# Patient Record
Sex: Male | Born: 1955 | Race: Black or African American | Hispanic: No | Marital: Married | Smoking: Former smoker
Health system: Southern US, Community
[De-identification: ages and names within clinical notes are randomized; demographics above are authoritative.]

## PROBLEM LIST (undated history)

## (undated) DIAGNOSIS — I219 Acute myocardial infarction, unspecified: Secondary | ICD-10-CM

## (undated) DIAGNOSIS — G4730 Sleep apnea, unspecified: Secondary | ICD-10-CM

## (undated) DIAGNOSIS — I10 Essential (primary) hypertension: Secondary | ICD-10-CM

## (undated) DIAGNOSIS — I499 Cardiac arrhythmia, unspecified: Secondary | ICD-10-CM

## (undated) DIAGNOSIS — I2510 Atherosclerotic heart disease of native coronary artery without angina pectoris: Secondary | ICD-10-CM

## (undated) DIAGNOSIS — K922 Gastrointestinal hemorrhage, unspecified: Secondary | ICD-10-CM

## (undated) DIAGNOSIS — I4892 Unspecified atrial flutter: Secondary | ICD-10-CM

## (undated) DIAGNOSIS — Z7901 Long term (current) use of anticoagulants: Secondary | ICD-10-CM

## (undated) DIAGNOSIS — I4891 Unspecified atrial fibrillation: Secondary | ICD-10-CM

## (undated) HISTORY — PX: CERVICAL FUSION: SHX112

## (undated) HISTORY — PX: TONSILLECTOMY: SUR1361

## (undated) HISTORY — DX: Long term (current) use of anticoagulants: Z79.01

## (undated) HISTORY — PX: CORONARY ARTERY BYPASS GRAFT: SHX141

## (undated) HISTORY — PX: BACK SURGERY: SHX140

## (undated) HISTORY — PX: CARDIAC SURGERY: SHX584

## (undated) HISTORY — DX: Unspecified atrial flutter: I48.92

---
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
## 2011-06-01 HISTORY — PX: CORONARY ARTERY BYPASS GRAFT: SHX141

## 2016-07-12 ENCOUNTER — Emergency Department (HOSPITAL_COMMUNITY)
Admission: EM | Admit: 2016-07-12 | Discharge: 2016-07-12 | Disposition: A | Discharge status: Home / Self Care | Payer: Non-veteran care | Attending: Emergency Medicine | Admitting: Emergency Medicine

## 2016-07-12 ENCOUNTER — Emergency Department (HOSPITAL_COMMUNITY): Payer: Non-veteran care

## 2016-07-12 ENCOUNTER — Encounter (HOSPITAL_COMMUNITY): Payer: Self-pay | Admitting: Emergency Medicine

## 2016-07-12 DIAGNOSIS — I4891 Unspecified atrial fibrillation: Secondary | ICD-10-CM | POA: Diagnosis not present

## 2016-07-12 DIAGNOSIS — I2510 Atherosclerotic heart disease of native coronary artery without angina pectoris: Secondary | ICD-10-CM | POA: Insufficient documentation

## 2016-07-12 DIAGNOSIS — F17200 Nicotine dependence, unspecified, uncomplicated: Secondary | ICD-10-CM | POA: Insufficient documentation

## 2016-07-12 DIAGNOSIS — R002 Palpitations: Secondary | ICD-10-CM | POA: Diagnosis present

## 2016-07-12 DIAGNOSIS — I10 Essential (primary) hypertension: Secondary | ICD-10-CM | POA: Diagnosis not present

## 2016-07-12 HISTORY — DX: Atherosclerotic heart disease of native coronary artery without angina pectoris: I25.10

## 2016-07-12 HISTORY — DX: Essential (primary) hypertension: I10

## 2016-07-12 HISTORY — DX: Unspecified atrial fibrillation: I48.91

## 2016-07-12 LAB — BASIC METABOLIC PANEL
Anion gap: 9 (ref 5–15)
BUN: 16 mg/dL (ref 6–20)
CHLORIDE: 107 mmol/L (ref 101–111)
CO2: 23 mmol/L (ref 22–32)
CREATININE: 1.23 mg/dL (ref 0.61–1.24)
Calcium: 9.2 mg/dL (ref 8.9–10.3)
GFR calc Af Amer: >60 mL/min (ref >60)
GFR calc non Af Amer: >60 mL/min (ref >60)
GLUCOSE: 129 mg/dL — AB (ref 65–99)
POTASSIUM: 4.1 mmol/L (ref 3.5–5.1)
SODIUM: 139 mmol/L (ref 135–145)

## 2016-07-12 LAB — I-STAT TROPONIN, ED: Troponin i, poc: 0 ng/mL (ref 0.00–0.08)

## 2016-07-12 LAB — CBC
HEMATOCRIT: 42.8 % (ref 39.0–52.0)
Hemoglobin: 14.5 g/dL (ref 13.0–17.0)
MCH: 30.1 pg (ref 26.0–34.0)
MCHC: 33.9 g/dL (ref 30.0–36.0)
MCV: 89 fL (ref 78.0–100.0)
PLATELETS: 178 10*3/uL (ref 150–400)
RBC: 4.81 MIL/uL (ref 4.22–5.81)
RDW: 14.4 % (ref 11.5–15.5)
WBC: 6.5 10*3/uL (ref 4.0–10.5)

## 2016-07-12 MED ORDER — PROPOFOL 10 MG/ML IV BOLUS: 0.5000 mg/kg | Freq: Once | INTRAVENOUS | Status: DC

## 2016-07-12 MED ORDER — PROPOFOL 10 MG/ML IV BOLUS
INTRAVENOUS | Status: AC | PRN
  Administered 2016-07-12: 10 mg via INTRAVENOUS

## 2016-07-12 MED ORDER — PROPOFOL 10 MG/ML IV BOLUS
INTRAVENOUS | Status: AC | PRN
  Administered 2016-07-12: 60 mg via INTRAVENOUS

## 2016-07-12 MED ORDER — DEXTROSE 5 % IV SOLN
10.0000 mg/h | Freq: Once | INTRAVENOUS | Status: AC
  Administered 2016-07-12: 10 mg/h via INTRAVENOUS
  Filled 2016-07-12 (×2): qty 100

## 2016-07-12 MED ORDER — DILTIAZEM LOAD VIA INFUSION
10.0000 mg | Freq: Once | INTRAVENOUS | Status: AC
  Administered 2016-07-12: 10 mg via INTRAVENOUS

## 2016-07-12 MED ORDER — PROPOFOL 10 MG/ML IV BOLUS
INTRAVENOUS | Status: AC
  Filled 2016-07-12: qty 20

## 2016-07-12 NOTE — ED Triage Notes (Signed)
Pt here for palpitations with afib this am; pt sts SOB and hx of same

## 2016-07-12 NOTE — Discharge Instructions (Signed)
Today you were found to be in atrial fibrillation and you were shocked back to a regular rhythm. Continue taking all your medications including your blood thinner. Return if you develop any shortness of breath, chest pain, swelling or other concerns. Return if you go back out of rhythm.

## 2016-07-12 NOTE — ED Notes (Signed)
Patient is completely back to baseline.  Will continue to monitor the patient and cardiac rhythm.

## 2016-07-12 NOTE — ED Provider Notes (Signed)
[facility] DEPT Provider Note   CSN: [id number] Arrival date & time: 07/12/16  1428     History   Chief Complaint Chief Complaint  Patient presents with  . Palpitations    HPI Cameron Schultz is a 61 y.o. male.  Patient is a 61 year old male with a history of hypertension, coronary artery disease and paroxysmal atrial fibrillation who is normally in a sinus rhythm but does take Xarelto daily presenting today with palpitations. Patient states about 10:30 when he got out of the shower he started feeling bad. He was lightheaded mildly short of breath which persisted throughout the day. He checked his heart rate when he started having symptoms and it was going between 50 and 150 and was irregular. Patient recently moved here from [place] and has had to go to the hospital twice in the past for cardioversion. Based on patient's statement he is converted with Cardizem both times and never required a shock. He denies any chest pain today, recent illness or change in medications. He has taken all of his medications today including Cardizem. He denies any unilateral leg pain or swelling.  Patient denies any drug use but does use tobacco   The history is provided by the patient and the spouse.    Past Medical History:  Diagnosis Date  . Atrial fibrillation (HCC)   . Coronary artery disease   . Hypertension     There are no active problems to display for this patient.   History reviewed. No pertinent surgical history.     Home Medications    Prior to Admission medications   Not on File    Family History History reviewed. No pertinent family history.  Social History Social History  Substance Use Topics  . Smoking status: Current Every Day Smoker  . Smokeless tobacco: Never Used  . Alcohol use No     Allergies   Patient has no known allergies.   Review of Systems Review of Systems  All other systems reviewed and are negative.    Physical Exam Updated Vital  Signs BP 110/89   Pulse 85   Temp 97.8 F (36.6 C) (Oral)   Resp 25   SpO2 94%   Physical Exam  Constitutional: He is oriented to person, place, and time. He appears well-developed and well-nourished. No distress.  HENT:  Head: Normocephalic and atraumatic.  Mouth/Throat: Oropharynx is clear and moist.  Eyes: Conjunctivae and EOM are normal. Pupils are equal, round, and reactive to light.  Neck: Normal range of motion. Neck supple.  Cardiovascular: Intact distal pulses.  An irregularly irregular rhythm present. Tachycardia present.   No murmur heard. Pulmonary/Chest: Effort normal and breath sounds normal. No respiratory distress. He has no wheezes. He has no rales.  Abdominal: Soft. He exhibits no distension. There is no tenderness. There is no rebound and no guarding.  Musculoskeletal: Normal range of motion. He exhibits no edema or tenderness.  No calf pain or swelling  Neurological: He is alert and oriented to person, place, and time.  Skin: Skin is warm and dry. No rash noted. No erythema.  Psychiatric: He has a normal mood and affect. His behavior is normal.  Nursing note and vitals reviewed.    ED Treatments / Results  Labs (all labs ordered are listed, but only abnormal results are displayed) Labs Reviewed  BASIC METABOLIC PANEL - Abnormal; Notable for the following:       Result Value   Glucose, Bld 129 (*)    All other  components within normal limits  CBC  I-STAT TROPOININ, ED    EKG  EKG Interpretation  Date/Time:  Monday July 12 2016 14:40:12 EST Ventricular Rate:  145 PR Interval:    QRS Duration: 84 QT Interval:  304 QTC Calculation: 472 R Axis:     Text Interpretation:  Atrial fibrillation with rapid ventricular response Inferior infarct , age undetermined Abnormal ECG No previous tracing Confirmed by Anitra LauthPLUNKETT  MD, Kaylub Detienne ([id number]) on 07/12/2016 5:51:00 PM       Radiology Dg Chest 2 View  Result Date: 07/12/2016 CLINICAL DATA:  Chest  palpitations and shortness of breath this morning. EXAM: CHEST  2 VIEW COMPARISON:  None. FINDINGS: The patient is status post CABG. Heart size is upper normal. Lungs are clear. No pneumothorax or pleural fluid. No bony abnormality. IMPRESSION: No acute disease. Electronically Signed   By: Drusilla Kannerhomas  Dalessio M.D.   On: 07/12/2016 15:09    Procedures .Cardioversion Date/Time: 07/12/2016 11:35 PM Performed by: Gwyneth SproutPLUNKETT, Deontae Robson Authorized by: Gwyneth SproutPLUNKETT, Drevion Offord   Consent:    Consent obtained:  Verbal and written   Consent given by:  Patient   Risks discussed:  Induced arrhythmia and pain   Alternatives discussed:  Rate-control medication Pre-procedure details:    Cardioversion basis:  Elective   Rhythm:  Atrial fibrillation   Electrode placement:  Anterior-lateral Attempt one:    Cardioversion mode:  Synchronous   Waveform:  Biphasic   Shock (Joules):  120   Shock outcome:  Conversion to normal sinus rhythm Post-procedure details:    Patient status:  Awake   Patient tolerance of procedure:  Tolerated well, no immediate complications   (including critical care time)  Medications Ordered in ED Medications  propofol (DIPRIVAN) 10 mg/mL bolus/IV push 62.4 mg (62.4 mg Intravenous Not Given 07/12/16 2100)  diltiazem (CARDIZEM) 100 mg in dextrose 5 % 100 mL (1 mg/mL) infusion (0 mg/hr Intravenous Stopped 07/12/16 2110)  diltiazem (CARDIZEM) 1 mg/mL load via infusion 10 mg (10 mg Intravenous Bolus from Bag 07/12/16 1943)  propofol (DIPRIVAN) 10 mg/mL bolus/IV push (60 mg Intravenous Given 07/12/16 2118)  propofol (DIPRIVAN) 10 mg/mL bolus/IV push (10 mg Intravenous Given 07/12/16 2119)  propofol (DIPRIVAN) 10 mg/mL bolus/IV push (10 mg Intravenous Given 07/12/16 2120)     Initial Impression / Assessment and Plan / ED Course  I have reviewed the triage vital signs and the nursing notes.  Pertinent labs & imaging results that were available during my care of the patient were reviewed by me and  considered in my medical decision making (see chart for details).    Patient is a 61 year old presenting today with A. fib RVR. Patient has a history of paroxysmal A. fib and is already anticoagulated. He has no other symptoms and has not had any exacerbating features such as change in medications or recent illness.  Upon arrival here patient is in atrial fibrillation with RVR with normal labs. He was given IV diltiazem however he would be a candidate for cardioversion. Patient does not have a cardiologist here in town as he recently moved and has not established care.  11:34 PM Patient had not missed any anticoagulation for the last month and speaking with cardiology they agree that he will be a good candidate for cardioversion. Patient was sedated with propofol and after 1 shock he returned to normal sinus rhythm. He is currently asymptomatic and will discharge home. Speaking with the cardiologist he [facility] and they will call him  for follow-up. Patient will continue taking his medications which include xarelto and cardizem/carvedilol.  Patient and family are comfortable with this plan and he was discharged home in good condition.  Procedural sedation Performed by: Gwyneth Sprout Consent: Verbal consent obtained. Risks and benefits: risks, benefits and alternatives were discussed Required items: required blood products, implants, devices, and special equipment available Patient identity confirmed: arm band and provided demographic data Time out: Immediately prior to procedure a "time out" was called to verify the correct patient, procedure, equipment, support staff and site/side marked as required.  Sedation type: moderate (conscious) sedation NPO time confirmed and considedered  Sedatives: PROPOFOL  Physician Time at Bedside: 35  Vitals: Vital signs were monitored during sedation. Cardiac Monitor, pulse oximeter Patient tolerance: Patient tolerated the procedure  well with no immediate complications. Comments: Pt with uneventful recovered. Returned to pre-procedural sedation baseline     Final Clinical Impressions(s) / ED Diagnoses   Final diagnoses:  Atrial fibrillation with RVR [facility])    New Prescriptions Discharge Medication List as of 07/12/2016 10:39 PM       Gwyneth Sprout, MD 07/13/16 [id number]

## 2016-07-12 NOTE — Progress Notes (Signed)
RT at bedside to assist with cardioversion.

## 2016-07-12 NOTE — ED Notes (Signed)
Pt denies CP

## 2016-07-13 ENCOUNTER — Telehealth (HOSPITAL_COMMUNITY): Payer: Self-pay | Admitting: *Deleted

## 2016-07-13 NOTE — Telephone Encounter (Signed)
Pt contacted following recent ED visit for Afib.  Pt has established hx of afib and anticoag.  [facility]  He is having all records transferred from [place].  Pt declines f/u at clinic at this time [facility]

## 2016-12-20 ENCOUNTER — Encounter (HOSPITAL_COMMUNITY): Payer: Self-pay | Admitting: *Deleted

## 2016-12-20 ENCOUNTER — Inpatient Hospital Stay (HOSPITAL_COMMUNITY)
Admission: EM | Admit: 2016-12-20 | Discharge: 2016-12-22 | DRG: 379 | Disposition: A | Discharge status: Home / Self Care | Payer: Non-veteran care | Attending: Family Medicine | Admitting: Family Medicine

## 2016-12-20 DIAGNOSIS — D649 Anemia, unspecified: Secondary | ICD-10-CM | POA: Diagnosis present

## 2016-12-20 DIAGNOSIS — Z981 Arthrodesis status: Secondary | ICD-10-CM

## 2016-12-20 DIAGNOSIS — Z951 Presence of aortocoronary bypass graft: Secondary | ICD-10-CM | POA: Diagnosis not present

## 2016-12-20 DIAGNOSIS — I2510 Atherosclerotic heart disease of native coronary artery without angina pectoris: Secondary | ICD-10-CM | POA: Diagnosis present

## 2016-12-20 DIAGNOSIS — M1990 Unspecified osteoarthritis, unspecified site: Secondary | ICD-10-CM | POA: Diagnosis present

## 2016-12-20 DIAGNOSIS — I10 Essential (primary) hypertension: Secondary | ICD-10-CM | POA: Diagnosis present

## 2016-12-20 DIAGNOSIS — K254 Chronic or unspecified gastric ulcer with hemorrhage: Secondary | ICD-10-CM | POA: Diagnosis present

## 2016-12-20 DIAGNOSIS — Z7901 Long term (current) use of anticoagulants: Secondary | ICD-10-CM

## 2016-12-20 DIAGNOSIS — K922 Gastrointestinal hemorrhage, unspecified: Secondary | ICD-10-CM

## 2016-12-20 DIAGNOSIS — M549 Dorsalgia, unspecified: Secondary | ICD-10-CM | POA: Diagnosis present

## 2016-12-20 DIAGNOSIS — K921 Melena: Secondary | ICD-10-CM | POA: Diagnosis present

## 2016-12-20 DIAGNOSIS — Z7982 Long term (current) use of aspirin: Secondary | ICD-10-CM

## 2016-12-20 DIAGNOSIS — I4891 Unspecified atrial fibrillation: Secondary | ICD-10-CM | POA: Diagnosis present

## 2016-12-20 DIAGNOSIS — E785 Hyperlipidemia, unspecified: Secondary | ICD-10-CM | POA: Diagnosis present

## 2016-12-20 DIAGNOSIS — Z87891 Personal history of nicotine dependence: Secondary | ICD-10-CM | POA: Diagnosis not present

## 2016-12-20 DIAGNOSIS — K5900 Constipation, unspecified: Secondary | ICD-10-CM | POA: Diagnosis present

## 2016-12-20 HISTORY — DX: Gastrointestinal hemorrhage, unspecified: K92.2

## 2016-12-20 LAB — CBC
HCT: 36.3 % — ABNORMAL LOW (ref 39.0–52.0)
Hemoglobin: 12 g/dL — ABNORMAL LOW (ref 13.0–17.0)
MCH: 29.1 pg (ref 26.0–34.0)
MCHC: 33.1 g/dL (ref 30.0–36.0)
MCV: 87.9 fL (ref 78.0–100.0)
PLATELETS: 198 10*3/uL (ref 150–400)
RBC: 4.13 MIL/uL — ABNORMAL LOW (ref 4.22–5.81)
RDW: 14.5 % (ref 11.5–15.5)
WBC: 6.4 10*3/uL (ref 4.0–10.5)

## 2016-12-20 LAB — RAPID URINE DRUG SCREEN, HOSP PERFORMED
Amphetamines: NOT DETECTED
BARBITURATES: NOT DETECTED
Benzodiazepines: NOT DETECTED
Cocaine: NOT DETECTED
OPIATES: NOT DETECTED
TETRAHYDROCANNABINOL: NOT DETECTED

## 2016-12-20 LAB — COMPREHENSIVE METABOLIC PANEL
ALBUMIN: 3.7 g/dL (ref 3.5–5.0)
ALK PHOS: 74 U/L (ref 38–126)
ALT: 29 U/L (ref 17–63)
AST: 29 U/L (ref 15–41)
Anion gap: 8 (ref 5–15)
BILIRUBIN TOTAL: 0.9 mg/dL (ref 0.3–1.2)
BUN: 10 mg/dL (ref 6–20)
CALCIUM: 8.7 mg/dL — AB (ref 8.9–10.3)
CO2: 22 mmol/L (ref 22–32)
CREATININE: 0.9 mg/dL (ref 0.61–1.24)
Chloride: 108 mmol/L (ref 101–111)
GFR calc Af Amer: >60 mL/min (ref >60)
GFR calc non Af Amer: >60 mL/min (ref >60)
GLUCOSE: 92 mg/dL (ref 65–99)
Potassium: 3.9 mmol/L (ref 3.5–5.1)
Sodium: 138 mmol/L (ref 135–145)
Total Protein: 7 g/dL (ref 6.5–8.1)

## 2016-12-20 LAB — PROTIME-INR
INR: 1.16
PROTHROMBIN TIME: 14.9 s (ref 11.4–15.2)

## 2016-12-20 LAB — TYPE AND SCREEN
ABO/RH(D): O POS
ANTIBODY SCREEN: NEGATIVE

## 2016-12-20 LAB — ABO/RH: ABO/RH(D): O POS

## 2016-12-20 LAB — APTT: aPTT: 39 seconds — ABNORMAL HIGH (ref 24–36)

## 2016-12-20 LAB — POC OCCULT BLOOD, ED: Fecal Occult Bld: POSITIVE — AB

## 2016-12-20 MED ORDER — SODIUM CHLORIDE 0.9 % IV SOLN
INTRAVENOUS | Status: DC
  Administered 2016-12-20 – 2016-12-21 (×2): via INTRAVENOUS

## 2016-12-20 MED ORDER — SODIUM CHLORIDE 0.9 % IV SOLN
8.0000 mg/h | INTRAVENOUS | Status: DC
  Administered 2016-12-20 – 2016-12-21 (×2): 8 mg/h via INTRAVENOUS
  Filled 2016-12-20 (×3): qty 80

## 2016-12-20 MED ORDER — DILTIAZEM HCL ER COATED BEADS 120 MG PO CP24
120.0000 mg | ORAL_CAPSULE | Freq: Every day | ORAL | Status: DC
  Administered 2016-12-21 – 2016-12-22 (×2): 120 mg via ORAL
  Filled 2016-12-20 (×2): qty 1

## 2016-12-20 MED ORDER — POLYETHYLENE GLYCOL 3350 17 G PO PACK: 17.0000 g | PACK | Freq: Every day | ORAL | Status: DC | PRN

## 2016-12-20 MED ORDER — SODIUM CHLORIDE 0.9 % IV SOLN: INTRAVENOUS | Status: DC

## 2016-12-20 MED ORDER — SODIUM CHLORIDE 0.9 % IV SOLN
80.0000 mg | Freq: Once | INTRAVENOUS | Status: AC
  Administered 2016-12-20: 18:00:00 80 mg via INTRAVENOUS
  Filled 2016-12-20: qty 80

## 2016-12-20 MED ORDER — ONDANSETRON HCL 4 MG/2ML IJ SOLN: 4.0000 mg | Freq: Four times a day (QID) | INTRAMUSCULAR | Status: DC | PRN

## 2016-12-20 MED ORDER — CYCLOBENZAPRINE HCL 10 MG PO TABS
10.0000 mg | ORAL_TABLET | Freq: Three times a day (TID) | ORAL | Status: DC | PRN
  Administered 2016-12-21 (×2): 10 mg via ORAL
  Filled 2016-12-20 (×2): qty 1

## 2016-12-20 MED ORDER — ACETAMINOPHEN 650 MG RE SUPP: 650.0000 mg | Freq: Four times a day (QID) | RECTAL | Status: DC | PRN

## 2016-12-20 MED ORDER — LISINOPRIL 40 MG PO TABS
40.0000 mg | ORAL_TABLET | Freq: Every day | ORAL | Status: DC
  Administered 2016-12-21 – 2016-12-22 (×2): 40 mg via ORAL
  Filled 2016-12-20 (×2): qty 1

## 2016-12-20 MED ORDER — ONDANSETRON HCL 4 MG PO TABS: 4.0000 mg | ORAL_TABLET | Freq: Four times a day (QID) | ORAL | Status: DC | PRN

## 2016-12-20 MED ORDER — CARVEDILOL 25 MG PO TABS
25.0000 mg | ORAL_TABLET | Freq: Two times a day (BID) | ORAL | Status: DC
  Administered 2016-12-21 – 2016-12-22 (×3): 25 mg via ORAL
  Filled 2016-12-20 (×3): qty 1

## 2016-12-20 MED ORDER — ROSUVASTATIN CALCIUM 20 MG PO TABS
20.0000 mg | ORAL_TABLET | Freq: Every day | ORAL | Status: DC
  Administered 2016-12-21: 20 mg via ORAL
  Filled 2016-12-20: qty 1

## 2016-12-20 MED ORDER — ACETAMINOPHEN 325 MG PO TABS
650.0000 mg | ORAL_TABLET | Freq: Four times a day (QID) | ORAL | Status: DC | PRN
  Administered 2016-12-21 (×2): 650 mg via ORAL
  Filled 2016-12-20 (×2): qty 2

## 2016-12-20 NOTE — Progress Notes (Signed)
Received report from Casey,RN in the ED. 

## 2016-12-20 NOTE — Progress Notes (Signed)
--   No charge note/ patient has not been seen --  - Received page from ER regarding patient for melena of 3 days' duration.  hemoglobin is 12.Normal BUN. Normal blood pressure.   He is currently on anticoagulation. Last dose of Xarelto  was yesterday. - Recommend admission to the hospital. Recommend nothing by mouth past midnight. Continue Protonix drip. - GI will see patient tomorrow morning. Please call us back early if any significant clinical changes.   Kathi DerParag Madalyn Legner MD, FACP 12/20/2016, 4:36 PM  Pager [phone]  If no answer or after 5 PM call [phone]

## 2016-12-20 NOTE — ED Triage Notes (Signed)
Pt is here with black bowel movements and is on blood thinners, xarelto. Pt reports some upper abdominal discomfort. Started on saturday

## 2016-12-20 NOTE — ED Provider Notes (Signed)
[facility] Provider Note   CSN: [id number] Arrival date & time: 12/20/16  1212     History   Chief Complaint Chief Complaint  Patient presents with  . GI Bleeding     HPI   Blood pressure (!) 126/96, pulse (!) 51, temperature 97.7 F (36.5 C), temperature source Oral, resp. rate (!) 108, height 6\' 2"  (1.88 m), weight 128.4 kg (283 lb), SpO2 100 %.  Cameron Schultz is a 61 y.o. male with past medical history significant for CAD (takes daily 81 mg aspirin, last dose this a.m.), hypertension (does not take beta blockers), A. fib (takes her alto, last dose yesterday morning) complaining of irregular melanotic stool onset 3 days ago. States he normally is quite regular but he's been having small and irregular bowel movements described as black and tarry. He denies any abdominal pain, nausea, vomiting, chest pain, shortness of breath, palpitations or syncope. He does not follow with a GI doctor. His last colonoscopy was a proximally 5 years ago in [place]. He doesn't drink alcohol, he was told once that he had hepatitis C but she's been tested subsequently and has tested negative without treatment. He denies any abdominal pain.   [facility]  Past Medical History:  Diagnosis Date  . Atrial fibrillation (HCC)   . Coronary artery disease   . Hypertension     There are no active problems to display for this patient.   Past Surgical History:  Procedure Laterality Date  . CARDIAC SURGERY    . CERVICAL FUSION    . TONSILLECTOMY         Home Medications    Prior to Admission medications   Medication Sig Start Date End Date Taking? Authorizing Provider  acetaminophen (TYLENOL) 500 MG chewable tablet Chew 1,000 mg by mouth every 6 (six) hours as needed for pain.   Yes [provider]  aspirin EC 81 MG tablet Take 81 mg by mouth daily.   Yes [provider]  carvedilol (COREG) 25 MG tablet Take 25 mg by mouth 2 (two) times daily with a meal.    Yes [provider]  cyclobenzaprine (FLEXERIL) 10 MG tablet Take 10 mg by mouth 3 (three) times daily as needed for muscle spasms.   Yes [provider]  diltiazem (DILTIAZEM CD) 120 MG 24 hr capsule Take 120 mg by mouth daily.    Yes [provider]  lactulose, encephalopathy, (CHRONULAC) 10 GM/15ML SOLN Take 10 g by mouth daily as needed (Constipation).   Yes [provider]  lisinopril (PRINIVIL,ZESTRIL) 40 MG tablet Take 40 mg by mouth daily.   Yes [provider]  PRESCRIPTION MEDICATION Inhale into the lungs at bedtime. CPAP   Yes [provider]  rivaroxaban (XARELTO) 20 MG TABS tablet Take 20 mg by mouth daily with breakfast.    Yes [provider]  Rosuvastatin Calcium (CRESTOR PO) Take 20 mg by mouth daily.    Yes [provider]  sildenafil (VIAGRA) 100 MG tablet Take 100 mg by mouth daily as needed for erectile dysfunction.   Yes [provider]  triamcinolone cream (KENALOG) 0.1 % Apply 1 application topically 2 (two) times daily. rash   Yes [provider]    Family History No family history on file.  Social History Social History  Substance Use Topics  . Smoking status: Current Every Day Smoker  . Smokeless tobacco: Never Used  . Alcohol use No     Allergies   Patient  has no known allergies.   Review of Systems Review of Systems  A complete review of systems was obtained and all systems are negative except as noted in the HPI and PMH.   Physical Exam Updated Vital Signs BP 121/62   Pulse (!) 57   Temp 97.7 F (36.5 C) (Oral)   Resp 17   Ht 6\' 2"  (1.88 m)   Wt 128.4 kg (283 lb)   SpO2 99%   BMI 36.34 kg/m   Physical Exam  Constitutional: He is oriented to person, place, and time. He appears well-developed and well-nourished. No distress.  HENT:  Head: Normocephalic and atraumatic.  Mouth/Throat: Oropharynx is clear and moist.  No significant conjunctival pallor  Eyes: Pupils  are equal, round, and reactive to light. Conjunctivae and EOM are normal.  Neck: Normal range of motion.  Cardiovascular: Normal rate, regular rhythm and intact distal pulses.   Pulmonary/Chest: Effort normal and breath sounds normal.  Abdominal: Soft. There is no tenderness.  Musculoskeletal: Normal range of motion.  Neurological: He is alert and oriented to person, place, and time.  Skin: He is not diaphoretic.  Psychiatric: He has a normal mood and affect.  Nursing note and vitals reviewed.    ED Treatments / Results  Labs (all labs ordered are listed, but only abnormal results are displayed) Labs Reviewed  COMPREHENSIVE METABOLIC PANEL - Abnormal; Notable for the following:       Result Value   Calcium 8.7 (*)    All other components within normal limits  CBC - Abnormal; Notable for the following:    RBC 4.13 (*)    Hemoglobin 12.0 (*)    HCT 36.3 (*)    All other components within normal limits  APTT - Abnormal; Notable for the following:    aPTT 39 (*)    All other components within normal limits  POC OCCULT BLOOD, ED - Abnormal; Notable for the following:    Fecal Occult Bld POSITIVE (*)    All other components within normal limits  PROTIME-INR  TYPE AND SCREEN  ABO/RH    EKG  EKG Interpretation None       Radiology No results found.  Procedures Procedures (including critical care time)  Medications Ordered in ED Medications  pantoprazole (PROTONIX) 80 mg in sodium chloride 0.9 % 100 mL IVPB (not administered)  pantoprazole (PROTONIX) 80 mg in sodium chloride 0.9 % 250 mL (0.32 mg/mL) infusion (not administered)  0.9 %  sodium chloride infusion (not administered)     Initial Impression / Assessment and Plan / ED Course  I have reviewed the triage vital signs and the nursing notes.  Pertinent labs & imaging results that were available during my care of the patient were reviewed by me and considered in my medical decision making (see chart for  details).     Vitals:   12/20/16 1655 12/20/16 1700 12/20/16 1730 12/20/16 1800  BP: 135/76 134/75 122/82 121/62  Pulse: (!) 57 (!) 56 (!) 57 (!) 57  Resp: 16 15 17 17   Temp:      TempSrc:      SpO2: 100% 100% 100% 99%  Weight:      Height:        Medications  pantoprazole (PROTONIX) 80 mg in sodium chloride 0.9 % 100 mL IVPB (not administered)  pantoprazole (PROTONIX) 80 mg in sodium chloride 0.9 % 250 mL (0.32 mg/mL) infusion (not administered)  0.9 %  sodium chloride infusion (not administered)  Chuckie Mccathern is 61 y.o. male presenting with Melanotic stool onset 3 days ago. Patient is anticoagulated with Xarelto for A. fib, last dose was yesterday morning. He also takes a daily low-dose aspirin. Abdominal exam is benign. Vital signs reassuring. Patient does not take a beta blocker. Hemoglobin 12. BUN is not elevated however patient has grossly melanotic stool that is fecal occult positive.  Gastroenterology consult from [facility] Dr. Levora Angel appreciated: she states that patient can have clear liquid diet today, cannot do an endoscopy today because he needs to not have Xarelto in the last 48 hours. Advised to make him nothing by mouth after midnight and continue Protonix drip.  Unassigned admission, discussed with family practice Resident Primitivo Gauze who accepts admission.    Final Clinical Impressions(s) / ED Diagnoses   Final diagnoses:  Acute upper GI bleed  Chronic anticoagulation    New Prescriptions New Prescriptions   No medications on file     Lynetta Mare Mardella Layman 12/20/16 Boykin Reaper, MD 12/20/16 [phone]

## 2016-12-20 NOTE — H&P (Signed)
Family Medicine Teaching [facility] Admission History and Physical Service Pager: [phone]  Patient name: Cameron Schultz Medical record number: [id number] Date of birth: September 05, 1955 Age: 61 y.o. Gender: male  Primary Care Provider: System, Pcp Not In Consultants: GI Code Status: Full (per discussion on admission)   Chief Complaint: melanous stools  Assessment and Plan: Cameron Schultz is a 61 y.o. male presenting with melanous stools . PMH is significant for A. fib, hypertension, arthritis, CAD, CABG with 1 vessel, and history of tobacco and drug abuse  GI bleed: Hemodynamically stable on admission with pulse of 65, BP 131/58, respiratory rate of 18 with 100% oxygen on room air. Hemoglobin only slightly below normal at 12. FOBT positive. Likely upper GI bleed given the history of melenous stools, no red blood per rectum or hematemesis. Patient does take Xarelto and aspirin for A. fib and CAD which would make him more prone to bleeding. Did not take Xarelto this morning. No personal or family history colon cancer or gastric ulcers per patient report. His brother did pass away at 46 from "dark stools".  -Admit to inpatient, MedSurg, attending Dr. Jennette KettleNeal -Vital signs per floor protocol -Clear liquid diet for now and then nothing by mouth after midnight -IV Protonix -Repeat CBC in the morning -Hold all anticoagulation, SCDs for DVT prophylaxis -Transfusion threshold hemoglobin <8 due to cardiac history -GI consulted, appreciate their assistance. They will see patient in the morning  A-Fib: Currently normal sinus rhythm. Has been taking xarelto for ~ 2 years.  -Continue home diltiazem 120 mg daily -Holding all anticoagulation as above  Hypertension: Normotensive on admission -Continue home lisinopril 40 mg daily -Continue home carvedilol 25 mg twice a day  History of tobacco and substance abuse: Patient states he recently quit smoking cigarettes, alcohol, and cocaine 4 months  ago. -UDS  History of CABG:  -Continue home antihypertensives as above -Holding aspirin as above  -continue home statin  Back pain: Patient notes that he has had a cervical spine fusion -Continue home Flexeril  FEN/GI: mIVF at 125cc/hr, clear liquid diet  Prophylaxis: SCDs  Disposition: Admit to inpatient, med surg, attending Dr. Jennette KettleNeal  History of Present Illness:  Cameron Schultz is a 61 y.o. male presenting with melanous bowel movements.   Patient notes that over the last 3 days he developed black tarry stools. He had only 1 today. No nausea, vomiting, abdominal pain, diarrhea, heartburn, dyspepsia. Admits to some constipation and has been taking a laxative (lactulose) PRN. Patient denies any fevers, chills, diaphoresis. He is on Xarelto and has been on this for about 2 years, he is taking it for A-fib. He has not taken his Xarelto today because he was worried that it was causing him to bleed, the last time he took it was yesterday morning. He takes ASA daily. Patient states he has had a colonoscopy before at the [facility] in [place], this was around 3-4 years ago and he states it was normal.  At baseline, patient performs all his ADLs and IADLs independently.   Review Of Systems: Per HPI with the following additions: see HPI  ROS  Patient Active Problem List   Diagnosis Date Noted  . GI bleed 12/20/2016    Past Medical History: Past Medical History:  Diagnosis Date  . Atrial fibrillation (HCC)   . Coronary artery disease   . Hypertension   CABG 5 years ago- 1 vessel  Arthritis Hyperlipidemia  Past Surgical History: Past Surgical History:  Procedure Laterality Date  . CARDIAC SURGERY    .  CERVICAL FUSION    . TONSILLECTOMY      Social History: Quit smoking April 2018, before that he had been smoking for 44 years (1 PPD)  States he drank alcohol but stopped in April  Previously used cocaine about 4 months ago  Please also refer to relevant sections of EMR.  Family  History: No family history on file. Brother had upper GI bleed and died from this at age 19  Allergies and Medications: No Known Allergies No current facility-administered medications on file prior to encounter.    Current Outpatient Prescriptions on File Prior to Encounter  Medication Sig Dispense Refill  . aspirin EC 81 MG tablet Take 81 mg by mouth daily.    . carvedilol (COREG) 25 MG tablet Take 25 mg by mouth 2 (two) times daily with a meal.     . diltiazem (DILTIAZEM CD) 120 MG 24 hr capsule Take 120 mg by mouth daily.     Marland Kitchen lisinopril (PRINIVIL,ZESTRIL) 40 MG tablet Take 40 mg by mouth daily.    Marland Kitchen PRESCRIPTION MEDICATION Inhale into the lungs at bedtime. CPAP    . rivaroxaban (XARELTO) 20 MG TABS tablet Take 20 mg by mouth daily with breakfast.     . Rosuvastatin Calcium (CRESTOR PO) Take 20 mg by mouth daily.       Objective: BP (!) 131/58 (BP Location: Left Arm)   Pulse 65   Temp 98.3 F (36.8 C) (Oral)   Resp 18   Ht 6\' 2"  (1.88 m)   Wt 283 lb (128.4 kg)   SpO2 100%   BMI 36.34 kg/m  Exam: General: In no acute distress, sitting up in bed watching TV Eyes: Pupils equal and reactive to light, nonicteric sclera ENTM: Moist mucous membranes, no lymphadenopathy Neck: Supple Cardiovascular: Regular rate and rhythm, normal S1-S2, no murmurs appreciated, 2+ DP pulses bilaterally, no edema Respiratory: Normal work of breathing, clear to auscultation bilaterally Gastrointestinal: Soft, obese abdomen, nontender, normal bowel sounds MSK: Moves all extremities spontaneously Derm: No rash Neuro: Alert and oriented 3, cranial nerves II through XII intact, sensation grossly intact throughout, 5 out of 5 strength in bilateral upper and lower extremities Psych: Normal mood and affect  Labs and Imaging: CBC BMET   Recent Labs Lab 12/20/16 1235  WBC 6.4  HGB 12.0*  HCT 36.3*  PLT 198    Recent Labs Lab 12/20/16 1235  NA 138  K 3.9  CL 108  CO2 22  BUN 10   CREATININE 0.90  GLUCOSE 92  CALCIUM 8.7*      Beaulah Dinning, MD 12/20/2016, 10:28 PM PGY-3, [facility] FPTS Intern pager: [phone], text pages welcome

## 2016-12-20 NOTE — ED Notes (Signed)
Attempted report x1. 

## 2016-12-21 ENCOUNTER — Inpatient Hospital Stay (HOSPITAL_COMMUNITY): Payer: Non-veteran care | Admitting: Anesthesiology

## 2016-12-21 ENCOUNTER — Encounter (HOSPITAL_COMMUNITY)
Admission: EM | Disposition: A | Discharge status: Home / Self Care | Payer: Self-pay | Source: Home / Self Care | Attending: Family Medicine

## 2016-12-21 ENCOUNTER — Encounter (HOSPITAL_COMMUNITY): Payer: Self-pay

## 2016-12-21 HISTORY — PX: ESOPHAGOGASTRODUODENOSCOPY (EGD) WITH PROPOFOL: SHX5813

## 2016-12-21 LAB — BASIC METABOLIC PANEL
ANION GAP: 4 — AB (ref 5–15)
BUN: 7 mg/dL (ref 6–20)
CHLORIDE: 109 mmol/L (ref 101–111)
CO2: 27 mmol/L (ref 22–32)
Calcium: 8.4 mg/dL — ABNORMAL LOW (ref 8.9–10.3)
Creatinine, Ser: 0.89 mg/dL (ref 0.61–1.24)
GFR calc Af Amer: >60 mL/min (ref >60)
GFR calc non Af Amer: >60 mL/min (ref >60)
GLUCOSE: 88 mg/dL (ref 65–99)
POTASSIUM: 3.6 mmol/L (ref 3.5–5.1)
Sodium: 140 mmol/L (ref 135–145)

## 2016-12-21 LAB — HIV ANTIBODY (ROUTINE TESTING W REFLEX): HIV SCREEN 4TH GENERATION: NONREACTIVE

## 2016-12-21 LAB — CBC
HEMATOCRIT: 33.5 % — AB (ref 39.0–52.0)
HEMOGLOBIN: 11.4 g/dL — AB (ref 13.0–17.0)
MCH: 30 pg (ref 26.0–34.0)
MCHC: 34 g/dL (ref 30.0–36.0)
MCV: 88.2 fL (ref 78.0–100.0)
Platelets: 172 10*3/uL (ref 150–400)
RBC: 3.8 MIL/uL — ABNORMAL LOW (ref 4.22–5.81)
RDW: 14.5 % (ref 11.5–15.5)
WBC: 5.1 10*3/uL (ref 4.0–10.5)

## 2016-12-21 LAB — PROTIME-INR
INR: 1.23
Prothrombin Time: 15.6 seconds — ABNORMAL HIGH (ref 11.4–15.2)

## 2016-12-21 LAB — APTT: aPTT: 34 seconds (ref 24–36)

## 2016-12-21 SURGERY — ESOPHAGOGASTRODUODENOSCOPY (EGD) WITH PROPOFOL
Anesthesia: Monitor Anesthesia Care

## 2016-12-21 MED ORDER — FENTANYL CITRATE (PF) 100 MCG/2ML IJ SOLN: 25.0000 ug | INTRAMUSCULAR | Status: DC | PRN

## 2016-12-21 MED ORDER — LACTATED RINGERS IV SOLN
INTRAVENOUS | Status: DC
  Administered 2016-12-21: 1000 mL via INTRAVENOUS

## 2016-12-21 MED ORDER — PANTOPRAZOLE SODIUM 40 MG PO TBEC
40.0000 mg | DELAYED_RELEASE_TABLET | Freq: Every day | ORAL | Status: DC
  Administered 2016-12-21 – 2016-12-22 (×2): 40 mg via ORAL
  Filled 2016-12-21 (×2): qty 1

## 2016-12-21 MED ORDER — ONDANSETRON HCL 4 MG/2ML IJ SOLN: 4.0000 mg | Freq: Once | INTRAMUSCULAR | Status: DC | PRN

## 2016-12-21 MED ORDER — PROPOFOL 500 MG/50ML IV EMUL
INTRAVENOUS | Status: DC | PRN
  Administered 2016-12-21: 150 ug/kg/min via INTRAVENOUS

## 2016-12-21 MED ORDER — RIVAROXABAN 20 MG PO TABS
20.0000 mg | ORAL_TABLET | Freq: Every day | ORAL | Status: DC
  Administered 2016-12-21 – 2016-12-22 (×2): 20 mg via ORAL
  Filled 2016-12-21 (×2): qty 1

## 2016-12-21 MED ORDER — BUTAMBEN-TETRACAINE-BENZOCAINE 2-2-14 % EX AERO
INHALATION_SPRAY | CUTANEOUS | Status: DC | PRN
  Administered 2016-12-21: 2 via TOPICAL

## 2016-12-21 MED ORDER — PROPOFOL 10 MG/ML IV BOLUS
INTRAVENOUS | Status: DC | PRN
  Administered 2016-12-21: 20 mg via INTRAVENOUS
  Administered 2016-12-21: 70 mg via INTRAVENOUS

## 2016-12-21 MED ORDER — SODIUM CHLORIDE 0.9 % IV SOLN: INTRAVENOUS | Status: DC

## 2016-12-21 SURGICAL SUPPLY — 14 items

## 2016-12-21 NOTE — Discharge Summary (Signed)
Family Medicine Teaching [facility] Discharge Summary  Patient name: Cameron Schultz Medical record number: [id number] Date of birth: [birth year] Age: 61 y.o. Gender: male Date of Admission: 12/20/2016  Date of Discharge: 12/22/2016 Admitting Physician: Nestor Ramp, MD  Primary Care Provider: System, Pcp Not In Consultants: GI  Indication for Hospitalization: GI bleed   Discharge Diagnoses/Problem List:  GI bleed A fib HTN History of tobacco and substance abuse History of CABG Back pain  Disposition: to home   Discharge Condition: stable, improved   Discharge Exam:  General: awake and alert, NAD Cardiovascular: RRR, no MRG Respiratory: CTAB, no wheezes, rales, or rhonchi  Abdomen: soft, non tender, non distended, bowel sounds x 4 quadrants  Extremities: no edema, non tenderness   Brief Hospital Course:  Cameron Schultz is a 62 y.o. male presenting with melanous stools . PMH is significant for A. fib, hypertension, arthritis, CAD, CABG with 1 vessel, and history of tobacco and drug abuse. Patient has history of 2 year Xarelto use along with aspirin for history of A fib and CAD, making patient more prone to bleeding. On admission hemoglobin slightly below normal at 12, and remained stable throughout admission with hemoglobin on discharge of 10.6 Patient remained hemodynamically stable throughout admission. GI was consulted and performed an EGD. EGD on 7/24 showing non bleeding gastric ulcer, normal duodenal bulb, first portion of the duodenum and second portion of the duodenum, and gastritis that was biopsied. GI recommendations to start cardiac diet and resume Xarelto. Patient transitioned from IV to oral protonix. Patient has history of A fib but was in normal sinus rhythm on admission. Home diltiazem was continued and Xarelto was re started per GI recommendations. Patient has history of HTN but was normotensive on admission. Home medications of lisinopril and carvedilol were  continued. Patient has history of cervical spine fusion causing back pain, but this was well controlled with home flexeril dose and tylenol prn. Patient was improved on discharge. Denied SOB, weakness, fatigue, or chest pain. Patient desired to be discharged.   Issues for Follow Up:  1. Will need hospital follow up for GI bleed with PCP  2. Follow up with GI for pathology results from EGD 3. EGD on 7/24 showing non bleeding gastric ulcer, normal duodenal bulb, first portion of the duodenum and second portion of the duodenum, and gastritis that was biopsied.  4. Have started oral protonix 40 mg daily  Significant Procedures: EGD   Significant Labs and Imaging:   Recent Labs Lab 12/20/16 1235 12/21/16 0600 12/22/16 0359  WBC 6.4 5.1 5.8  HGB 12.0* 11.4* 10.6*  HCT 36.3* 33.5* 32.0*  PLT 198 172 184    Recent Labs Lab 12/20/16 1235 12/21/16 0600 12/22/16 0359  NA 138 140 139  K 3.9 3.6 3.5  CL 108 109 108  CO2 22 27 24   GLUCOSE 92 88 94  BUN 10 7 9   CREATININE 0.90 0.89 0.92  CALCIUM 8.7* 8.4* 8.3*  ALKPHOS 74  --   --   AST 29  --   --   ALT 29  --   --   ALBUMIN 3.7  --   --     Drugs of Abuse     Component Value Date/Time   LABOPIA NONE DETECTED 12/20/2016 2002   COCAINSCRNUR NONE DETECTED 12/20/2016 2002   LABBENZ NONE DETECTED 12/20/2016 2002   AMPHETMU NONE DETECTED 12/20/2016 2002   THCU NONE DETECTED 12/20/2016 2002   LABBARB NONE DETECTED 12/20/2016 2002  Results/Tests Pending at Time of Discharge: none  Discharge Medications:  Allergies as of 12/22/2016   No Known Allergies     Medication List    TAKE these medications   acetaminophen 500 MG chewable tablet Commonly known as:  TYLENOL Chew 1,000 mg by mouth every 6 (six) hours as needed for pain.   aspirin EC 81 MG tablet Take 81 mg by mouth daily.   carvedilol 25 MG tablet Commonly known as:  COREG Take 25 mg by mouth 2 (two) times daily with a meal.   CRESTOR PO Take 20 mg by  mouth daily.   cyclobenzaprine 10 MG tablet Commonly known as:  FLEXERIL Take 10 mg by mouth 3 (three) times daily as needed for muscle spasms.   DILTIAZEM CD 120 MG 24 hr capsule Generic drug:  diltiazem Take 120 mg by mouth daily.   lactulose (encephalopathy) 10 GM/15ML Soln Commonly known as:  CHRONULAC Take 10 g by mouth daily as needed (Constipation).   lisinopril 40 MG tablet Commonly known as:  PRINIVIL,ZESTRIL Take 40 mg by mouth daily.   pantoprazole 40 MG tablet Commonly known as:  PROTONIX Take 1 tablet (40 mg total) by mouth daily.   PRESCRIPTION MEDICATION Inhale into the lungs at bedtime. CPAP   sildenafil 100 MG tablet Commonly known as:  VIAGRA Take 100 mg by mouth daily as needed for erectile dysfunction.   triamcinolone cream 0.1 % Commonly known as:  KENALOG Apply 1 application topically 2 (two) times daily. rash   XARELTO 20 MG Tabs tablet Generic drug:  rivaroxaban Take 20 mg by mouth daily with breakfast.       Discharge Instructions: Please refer to Patient Instructions section of EMR for full details.  Patient was counseled important signs and symptoms that should prompt return to medical care, changes in medications, dietary instructions, activity restrictions, and follow up appointments.   Follow-Up Appointments: Follow-up Information    [facility] day([facility]   Specialty:  General Practice Why:  [facility] Contact information: [address] [place] [phone]           Oralia ManisAbraham, Gabbi Whetstone, DO 12/22/2016, 10:20 PM PGY-1, [facility]

## 2016-12-21 NOTE — Op Note (Signed)
[facility] Patient Name: Cameron PloverRichard Schultz Procedure Date : 12/21/2016 MRN: [id number] Attending MD: Kathi DerParag Osa Fogarty , MD Date of Birth: 06/18/1955 CSN: [id number] Age: 61 Admit Type: Inpatient Procedure:                Upper GI endoscopy Indications:              Melena Providers:                Kathi DerParag Mykelle Cockerell, MD, Tillie Fantasiaonna Pickering, RN, Beryle BeamsJanie                            Billups, Technician, Kirstie MirzaSamantha Turner Referring MD:              Medicines:                Sedation Administered by an Anesthesia Professional Complications:            No immediate complications. Estimated Blood Loss:     Estimated blood loss was minimal. Procedure:                Pre-Anesthesia Assessment:                           - Prior to the procedure, a History and Physical                            was performed, and patient medications and                            allergies were reviewed. The patient's tolerance of                            previous anesthesia was also reviewed. The risks                            and benefits of the procedure and the sedation                            options and risks were discussed with the patient.                            All questions were answered, and informed consent                            was obtained. Prior Anticoagulants: The patient has                            taken Xarelto (rivaroxaban), last dose was 2 days                            prior to procedure. ASA Grade Assessment: III - A                            patient with severe systemic disease. After  reviewing the risks and benefits, the patient was                            deemed in satisfactory condition to undergo the                            procedure.                           After obtaining informed consent, the endoscope was                            passed under direct vision. Throughout the                            procedure, the  patient's blood pressure, pulse, and                            oxygen saturations were monitored continuously. The                            EG-[id number] ([id number]) scope was introduced through the                            mouth, and advanced to the second part of duodenum.                            The upper GI endoscopy was accomplished without                            difficulty. The patient tolerated the procedure                            fairly well. Scope In: Scope Out: Findings:      The Z-line was regular and was found 42 cm from the incisors.      The exam of the esophagus was otherwise normal.      One non-bleeding superficial gastric ulcer was found in the prepyloric       region of the stomach.      The cardia and gastric fundus were normal on retroflexion.      The duodenal bulb, first portion of the duodenum and second portion of       the duodenum were normal.      Scattered mild inflammation characterized by erosions and erythema was       found in the prepyloric region of the stomach. Biopsies were taken with       a cold forceps for histology. Impression:               - Z-line regular, 42 cm from the incisors.                           - Non-bleeding gastric ulcer.                           - Normal duodenal bulb, first portion of  the                            duodenum and second portion of the duodenum.                           - Gastritis. Biopsied. Moderate Sedation:      Moderate (conscious) sedation was personally administered by an       anesthesia professional. The following parameters were monitored: oxygen       saturation, heart rate, blood pressure, and response to care. Recommendation:           - Return patient to hospital ward for ongoing care.                           - Cardiac diet.                           - Continue present medications.                           - Resume Xarelto (rivaroxaban) at prior dose today.                            - Await pathology results. Procedure Code(s):        --- Professional ---                           [id number], Esophagogastroduodenoscopy, flexible,                            transoral; with biopsy, single or multiple Diagnosis Code(s):        --- Professional ---                           K25.9, Gastric ulcer, unspecified as acute or                            chronic, without hemorrhage or perforation                           K29.70, Gastritis, unspecified, without bleeding                           K92.1, Melena (includes Hematochezia) CPT copyright 2016 American Medical Association. All rights reserved. The codes documented in this report are preliminary and upon coder review may  be revised to meet current compliance requirements. Kathi Der, MD Kathi Der, MD 12/21/2016 2:03:02 PM Number of Addenda: 0

## 2016-12-21 NOTE — Progress Notes (Signed)
Family Medicine Teaching Service Daily Progress Note Intern Pager: [phone]  Patient name: Cameron PloverRichard Heroux Medical record number: [id number] Date of birth: [birth year] Age: 61 y.o. Gender: male  Primary Care Provider: System, Pcp Not In Consultants: GI Code Status: Full   Pt Overview and Major Events to Date:  Cameron Schultz is a 61 y.o. Male presenting with melanous stools. Admitted to FPTS on 12/20/2016.   Assessment and Plan: Cameron PloverRichard Lesniewski is a 61 y.o. male presenting with melanous stools . PMH is significant for A. fib, hypertension, arthritis, CAD, CABG with 1 vessel, and history of tobacco and drug abuse  GI bleed Hemodynamically stable on admission with pulse of 65, BP 131/58, respiratory rate of 18 with 100% oxygen on room air. On admission, Hemoglobin only slightly below normal at 12. Current hemoglobin of 11.4, hematocrit 33.5. FOBT positive on admission. Likely upper GI bleed given the history of melenotic stools, no red blood per rectum or hematemesis. Patient was taking Xarelto and aspirin for A. fib and CAD which would make him more prone to bleeding. Did not take Xarelto on 7/23. No personal or family history colon cancer or gastric ulcers per patient report. Brother passed away at age 60 from "dark stools". Patient today states he had one black, tarry bowel movement this morning, but denies frank blood. Patient denies coffee ground emesis or blood in urine. Patient denies dizziness, headache, chest pain, or shortness of breath. GI has planned to see patient this morning, will await their recommendations.  -Vital signs per floor protocol -NPO at midnight per GI recommendations, advance diet per GI recommendations  -appreciate GI recommendations  -IV Protonix -monitor daily CBC -Hold all anticoagulation -Transfusion threshold hemoglobin <8 due to cardiac history  A-Fib Currently normal sinus rhythm. Has been taking xarelto for ~ 2 years. [facility]  -Continue home diltiazem 120 mg daily -Holding all anticoagulation as above  Hypertension Normotensive on admission. Current BP of 122/62 -Continue home lisinopril 40 mg daily -Continue home carvedilol 25 mg twice a day  History of tobacco and substance abuse Patient states he recently quit smoking cigarettes, alcohol, and cocaine 4 months ago. -UDS negative   History of CABG:  [facility]  -Continue home antihypertensives as above -Holding aspirin as above  -continue home statin  Back pain Patient notes that he has had a cervical spine fusion. Per nurse, patient's pain was alleviated with prn tylenol last night.  -Continue home Flexeril -tylenol prn    FEN/GI: NPO PPx: SCDs  Disposition: continue to monitor    Subjective:  Patient today states he had a tarry, black bowel movement this morning with no frank red blood. Patient denies coffee ground emesis, or blood in urine. Patient denies dizziness, headache, chest pain, or shortness of breath. Patient states he was able to ambulate to bathroom and back to bed without difficulty and denies dizziness or shortness of breath with ambulation. [facility] Patient states he has increased pain in neck due to history of cervical spine fusion.  Objective: Temp:  [97.7 F (36.5 C)-98.3 F (36.8 C)] 97.7 F (36.5 C) (07/24 0800) Pulse Rate:  [51-65] 59 (07/24 0800) Resp:  [11-108] 17 (07/24 0800) BP: (118-139)/(58-96) 118/84 (07/24 0800) SpO2:  [99 %-100 %] 99 % (07/24 0800) FiO2 (%):  [0 %] 0 % (07/23 1929) Weight:  [278 lb 14.4 oz (126.5 kg)-287 lb 1.6 oz (130.2 kg)] 278 lb 14.4  oz (126.5 kg) (07/24 1610) Physical Exam: General: awake, alert, and oriented. NAD. Sitting up in bed  Cardiovascular: RRR, no MRG Respiratory: CTAB, no wheezes, rales, or rhonchi  Abdomen: soft, non tender, non distended, bowel sounds x 4 Extremities: no  edema, non tender, 2+ pulses in pedal and radial pulses  Laboratory:  Recent Labs Lab 12/20/16 1235 12/21/16 0600  WBC 6.4 5.1  HGB 12.0* 11.4*  HCT 36.3* 33.5*  PLT 198 172    Recent Labs Lab 12/20/16 1235 12/21/16 0600  NA 138 140  K 3.9 3.6  CL 108 109  CO2 22 27  BUN 10 7  CREATININE 0.90 0.89  CALCIUM 8.7* 8.4*  PROT 7.0  --   BILITOT 0.9  --   ALKPHOS 74  --   ALT 29  --   AST 29  --   GLUCOSE 92 88    Drugs of Abuse     Component Value Date/Time   LABOPIA NONE DETECTED 12/20/2016 2002   COCAINSCRNUR NONE DETECTED 12/20/2016 2002   LABBENZ NONE DETECTED 12/20/2016 2002   AMPHETMU NONE DETECTED 12/20/2016 2002   THCU NONE DETECTED 12/20/2016 2002   LABBARB NONE DETECTED 12/20/2016 2002     Imaging/Diagnostic Tests:   Oralia Manis, DO 12/21/2016, 10:52 AM PGY-1, [place] [facility] FPTS Intern pager: [phone], text pages welcome

## 2016-12-21 NOTE — Transfer of Care (Signed)
Immediate Anesthesia Transfer of Care Note  Patient: Cameron Schultz  Procedure(s) Performed: Procedure(s): ESOPHAGOGASTRODUODENOSCOPY (EGD) WITH PROPOFOL (N/A)  Patient Location: Endoscopy Unit  Anesthesia Type:MAC  Level of Consciousness: drowsy and patient cooperative  Airway & Oxygen Therapy: Patient Spontanous Breathing and Patient connected to nasal cannula oxygen  Post-op Assessment: Report given to RN, Post -op Vital signs reviewed and stable and Patient moving all extremities X 4  Post vital signs: Reviewed and stable  Last Vitals:  Vitals:   12/21/16 0800 12/21/16 1249  BP: 118/84 134/76  Pulse: (!) 59 (!) 58  Resp: 17 16  Temp: 36.5 C 36.6 C    Last Pain:  Vitals:   12/21/16 1249  TempSrc: Oral  PainSc:       Patients Stated Pain Goal: 3 (81/77/11 6579)  Complications: No apparent anesthesia complications

## 2016-12-21 NOTE — Consult Note (Signed)
Referring Provider:  Dr. Nyra Jabs MD  Primary Care Physician:  System, Pcp Not In Primary Gastroenterologist:  Unassigned  Reason for Consultation:  GI bleed  HPI: Cameron Schultz is a 61 y.o. male with past medical history of atrial fibrillation on Xarelto, history of coronary artery disease came into the hospital with black stools. GI is consulted for further evaluation.  Patient seen and examined at bedside. According to patient he had noticed few episodes of black tarry stool 2 months ago. It resolved itself. Patient again started noticing black tarry stool 3-4 days ago. He denied associated nausea or vomiting. Denied any abdominal pain. Denied bright red blood per rectum. Last dose of Xarelto was Sunday morning which is 48 hours ago. Denied any NSAID use  Had colonoscopy 6 years ago at [facility] which was normal according to patient  Past Medical History:  Diagnosis Date  . Atrial fibrillation (HCC)   . Coronary artery disease   . Hypertension     Past Surgical History:  Procedure Laterality Date  . CARDIAC SURGERY    . CERVICAL FUSION    . TONSILLECTOMY      Prior to Admission medications   Medication Sig Start Date End Date Taking? Authorizing Provider  acetaminophen (TYLENOL) 500 MG chewable tablet Chew 1,000 mg by mouth every 6 (six) hours as needed for pain.   Yes [provider]  aspirin EC 81 MG tablet Take 81 mg by mouth daily.   Yes [provider]  carvedilol (COREG) 25 MG tablet Take 25 mg by mouth 2 (two) times daily with a meal.    Yes [provider]  cyclobenzaprine (FLEXERIL) 10 MG tablet Take 10 mg by mouth 3 (three) times daily as needed for muscle spasms.   Yes [provider]  diltiazem (DILTIAZEM CD) 120 MG 24 hr capsule Take 120 mg by mouth daily.    Yes [provider]  lactulose, encephalopathy, (CHRONULAC) 10 GM/15ML SOLN Take 10 g by mouth daily as needed (Constipation).   Yes [provider]  lisinopril (PRINIVIL,ZESTRIL) 40 MG tablet Take 40 mg by mouth daily.   Yes [provider]  PRESCRIPTION MEDICATION Inhale into the lungs at bedtime. CPAP   Yes [provider]  rivaroxaban (XARELTO) 20 MG TABS tablet Take 20 mg by mouth daily with breakfast.    Yes [provider]  Rosuvastatin Calcium (CRESTOR PO) Take 20 mg by mouth daily.    Yes [provider]  sildenafil (VIAGRA) 100 MG tablet Take 100 mg by mouth daily as needed for erectile dysfunction.   Yes [provider]  triamcinolone cream (KENALOG) 0.1 % Apply 1 application topically 2 (two) times daily. rash   Yes [provider]    Scheduled Meds: . carvedilol  25 mg Oral BID WC  . diltiazem  120 mg Oral Daily  . lisinopril  40 mg Oral Daily  . rosuvastatin  20 mg Oral q1800   Continuous Infusions: . sodium chloride 125 mL/hr at 12/21/16 0619  . pantoprozole (PROTONIX) infusion 8 mg/hr (12/21/16 0536)   PRN Meds:.acetaminophen **OR** acetaminophen, cyclobenzaprine, ondansetron **OR** ondansetron (ZOFRAN) IV, polyethylene glycol  Allergies as of 12/20/2016  . (No Known Allergies)    No family history on file.  Social History   Social History  . Marital status: Married    Spouse name: N/A  . Number of children: N/A  . Years of education: N/A   Occupational History  . Not on  file.   Social History Main Topics  . Smoking status: Current Every Day Smoker  . Smokeless tobacco: Never Used  . Alcohol use No  . Drug use: No  . Sexual activity: Not on file   Other Topics Concern  . Not on file   Social History Narrative  . No narrative on file    Review of Systems: Review of Systems  Constitutional: Negative for chills, fever, malaise/fatigue and weight loss.  HENT: Negative for hearing loss and tinnitus.   Eyes: Negative for blurred vision and double vision.  Respiratory: Negative for cough and hemoptysis.   Cardiovascular: Negative  for chest pain and palpitations.  Gastrointestinal: Positive for melena. Negative for abdominal pain, nausea and vomiting.  Genitourinary: Negative for dysuria and urgency.  Musculoskeletal: Negative for myalgias and neck pain.  Skin: Negative for itching and rash.  Neurological: Negative for seizures and loss of consciousness.  Endo/Heme/Allergies: Does not bruise/bleed easily.  Psychiatric/Behavioral: Negative for hallucinations and suicidal ideas.    Physical Exam: Vital signs: Vitals:   12/21/16 0627 12/21/16 0800  BP: 122/62 118/84  Pulse: (!) 54 (!) 59  Resp: 17 17  Temp: 98 F (36.7 C) 97.7 F (36.5 C)   Last BM Date: 12/21/16 (this am per pt. was black in color) Physical Exam  Constitutional: He is oriented to person, place, and time. He appears well-developed and well-nourished. No distress.  HENT:  Head: Normocephalic and atraumatic.  Mouth/Throat: Oropharynx is clear and moist. No oropharyngeal exudate.  Eyes: EOM are normal. No scleral icterus.  Neck: Normal range of motion. Neck supple. No thyromegaly present.  Cardiovascular: Normal rate, regular rhythm and normal heart sounds.   Pulmonary/Chest: Effort normal and breath sounds normal. No respiratory distress.  Abdominal: Soft. Bowel sounds are normal. He exhibits no distension. There is no tenderness. There is no rebound and no guarding.  Musculoskeletal: Normal range of motion. He exhibits no edema.  Neurological: He is alert and oriented to person, place, and time.  Skin: Skin is warm. No erythema.  Psychiatric: He has a normal mood and affect. His behavior is normal. Thought content normal.  Vitals reviewed.   GI:  Lab Results:  Recent Labs  12/20/16 1235 12/21/16 0600  WBC 6.4 5.1  HGB 12.0* 11.4*  HCT 36.3* 33.5*  PLT 198 172   BMET  Recent Labs  12/20/16 1235 12/21/16 0600  NA 138 140  K 3.9 3.6  CL 108 109  CO2 22 27  GLUCOSE 92 88  BUN 10 7  CREATININE 0.90 0.89  CALCIUM 8.7* 8.4*    LFT  Recent Labs  12/20/16 1235  PROT 7.0  ALBUMIN 3.7  AST 29  ALT 29  ALKPHOS 74  BILITOT 0.9   PT/INR  Recent Labs  12/20/16 1235 12/21/16 0600  LABPROT 14.9 15.6*  INR 1.16 1.23     Studies/Results: No results found.  Impression/Plan: - Melena while on Zoraida to.Xarelto. Need to rule out ulcer disease. - Anemia. HgB 11.4.  - Occult blood positive stool. Most likely from upper GI bleed - Atrial fibrillation. Last dose of Xarelto on 07/22/ morning  - Coronary disease status post CABG  Recommendations --------------------------- - Patient Xarelto has been on hold for more than 48 hours. EGD today for further evaluation. Risk benefits alternatives discussed with the patient and family. They verbalized understanding. Continue PPI. Further plan based on endoscopic finding.    LOS: 1 day   Kathi Der  MD, FACP 12/21/2016, 10:59 AM  Pager [phone] If no answer or after 5 PM call [phone]

## 2016-12-21 NOTE — Anesthesia Preprocedure Evaluation (Addendum)
Anesthesia Evaluation  Patient identified by MRN, date of birth, ID band Patient awake    Reviewed: Allergy & Precautions, NPO status , Patient's Chart, lab work & pertinent test results  History of Anesthesia Complications Negative for: history of anesthetic complications  Airway Mallampati: II  TM Distance: >3 FB Neck ROM: Full    Dental  (+) Dental Advisory Given, Missing, Poor Dentition, Chipped,    Pulmonary sleep apnea (4cm H2O) and Continuous Positive Airway Pressure Ventilation , former smoker,    Pulmonary exam normal breath sounds clear to auscultation       Cardiovascular hypertension, Pt. on home beta blockers and Pt. on medications (-) angina+ CAD, + Past MI and + CABG (2013)  Normal cardiovascular exam+ dysrhythmias Atrial Fibrillation  Rhythm:Regular Rate:Normal     Neuro/Psych negative neurological ROS  negative psych ROS   GI/Hepatic Neg liver ROS, Melena    Endo/Other  negative endocrine ROSObesity   Renal/GU negative Renal ROS     Musculoskeletal negative musculoskeletal ROS (+)   Abdominal   Peds  Hematology  (+) Blood dyscrasia (Xarelto), anemia ,   Anesthesia Other Findings Day of surgery medications reviewed with the patient.  Reproductive/Obstetrics                            Anesthesia Physical Anesthesia Plan  ASA: III  Anesthesia Plan: MAC   Post-op Pain Management:    Induction: Intravenous  PONV Risk Score and Plan: 0  Airway Management Planned: Nasal Cannula  Additional Equipment:   Intra-op Plan:   Post-operative Plan:   Informed Consent: I have reviewed the patients History and Physical, chart, labs and discussed the procedure including the risks, benefits and alternatives for the proposed anesthesia with the patient or authorized representative who has indicated his/her understanding and acceptance.   Dental advisory given  Plan  Discussed with: CRNA and Anesthesiologist  Anesthesia Plan Comments: (Discussed risks/benefits/alternatives to MAC sedation including need for ventilatory support, hypotension, need for conversion to general anesthesia.  All patient questions answered.  Patient/guardian wishes to proceed.)       Anesthesia Quick Evaluation

## 2016-12-21 NOTE — Progress Notes (Signed)
Pt IV removed accidentally by Pt; MD notified and she said "It's ok" for pt not not have IV. Dionne BucyP. Amo Edahi Kroening RN

## 2016-12-21 NOTE — Brief Op Note (Signed)
12/20/2016 - 12/21/2016  2:03 PM  PATIENT:  Cameron Schultz  61 y.o. male  PRE-OPERATIVE DIAGNOSIS:  Melena  POST-OPERATIVE DIAGNOSIS:  Small gastric  ulcer   PROCEDURE:  Procedure(s): ESOPHAGOGASTRODUODENOSCOPY (EGD) WITH PROPOFOL (N/A)  SURGEON:  Surgeon(s) and Role:    * Ettamae Barkett, MD - Primary  Findings / recommendations --------------------------------------- - EGD showed small prepyloric superficial ulcer without any active bleeding. It also showed mild gastritis in prepyloric area. - Okay to resume Xarelto  from GI standpoint. - Recommend once a day PPI for at least 8 weeks. Avoid NSAIDs. - Advance diet as tolerated. GI will sign off. Call us back if needed - [facility] after discharge.     Kathi DerParag Deriyah Kunath MD, FACP 12/21/2016, 2:05 PM  Pager [phone]  If no answer or after 5 PM call [phone]

## 2016-12-21 NOTE — Progress Notes (Signed)
Patient states he is wearing his CPAP tonight and can place self on and off. Patient stated he will call if he needs help with anything.

## 2016-12-21 NOTE — Progress Notes (Signed)
NURSING PROGRESS NOTE  Gala MurdochRichard MoaneyMRN: [id number] Admission Data: 12/20/16 at 2030  Attending Provider: Nestor RampNeal, Sara L, MD PCP: System, Pcp Not In Code status: Full  Allergies: No Known Allergies  Past Medical History:  Past Medical History:  Diagnosis Date  . Atrial fibrillation (HCC)   . Coronary artery disease   . Hypertension    Past Surgical History:  Past Surgical History:  Procedure Laterality Date  . CARDIAC SURGERY    . CERVICAL FUSION    . TONSILLECTOMY     Cameron PloverRichard Schultz is a 61 y.o. male patient, arrived to floor in room [id number] via stretcher , transferred from ED. Patient alert and oriented X 4. No acute distress noted. Denies pain.   Vital signs: Oral temperature 98.3 F (36.8 C), Blood pressure 131/58, Pulse 65, RR 18, SpO2 100 % on room air.  IV access: Right AC-protonix drip infusing on arrival from ED; condition patent and no redness.  Skin: intact, no pressure ulcer noted in sacral area. Rash-psoriasis noted on legs and a healed yellow scab noted on right chest. Second verified by Seabron SpatesSarah Elliot,RN  Patient's ID armband verified with patient and in place. Information packet given to patient. Fall risk assessed, SR up X2, patient able to verbalize understanding of risks associated with falls and to call nurse or staff to assist before getting out of bed. Patient oriented to room and equipment. Call bell within reach.

## 2016-12-21 NOTE — Progress Notes (Signed)
Family Medicine Teaching Service Daily Progress Note Intern Pager: [phone]  Patient name: Cameron Schultz Medical record number: [id number] Date of birth: 1956-02-10 Age: 61 y.o. Gender: male  Primary Care Provider: System, Pcp Not In Consultants: GI Code Status: Full   Pt Overview and Major Events to Date:  Wallis Vancott is a 61 y.o. Male presenting with melanous stools. Admitted to FPTS on 12/20/2016.   Assessment and Plan: Bessie Livingood a 61 y.o.malepresenting with melanous stools. PMH is significant for A. fib, hypertension, arthritis, CAD, CABG with 1 vessel, and history of tobacco and drug abuse  GI bleed Hemodynamically stable on admission with pulse of 65, BP 131/58, respiratory rate of 18 with 100% oxygen on room air. On admission, Hemoglobin only slightly below normal at 12. Current hemoglobin of 10.6, hematocrit 32.0. FOBT positive on admission. Likely upper GI bleed given the history of melenoticstools, no red blood per rectum or hematemesis. Patient was taking Xarelto and aspirin for A. fib and CAD which would make him more prone to bleeding. Did not take Xarelto on 7/23. No personal or family history colon cancer or gastric ulcers per patient report. Brother passed away at age 69 from "dark stools". Patient today states he had one black colored bowel movement this morning. States it is not as sticky as previous bowel movements. Underwent EGD per GI recommendations. EGD showing non bleeding gastric ulcer, normal duodenal bulb, first portion of the duodenum and second portion of the duodenum, and gastritis that was biopsied. GI recommendations to start cardiac diet and resume Xarelto. Patient denies SOB, dizziness, chest pain, weakness, or fatigue.  -Vital signs per floor protocol -advance diet per GI recommendations  -appreciate GI recommendations  -protonix 40 mg po daily -monitor daily CBC -continue Xarelto -Transfusion threshold hemoglobin <8 due to cardiac  history  A-Fib Currently normal sinus rhythm. Has been taking xarelto for ~ 2 years. Is followed by Cardiology at [facility].  -Continue home diltiazem 120 mg daily -continue Xarelto   Hypertension Normotensive on admission. Current BP of 149/84. Patient was normotensive at 126/66 earlier this morning.  -Continue home lisinopril 40 mg daily -Continue home carvedilol 25 mg twice a day -continue to monitor   History of tobacco and substance abuse Patient states he recently quit smoking cigarettes, alcohol, and cocaine 4 months ago. States last drink was in April  -UDS negative   History of CABG:  Is followed by Cardiology at [facility].  -Continue home antihypertensives as above -continue home statin  Back pain Patient notes that he has had a cervical spine fusion. Per nurse, patient's pain was alleviated with prn tylenol last night.  -Continue home Flexeril -tylenol prn    FEN/GI: Heart healthy, carb modified  PPx: SCDs  Disposition: discharge home   Subjective:  Patient today states he is feeling ok. Denies dizziness, shortness of breath, chest pain, weakness, and fatigue. Patient states one black colored bowel movement but states it was not as sticky as previous bowel movements. Patient states he is ready for discharge.   Objective: Temp:  [97.7 F (36.5 C)-98.4 F (36.9 C)] 98 F (36.7 C) (07/25 0509) Pulse Rate:  [53-65] 54 (07/25 0509) Resp:  [13-20] 18 (07/25 0509) BP: (102-141)/(65-81) 126/66 (07/25 0509) SpO2:  [94 %-100 %] 99 % (07/25 0509) Weight:  [278 lb (126.1 kg)-285 lb 9.6 oz (129.5 kg)] 285 lb 9.6 oz (129.5 kg) (07/25 0500) Physical Exam: General: awake and alert, NAD Cardiovascular: RRR, no MRG Respiratory: CTAB, no wheezes, rales, or  rhonchi  Abdomen: soft, non tender, non distended, bowel sounds x 4 quadrants  Extremities: no edema, non tenderness   Laboratory:  Recent Labs Lab 12/20/16 1235 12/21/16 0600 12/22/16 0359  WBC  6.4 5.1 5.8  HGB 12.0* 11.4* 10.6*  HCT 36.3* 33.5* 32.0*  PLT 198 172 184    Recent Labs Lab 12/20/16 1235 12/21/16 0600 12/22/16 0359  NA 138 140 139  K 3.9 3.6 3.5  CL 108 109 108  CO2 22 27 24   BUN 10 7 9   CREATININE 0.90 0.89 0.92  CALCIUM 8.7* 8.4* 8.3*  PROT 7.0  --   --   BILITOT 0.9  --   --   ALKPHOS 74  --   --   ALT 29  --   --   AST 29  --   --   GLUCOSE 92 88 94    Drugs of Abuse     Component Value Date/Time   LABOPIA NONE DETECTED 12/20/2016 2002   COCAINSCRNUR NONE DETECTED 12/20/2016 2002   LABBENZ NONE DETECTED 12/20/2016 2002   AMPHETMU NONE DETECTED 12/20/2016 2002   THCU NONE DETECTED 12/20/2016 2002   LABBARB NONE DETECTED 12/20/2016 2002      Imaging/Diagnostic Tests:   Oralia ManisAbraham, Rainee Sweatt, DO 12/22/2016, 11:36 AM PGY-1, [facility] FPTS Intern pager: [phone], text pages welcome

## 2016-12-22 LAB — BASIC METABOLIC PANEL
ANION GAP: 7 (ref 5–15)
BUN: 9 mg/dL (ref 6–20)
CALCIUM: 8.3 mg/dL — AB (ref 8.9–10.3)
CO2: 24 mmol/L (ref 22–32)
Chloride: 108 mmol/L (ref 101–111)
Creatinine, Ser: 0.92 mg/dL (ref 0.61–1.24)
Glucose, Bld: 94 mg/dL (ref 65–99)
Potassium: 3.5 mmol/L (ref 3.5–5.1)
SODIUM: 139 mmol/L (ref 135–145)

## 2016-12-22 LAB — CBC
HCT: 32 % — ABNORMAL LOW (ref 39.0–52.0)
HEMOGLOBIN: 10.6 g/dL — AB (ref 13.0–17.0)
MCH: 28.9 pg (ref 26.0–34.0)
MCHC: 33.1 g/dL (ref 30.0–36.0)
MCV: 87.2 fL (ref 78.0–100.0)
Platelets: 184 10*3/uL (ref 150–400)
RBC: 3.67 MIL/uL — ABNORMAL LOW (ref 4.22–5.81)
RDW: 14.2 % (ref 11.5–15.5)
WBC: 5.8 10*3/uL (ref 4.0–10.5)

## 2016-12-22 MED ORDER — PANTOPRAZOLE SODIUM 40 MG PO TBEC: 40.0000 mg | DELAYED_RELEASE_TABLET | Freq: Every day | ORAL | 0 refills | Status: DC

## 2016-12-22 NOTE — Progress Notes (Signed)
Patient discharged to home at 2:30 pm, via son's car. Discharge instructions provided and patient verbalized understanding. Prescription for Protonix provided. IV was removed prior to current shift. No signs of skin breakdown noted.   Sherlon HandingElisa Aracelli Woloszyn, LPN

## 2016-12-22 NOTE — Anesthesia Postprocedure Evaluation (Signed)
Anesthesia Post Note  Patient: Cameron Schultz  Procedure(s) Performed: Procedure(s) (LRB): ESOPHAGOGASTRODUODENOSCOPY (EGD) WITH PROPOFOL (N/A)     Patient location during evaluation: Endoscopy Anesthesia Type: MAC Level of consciousness: awake and alert Pain management: pain level controlled Vital Signs Assessment: post-procedure vital signs reviewed and stable Respiratory status: spontaneous breathing, nonlabored ventilation, respiratory function stable and patient connected to nasal cannula oxygen Cardiovascular status: stable and blood pressure returned to baseline Anesthetic complications: no Comments: No antiemetics given due to MAC procedure, and no patient complaint of nausea/vomiting.     Last Vitals:  Vitals:   12/21/16 2202 12/22/16 0509  BP: 112/75 126/66  Pulse: 65 (!) 54  Resp: 18 18  Temp: 36.8 C 36.7 C    Last Pain:  Vitals:   12/22/16 0509  TempSrc: Oral  PainSc:                  Catalina Gravel

## 2016-12-22 NOTE — Discharge Instructions (Signed)
Cameron Schultz was admitted for GI bleed. Patient had an EGD which showed a non bleeding gastric ulcer and gastritis that was biopsied.  [facility]  Please follow up with GI for follow up and pathology results.  We have restarted your Xarelto and Aspirin. We have started you on protonix 40mg  daily.

## 2016-12-23 ENCOUNTER — Encounter (HOSPITAL_COMMUNITY): Payer: Self-pay | Admitting: Gastroenterology

## 2017-02-14 ENCOUNTER — Emergency Department (HOSPITAL_COMMUNITY)
Admission: EM | Admit: 2017-02-14 | Discharge: 2017-02-14 | Disposition: A | Discharge status: Home / Self Care | Payer: Non-veteran care | Attending: Emergency Medicine | Admitting: Emergency Medicine

## 2017-02-14 ENCOUNTER — Encounter (HOSPITAL_COMMUNITY): Payer: Self-pay | Admitting: *Deleted

## 2017-02-14 DIAGNOSIS — Z79899 Other long term (current) drug therapy: Secondary | ICD-10-CM | POA: Insufficient documentation

## 2017-02-14 DIAGNOSIS — I10 Essential (primary) hypertension: Secondary | ICD-10-CM | POA: Diagnosis not present

## 2017-02-14 DIAGNOSIS — F17200 Nicotine dependence, unspecified, uncomplicated: Secondary | ICD-10-CM | POA: Diagnosis not present

## 2017-02-14 DIAGNOSIS — K625 Hemorrhage of anus and rectum: Secondary | ICD-10-CM | POA: Diagnosis not present

## 2017-02-14 DIAGNOSIS — I2510 Atherosclerotic heart disease of native coronary artery without angina pectoris: Secondary | ICD-10-CM | POA: Diagnosis not present

## 2017-02-14 DIAGNOSIS — I4891 Unspecified atrial fibrillation: Secondary | ICD-10-CM | POA: Diagnosis not present

## 2017-02-14 DIAGNOSIS — K5903 Drug induced constipation: Secondary | ICD-10-CM | POA: Diagnosis not present

## 2017-02-14 LAB — COMPREHENSIVE METABOLIC PANEL
ALK PHOS: 72 U/L (ref 38–126)
ALT: 32 U/L (ref 17–63)
ANION GAP: 6 (ref 5–15)
AST: 31 U/L (ref 15–41)
Albumin: 3.5 g/dL (ref 3.5–5.0)
BILIRUBIN TOTAL: 0.6 mg/dL (ref 0.3–1.2)
BUN: 9 mg/dL (ref 6–20)
CALCIUM: 8.6 mg/dL — AB (ref 8.9–10.3)
CO2: 24 mmol/L (ref 22–32)
CREATININE: 0.93 mg/dL (ref 0.61–1.24)
Chloride: 107 mmol/L (ref 101–111)
Glucose, Bld: 93 mg/dL (ref 65–99)
Potassium: 3.8 mmol/L (ref 3.5–5.1)
Sodium: 137 mmol/L (ref 135–145)
TOTAL PROTEIN: 7.5 g/dL (ref 6.5–8.1)

## 2017-02-14 LAB — TYPE AND SCREEN
ABO/RH(D): O POS
Antibody Screen: NEGATIVE

## 2017-02-14 LAB — CBC
HCT: 38.5 % — ABNORMAL LOW (ref 39.0–52.0)
HEMOGLOBIN: 12.5 g/dL — AB (ref 13.0–17.0)
MCH: 27.8 pg (ref 26.0–34.0)
MCHC: 32.5 g/dL (ref 30.0–36.0)
MCV: 85.6 fL (ref 78.0–100.0)
PLATELETS: 205 10*3/uL (ref 150–400)
RBC: 4.5 MIL/uL (ref 4.22–5.81)
RDW: 14.2 % (ref 11.5–15.5)
WBC: 6.6 10*3/uL (ref 4.0–10.5)

## 2017-02-14 LAB — POC OCCULT BLOOD, ED: FECAL OCCULT BLD: POSITIVE — AB

## 2017-02-14 MED ORDER — DOCUSATE SODIUM 100 MG PO CAPS: 100.0000 mg | ORAL_CAPSULE | Freq: Two times a day (BID) | ORAL | 0 refills | Status: AC

## 2017-02-14 MED ORDER — SENNA 8.6 MG PO TABS: 2.0000 | ORAL_TABLET | Freq: Two times a day (BID) | ORAL | 0 refills | Status: DC | PRN

## 2017-02-14 NOTE — ED Provider Notes (Signed)
[facility]-EMERGENCY DEPT Provider Note   CSN: [id number] Arrival date & time: 02/14/17  1101     History   Chief Complaint Chief Complaint  Patient presents with  . GI Bleeding    HPI Cameron Schultz is a 61 y.o. male.  HPI   Patient presenting with history of episode of bright red blood per rectum. States that he has been taking Tylenol and codeine. He developed constipation for the past couple of days. The has not had a bowel movement in 2 days and then earlier today had a large bowel movement. Afterwards he felt the need to have a second bowel movement however when he passes gas he had a little blood in the toilet. This blood is not similar to his previous bleeding as it is bright red in color. Patient was seen back in July for dark stools and found to have an upper GI bleed. GI obtained an EGD which was positive for gastritis and patient was later treated for H. pylori as an outpatient. Since then patient has not had any further symptoms associated with bleeding. He denies any palpitations, dizziness, SOB, nausea, vomiting.  Past Medical History:  Diagnosis Date  . Atrial fibrillation (HCC)   . Coronary artery disease   . Hypertension     Patient Active Problem List   Diagnosis Date Noted  . GI bleed 12/20/2016  . Acute upper GI bleed   . Chronic anticoagulation     Past Surgical History:  Procedure Laterality Date  . CARDIAC SURGERY    . CERVICAL FUSION    . ESOPHAGOGASTRODUODENOSCOPY (EGD) WITH PROPOFOL N/A 12/21/2016   Procedure: ESOPHAGOGASTRODUODENOSCOPY (EGD) WITH PROPOFOL;  Surgeon: Kathi Der, MD;  Location: [facility] ENDOSCOPY;  Service: Gastroenterology;  Laterality: N/A;  . TONSILLECTOMY        Home Medications    Prior to Admission medications   Medication Sig Start Date End Date Taking? Authorizing Provider  acetaminophen (TYLENOL) 500 MG chewable tablet Chew 1,000 mg by mouth every 6 (six) hours as needed for pain.   Yes [provider]    acetaminophen-codeine (TYLENOL #3) 300-30 MG tablet Take 1 tablet by mouth 2 (two) times daily as needed for moderate pain.   Yes [provider]  carvedilol (COREG) 25 MG tablet Take 25 mg by mouth 2 (two) times daily with a meal.    Yes [provider]  clotrimazole (LOTRIMIN) 1 % cream Apply 1 application topically 2 (two) times daily.   Yes [provider]  cyclobenzaprine (FLEXERIL) 10 MG tablet Take 10 mg by mouth 3 (three) times daily as needed for muscle spasms.   Yes [provider]  diltiazem (DILTIAZEM CD) 120 MG 24 hr capsule Take 120 mg by mouth daily.    Yes [provider]  lactulose, encephalopathy, (CHRONULAC) 10 GM/15ML SOLN Take 10 g by mouth daily as needed (Constipation).   Yes [provider]  lisinopril (PRINIVIL,ZESTRIL) 40 MG tablet Take 40 mg by mouth daily.   Yes [provider]  pantoprazole (PROTONIX) 40 MG tablet Take 1 tablet (40 mg total) by mouth daily. 12/23/16  Yes Marquette Saa, MD  PRESCRIPTION MEDICATION Inhale into the lungs at bedtime. CPAP   Yes [provider]  rivaroxaban (XARELTO) 20 MG TABS tablet Take 20 mg by mouth daily with breakfast.    Yes [provider]  rosuvastatin (CRESTOR) 20 MG tablet Take 20 mg by mouth daily.    Yes [provider]  docusate sodium (COLACE)  100 MG capsule Take 1 capsule (100 mg total) by mouth 2 (two) times daily. 02/14/17 02/14/18  Jaimon Bugaj, Antionette Poles, MD  senna (SENOKOT) 8.6 MG TABS tablet Take 2 tablets (17.2 mg total) by mouth 2 (two) times daily as needed for mild constipation or moderate constipation. 02/14/17   Donterius Filley, Antionette Poles, MD  sildenafil (VIAGRA) 100 MG tablet Take 100 mg by mouth daily as needed for erectile dysfunction.    [provider]    Family History No family history on file.  Social History Social History  Substance Use Topics  . Smoking status: Current Every Day Smoker  . Smokeless  tobacco: Never Used  . Alcohol use No     Allergies   Patient has no known allergies.   Review of Systems Review of Systems  Constitutional: Negative for chills and fever.  Respiratory: Negative for shortness of breath.   Cardiovascular: Negative for chest pain and palpitations.  Gastrointestinal: Positive for blood in stool and constipation. Negative for abdominal distention, abdominal pain, nausea and vomiting.  Neurological: Negative for dizziness and weakness.     Physical Exam Updated Vital Signs BP 135/86   Pulse (!) 54   Temp 98.2 F (36.8 C) (Oral)   Resp 19   SpO2 100%   Physical Exam  Constitutional: He is oriented to person, place, and time. He appears well-developed and well-nourished.  HENT:  Head: Normocephalic and atraumatic.  Eyes: Pupils are equal, round, and reactive to light. Conjunctivae are normal.  Neck: Normal range of motion.  Cardiovascular: Normal rate and regular rhythm.   Pulmonary/Chest: Effort normal and breath sounds normal.  Abdominal: Soft. Bowel sounds are normal.  Genitourinary: Rectal exam shows guaiac positive stool.  Genitourinary Comments: No external hemorrhoids noted or anal fissures   Musculoskeletal: Normal range of motion.  Neurological: He is alert and oriented to person, place, and time.  Skin: Skin is warm. Capillary refill takes less than 2 seconds.     ED Treatments / Results  Labs (all labs ordered are listed, but only abnormal results are displayed) Labs Reviewed  COMPREHENSIVE METABOLIC PANEL - Abnormal; Notable for the following:       Result Value   Calcium 8.6 (*)    All other components within normal limits  CBC - Abnormal; Notable for the following:    Hemoglobin 12.5 (*)    HCT 38.5 (*)    All other components within normal limits  POC OCCULT BLOOD, ED - Abnormal; Notable for the following:    Fecal Occult Bld POSITIVE (*)    All other components within normal limits  OCCULT BLOOD X 1 CARD TO LAB,  STOOL  TYPE AND SCREEN    EKG  EKG Interpretation None       Radiology No results found.  Procedures Procedures (including critical care time)  Medications Ordered in ED Medications - No data to display   Initial Impression / Assessment and Plan / ED Course  I have reviewed the triage vital signs and the nursing notes.  Pertinent labs & imaging results that were available during my care of the patient were reviewed by me and considered in my medical decision making (see chart for details).  Patient presenting with 1 episode of rectal bleeding following significant constipation from Tylenol 3. Patient with a history of bleeding ulcer which was treated with Protonix and for H. pylori per patient. No further episodes of bleeding while on xarelto. Stable vitals, no signs or symptoms of anemia. Patient  was evaluated and observed for 5 hours in the ED without any further bloody bowel movements. Patient to follow-up wit gastronterology outpatient in the next couple of days. Discussed red flags for return   Final Clinical Impressions(s) / ED Diagnoses   Final diagnoses:  Drug-induced constipation  Rectal bleeding    New Prescriptions New Prescriptions   DOCUSATE SODIUM (COLACE) 100 MG CAPSULE    Take 1 capsule (100 mg total) by mouth 2 (two) times daily.   SENNA (SENOKOT) 8.6 MG TABS TABLET    Take 2 tablets (17.2 mg total) by mouth 2 (two) times daily as needed for mild constipation or moderate constipation.     Berton Bon, MD 02/14/17 1611    Gerhard Munch, MD 02/15/17 [phone]

## 2017-02-14 NOTE — ED Triage Notes (Addendum)
PT is here with complaints of having a large bowel movement this am and then went back a 2nd time and had nothing but blood.  He had some blood sprayed in toilet and had some on his tissue.  History of bleeding ulcer in past.  No pain. Pt takes xarelto

## 2017-02-14 NOTE — Discharge Instructions (Signed)
Please take Colace and Senokot to help with your constipation and to soften your stools. If you start having bloody bowel movements consistently, if you feel dizzy, have palpitations, chest pain, shortness of breath please return to be reevaluated. Otherwise please follow up with gastroenterology in the next couple of days

## 2017-10-21 ENCOUNTER — Emergency Department (HOSPITAL_BASED_OUTPATIENT_CLINIC_OR_DEPARTMENT_OTHER)
Admission: EM | Admit: 2017-10-21 | Discharge: 2017-10-21 | Disposition: A | Discharge status: Home / Self Care | Payer: Non-veteran care | Attending: Emergency Medicine | Admitting: Emergency Medicine

## 2017-10-21 ENCOUNTER — Encounter (HOSPITAL_BASED_OUTPATIENT_CLINIC_OR_DEPARTMENT_OTHER): Payer: Self-pay | Admitting: *Deleted

## 2017-10-21 ENCOUNTER — Other Ambulatory Visit: Payer: Self-pay

## 2017-10-21 DIAGNOSIS — I10 Essential (primary) hypertension: Secondary | ICD-10-CM | POA: Insufficient documentation

## 2017-10-21 DIAGNOSIS — R001 Bradycardia, unspecified: Secondary | ICD-10-CM | POA: Diagnosis present

## 2017-10-21 DIAGNOSIS — R5383 Other fatigue: Secondary | ICD-10-CM | POA: Diagnosis not present

## 2017-10-21 DIAGNOSIS — I2510 Atherosclerotic heart disease of native coronary artery without angina pectoris: Secondary | ICD-10-CM | POA: Insufficient documentation

## 2017-10-21 DIAGNOSIS — F17210 Nicotine dependence, cigarettes, uncomplicated: Secondary | ICD-10-CM | POA: Insufficient documentation

## 2017-10-21 DIAGNOSIS — Z7901 Long term (current) use of anticoagulants: Secondary | ICD-10-CM | POA: Diagnosis not present

## 2017-10-21 DIAGNOSIS — R002 Palpitations: Secondary | ICD-10-CM

## 2017-10-21 DIAGNOSIS — Z79899 Other long term (current) drug therapy: Secondary | ICD-10-CM | POA: Insufficient documentation

## 2017-10-21 LAB — URINALYSIS, MICROSCOPIC (REFLEX): WBC, UA: NONE SEEN WBC/hpf (ref 0–5)

## 2017-10-21 LAB — CBC WITH DIFFERENTIAL/PLATELET
Basophils Absolute: 0 10*3/uL (ref 0.0–0.1)
Basophils Relative: 0 %
EOS ABS: 0.2 10*3/uL (ref 0.0–0.7)
EOS PCT: 2 %
HCT: 40.3 % (ref 39.0–52.0)
Hemoglobin: 14.4 g/dL (ref 13.0–17.0)
LYMPHS ABS: 2.9 10*3/uL (ref 0.7–4.0)
Lymphocytes Relative: 42 %
MCH: 30.7 pg (ref 26.0–34.0)
MCHC: 35.7 g/dL (ref 30.0–36.0)
MCV: 85.9 fL (ref 78.0–100.0)
Monocytes Absolute: 0.8 10*3/uL (ref 0.1–1.0)
Monocytes Relative: 12 %
Neutro Abs: 3 10*3/uL (ref 1.7–7.7)
Neutrophils Relative %: 44 %
Platelets: 147 10*3/uL — ABNORMAL LOW (ref 150–400)
RBC: 4.69 MIL/uL (ref 4.22–5.81)
RDW: 14.3 % (ref 11.5–15.5)
WBC: 6.8 10*3/uL (ref 4.0–10.5)

## 2017-10-21 LAB — BASIC METABOLIC PANEL
Anion gap: 7 (ref 5–15)
BUN: 15 mg/dL (ref 6–20)
CHLORIDE: 110 mmol/L (ref 101–111)
CO2: 22 mmol/L (ref 22–32)
Calcium: 8.4 mg/dL — ABNORMAL LOW (ref 8.9–10.3)
Creatinine, Ser: 0.91 mg/dL (ref 0.61–1.24)
GFR calc Af Amer: >60 mL/min (ref >60)
GFR calc non Af Amer: >60 mL/min (ref >60)
Glucose, Bld: 155 mg/dL — ABNORMAL HIGH (ref 65–99)
Potassium: 3.9 mmol/L (ref 3.5–5.1)
SODIUM: 139 mmol/L (ref 135–145)

## 2017-10-21 LAB — URINALYSIS, ROUTINE W REFLEX MICROSCOPIC
BILIRUBIN URINE: NEGATIVE
Glucose, UA: NEGATIVE mg/dL
Ketones, ur: 15 mg/dL — AB
Leukocytes, UA: NEGATIVE
Nitrite: NEGATIVE
Protein, ur: 30 mg/dL — AB
Specific Gravity, Urine: 1.025 (ref 1.005–1.030)
pH: 6.5 (ref 5.0–8.0)

## 2017-10-21 LAB — MAGNESIUM: MAGNESIUM: 2 mg/dL (ref 1.7–2.4)

## 2017-10-21 NOTE — ED Provider Notes (Signed)
[facility] EMERGENCY DEPARTMENT Provider Note   CSN: [id number] Arrival date & time: 10/21/17  1404     History   Chief Complaint Chief Complaint  Patient presents with  . Bradycardia    HPI Cameron Schultz is a 62 y.o. male.  HPI Patient presents with his wife who assists with the HPI. He presents due to concern of bradycardia. Patient had episode of palpitations for the past week, and during a scheduled visit with his physician at the [facility], he was started on a continuous cardiac monitor device. He notes that since that time 3 days ago he has been generally well, without sustained palpitations, chest pain, dyspnea, syncope. Today, however, the patient felt more tired than usual, checked his pulse found to be low, and comes here for evaluation.  When he did not receive a telephone call alerting him that his monitoring device detected abnormal rhythm. Patient himself denies history of arrhythmia, does have history of open heart surgery, hypertension, and he states borderline diabetes. Patient takes Xarelto and aspirin, as well as beta-blocker and calcium channel blocker.  Past Medical History:  Diagnosis Date  . Atrial fibrillation (HCC)   . Coronary artery disease   . Hypertension     Patient Active Problem List   Diagnosis Date Noted  . GI bleed 12/20/2016  . Acute upper GI bleed   . Chronic anticoagulation     Past Surgical History:  Procedure Laterality Date  . CARDIAC SURGERY    . CERVICAL FUSION    . ESOPHAGOGASTRODUODENOSCOPY (EGD) WITH PROPOFOL N/A 12/21/2016   Procedure: ESOPHAGOGASTRODUODENOSCOPY (EGD) WITH PROPOFOL;  Surgeon: Kathi Der, MD;  Location: [facility] ENDOSCOPY;  Service: Gastroenterology;  Laterality: N/A;  . TONSILLECTOMY          Home Medications    Prior to Admission medications   Medication Sig Start Date End Date Taking? Authorizing Provider  acetaminophen (TYLENOL) 500 MG chewable tablet Chew 1,000 mg by  mouth every 6 (six) hours as needed for pain.    [provider]  acetaminophen-codeine (TYLENOL #3) 300-30 MG tablet Take 1 tablet by mouth 2 (two) times daily as needed for moderate pain.    [provider]  carvedilol (COREG) 25 MG tablet Take 25 mg by mouth 2 (two) times daily with a meal.     [provider]  clotrimazole (LOTRIMIN) 1 % cream Apply 1 application topically 2 (two) times daily.    [provider]  cyclobenzaprine (FLEXERIL) 10 MG tablet Take 10 mg by mouth 3 (three) times daily as needed for muscle spasms.    [provider]  diltiazem (DILTIAZEM CD) 120 MG 24 hr capsule Take 120 mg by mouth daily.     [provider]  docusate sodium (COLACE) 100 MG capsule Take 1 capsule (100 mg total) by mouth 2 (two) times daily. 02/14/17 02/14/18  Mikell, Antionette Poles, MD  lactulose, encephalopathy, (CHRONULAC) 10 GM/15ML SOLN Take 10 g by mouth daily as needed (Constipation).    [provider]  lisinopril (PRINIVIL,ZESTRIL) 40 MG tablet Take 40 mg by mouth daily.    [provider]  pantoprazole (PROTONIX) 40 MG tablet Take 1 tablet (40 mg total) by mouth daily. 12/23/16   Marquette Saa, MD  PRESCRIPTION MEDICATION Inhale into the lungs at bedtime. CPAP    [provider]  rivaroxaban (XARELTO) 20 MG TABS tablet Take 20 mg by mouth daily with breakfast.     [provider]  rosuvastatin (CRESTOR)  20 MG tablet Take 20 mg by mouth daily.     [provider]  senna (SENOKOT) 8.6 MG TABS tablet Take 2 tablets (17.2 mg total) by mouth 2 (two) times daily as needed for mild constipation or moderate constipation. 02/14/17   Mikell, Antionette Poles, MD  sildenafil (VIAGRA) 100 MG tablet Take 100 mg by mouth daily as needed for erectile dysfunction.    [provider]    Family History History reviewed. No pertinent family history.  Social History Social History   Tobacco Use  .  Smoking status: Current Every Day Smoker    Packs/day: 0.50  . Smokeless tobacco: Never Used  Substance Use Topics  . Alcohol use: No  . Drug use: No     Allergies   Patient has no known allergies.   Review of Systems Review of Systems  Constitutional:       Per HPI, otherwise negative  HENT:       Per HPI, otherwise negative  Respiratory:       Per HPI, otherwise negative  Cardiovascular:       Per HPI, otherwise negative  Gastrointestinal: Negative for vomiting.  Endocrine:       Negative aside from HPI  Genitourinary:       Neg aside from HPI   Musculoskeletal:       Per HPI, otherwise negative  Skin: Negative.   Neurological: Negative for syncope.     Physical Exam Updated Vital Signs BP 126/72 (BP Location: Left Arm)   Pulse 64   Temp 98 F (36.7 C) (Oral)   Resp 20   Ht  (1.88 m)   Wt 127 kg (280 lb)   SpO2 97%   BMI 35.95 kg/m   Physical Exam  Constitutional: He is oriented to person, place, and time. He appears well-developed. No distress.  HENT:  Head: Normocephalic and atraumatic.  Eyes: Conjunctivae and EOM are normal.  Cardiovascular: Normal rate, regular rhythm and intact distal pulses.  Pulmonary/Chest: Effort normal. No stridor. No respiratory distress.  Left upper chest wall with continuous monitoring device taped on, unremarkable  Abdominal: He exhibits no distension.  Musculoskeletal: He exhibits no edema.  Neurological: He is alert and oriented to person, place, and time.  Skin: Skin is warm and dry.  Psychiatric: He has a normal mood and affect.  Nursing note and vitals reviewed.    ED Treatments / Results  Labs (all labs ordered are listed, but only abnormal results are displayed) Labs Reviewed  BASIC METABOLIC PANEL - Abnormal; Notable for the following components:      Result Value   Glucose, Bld 155 (*)    Calcium 8.4 (*)    All other components within normal limits  CBC WITH DIFFERENTIAL/PLATELET - Abnormal;  Notable for the following components:   Platelets 147 (*)    All other components within normal limits  URINALYSIS, ROUTINE W REFLEX MICROSCOPIC - Abnormal; Notable for the following components:   Hgb urine dipstick TRACE (*)    Ketones, ur 15 (*)    Protein, ur 30 (*)    All other components within normal limits  URINALYSIS, MICROSCOPIC (REFLEX) - Abnormal; Notable for the following components:   Bacteria, UA RARE (*)    All other components within normal limits  MAGNESIUM    EKG EKG Interpretation  Date/Time:  Friday Oct 21 2017 14:21:12 EDT Ventricular Rate:  62 PR Interval:    QRS Duration: 104 QT Interval:  443 QTC  Calculation: 450 R Axis:   -7 Text Interpretation:  Sinus rhythm Supraventricular bigeminy Probable left atrial enlargement Inferior infarct, old ST-t wave abnormality Abnormal ekg Confirmed by Gerhard Munch [phone]) on 10/21/2017 3:25:55 PM   Procedures Procedures (including critical care time)  Medications Ordered in ED Medications - No data to display   Initial Impression / Assessment and Plan / ED Course  I have reviewed the triage vital signs and the nursing notes.  Pertinent labs & imaging results that were available during my care of the patient were reviewed by me and considered in my medical decision making (see chart for details).    Review notable for history of atrial fibrillation with rapid ventricular response, on EKG several years ago.  Patient's initial rhythm on the monitor is sinus rhythm, rate 60/70, blood pressure unremarkable.   4:16 PM Patient in no distress. Labs reassuring, no substantial electrolyte abnormalities. Patient does have some mild ketonuria, proteinuria, and elevated glucose, concerning for progression of his reported prediabetic state. This was discussed the patient and his wife Findings otherwise reassuring, with no evidence for sustained arrhythmia on monitor here after several hours of monitoring, no evidence  for ischemia, and no ongoing complaints, there is low suspicion for other acute new pathology. Some suspicion for the patient's medication needing adjustment, and he was encouraged to this with his physicians upon returning to the [facility]. Patient will continue cardiac monitoring, follow-up with his cardiologist at the [facility].   Final Clinical Impressions(s) / ED Diagnoses  Bradycardia Fatigue   Gerhard Munch, MD 10/21/17 1620

## 2017-10-21 NOTE — Discharge Instructions (Addendum)
As discussed, your evaluation today has been largely reassuring.  But, it is important that you monitor your condition carefully, and do not hesitate to return to the ED if you develop new, or concerning changes in your condition. ? ?Otherwise, please follow-up with your physician for appropriate ongoing care. ? ?

## 2017-10-21 NOTE — ED Notes (Signed)
Pt on monitor 

## 2017-10-21 NOTE — ED Triage Notes (Signed)
Pt c/o HR 40 at home , pt is wearing a halter monitor d/t " irregular heart rate"

## 2018-03-02 ENCOUNTER — Other Ambulatory Visit: Payer: Self-pay

## 2018-03-02 ENCOUNTER — Emergency Department (HOSPITAL_BASED_OUTPATIENT_CLINIC_OR_DEPARTMENT_OTHER)
Admission: EM | Admit: 2018-03-02 | Discharge: 2018-03-02 | Disposition: A | Discharge status: Home / Self Care | Payer: No Typology Code available for payment source | Attending: Emergency Medicine | Admitting: Emergency Medicine

## 2018-03-02 ENCOUNTER — Encounter (HOSPITAL_BASED_OUTPATIENT_CLINIC_OR_DEPARTMENT_OTHER): Payer: Self-pay | Admitting: *Deleted

## 2018-03-02 ENCOUNTER — Emergency Department (HOSPITAL_BASED_OUTPATIENT_CLINIC_OR_DEPARTMENT_OTHER): Payer: No Typology Code available for payment source

## 2018-03-02 DIAGNOSIS — F17210 Nicotine dependence, cigarettes, uncomplicated: Secondary | ICD-10-CM | POA: Diagnosis not present

## 2018-03-02 DIAGNOSIS — R0602 Shortness of breath: Secondary | ICD-10-CM | POA: Diagnosis not present

## 2018-03-02 DIAGNOSIS — Z7901 Long term (current) use of anticoagulants: Secondary | ICD-10-CM | POA: Insufficient documentation

## 2018-03-02 DIAGNOSIS — I2510 Atherosclerotic heart disease of native coronary artery without angina pectoris: Secondary | ICD-10-CM | POA: Insufficient documentation

## 2018-03-02 DIAGNOSIS — R5381 Other malaise: Secondary | ICD-10-CM | POA: Diagnosis not present

## 2018-03-02 DIAGNOSIS — R42 Dizziness and giddiness: Secondary | ICD-10-CM | POA: Insufficient documentation

## 2018-03-02 DIAGNOSIS — I4891 Unspecified atrial fibrillation: Secondary | ICD-10-CM | POA: Diagnosis not present

## 2018-03-02 DIAGNOSIS — R079 Chest pain, unspecified: Secondary | ICD-10-CM | POA: Insufficient documentation

## 2018-03-02 DIAGNOSIS — R002 Palpitations: Secondary | ICD-10-CM | POA: Diagnosis present

## 2018-03-02 DIAGNOSIS — I10 Essential (primary) hypertension: Secondary | ICD-10-CM | POA: Diagnosis not present

## 2018-03-02 DIAGNOSIS — Z79899 Other long term (current) drug therapy: Secondary | ICD-10-CM | POA: Diagnosis not present

## 2018-03-02 DIAGNOSIS — R5383 Other fatigue: Secondary | ICD-10-CM | POA: Diagnosis not present

## 2018-03-02 LAB — URINALYSIS, MICROSCOPIC (REFLEX)

## 2018-03-02 LAB — COMPREHENSIVE METABOLIC PANEL
ALBUMIN: 3.6 g/dL (ref 3.5–5.0)
ALK PHOS: 70 U/L (ref 38–126)
ALT: 20 U/L (ref 0–44)
AST: 23 U/L (ref 15–41)
Anion gap: 6 (ref 5–15)
BILIRUBIN TOTAL: 1 mg/dL (ref 0.3–1.2)
BUN: 18 mg/dL (ref 8–23)
CALCIUM: 8.6 mg/dL — AB (ref 8.9–10.3)
CO2: 23 mmol/L (ref 22–32)
Chloride: 109 mmol/L (ref 98–111)
Creatinine, Ser: 0.89 mg/dL (ref 0.61–1.24)
GFR calc Af Amer: >60 mL/min (ref >60)
GFR calc non Af Amer: >60 mL/min (ref >60)
GLUCOSE: 90 mg/dL (ref 70–99)
Potassium: 3.8 mmol/L (ref 3.5–5.1)
Sodium: 138 mmol/L (ref 135–145)
TOTAL PROTEIN: 7.2 g/dL (ref 6.5–8.1)

## 2018-03-02 LAB — CBC WITH DIFFERENTIAL/PLATELET
BASOS ABS: 0 10*3/uL (ref 0.0–0.1)
BASOS PCT: 0 %
Eosinophils Absolute: 0.2 10*3/uL (ref 0.0–0.7)
Eosinophils Relative: 2 %
HEMATOCRIT: 42.2 % (ref 39.0–52.0)
HEMOGLOBIN: 14.5 g/dL (ref 13.0–17.0)
Lymphocytes Relative: 48 %
Lymphs Abs: 3.4 10*3/uL (ref 0.7–4.0)
MCH: 29.8 pg (ref 26.0–34.0)
MCHC: 34.4 g/dL (ref 30.0–36.0)
MCV: 86.7 fL (ref 78.0–100.0)
MONOS PCT: 13 %
Monocytes Absolute: 0.9 10*3/uL (ref 0.1–1.0)
NEUTROS ABS: 2.7 10*3/uL (ref 1.7–7.7)
Neutrophils Relative %: 37 %
Platelets: 149 10*3/uL — ABNORMAL LOW (ref 150–400)
RBC: 4.87 MIL/uL (ref 4.22–5.81)
RDW: 15 % (ref 11.5–15.5)
WBC: 7.2 10*3/uL (ref 4.0–10.5)

## 2018-03-02 LAB — URINALYSIS, ROUTINE W REFLEX MICROSCOPIC
Bilirubin Urine: NEGATIVE
Glucose, UA: NEGATIVE mg/dL
KETONES UR: NEGATIVE mg/dL
Leukocytes, UA: NEGATIVE
Nitrite: NEGATIVE
PH: 6 (ref 5.0–8.0)
PROTEIN: 30 mg/dL — AB

## 2018-03-02 LAB — TROPONIN I: Troponin I: 0.03 ng/mL (ref <0.03)

## 2018-03-02 LAB — MAGNESIUM: Magnesium: 2 mg/dL (ref 1.7–2.4)

## 2018-03-02 MED ORDER — SODIUM CHLORIDE 0.9% FLUSH
3.0000 mL | INTRAVENOUS | Status: DC | PRN
  Filled 2018-03-02: qty 3

## 2018-03-02 MED ORDER — SODIUM CHLORIDE 0.9 % IV SOLN: 250.0000 mL | INTRAVENOUS | Status: DC

## 2018-03-02 MED ORDER — SODIUM CHLORIDE 0.9% FLUSH
3.0000 mL | Freq: Two times a day (BID) | INTRAVENOUS | Status: DC
  Administered 2018-03-02: 3 mL via INTRAVENOUS
  Filled 2018-03-02: qty 3

## 2018-03-02 MED ORDER — ETOMIDATE 2 MG/ML IV SOLN
10.0000 mg | Freq: Once | INTRAVENOUS | Status: AC
  Administered 2018-03-02: 10 mg via INTRAVENOUS
  Filled 2018-03-02: qty 10

## 2018-03-02 MED ORDER — DILTIAZEM HCL ER COATED BEADS 120 MG PO CP24: 120.0000 mg | ORAL_CAPSULE | Freq: Every day | ORAL | 0 refills | Status: DC

## 2018-03-02 MED ORDER — SODIUM CHLORIDE 0.9 % IV BOLUS
1000.0000 mL | Freq: Once | INTRAVENOUS | Status: AC
  Administered 2018-03-02: 1000 mL via INTRAVENOUS

## 2018-03-02 NOTE — Sedation Documentation (Signed)
Pt not voicing pain at this time.

## 2018-03-02 NOTE — ED Notes (Signed)
Pt on xarelto- d/c Cardizem less than a month ago.

## 2018-03-02 NOTE — ED Provider Notes (Signed)
Assumed care from Dr Julieanne Manson at change of shift. Please refer to his note for full H&P. In brief he is having palpitations. He is in afib which he has a history of. Rate reasonable right around 100. Discussed options in terms of better rate control and cardiology FU versus elective cardioversion in ED. He would like to proceed with cardioversion.  Electrolytes are fine. Troponin is just minimally elevated. He denies having any pain. May be mild demand ischemia. He is on xarelto and reports very good compliance with his meds and no missed doses in past week.   Successfully cardioverted to sinus rhythm. Says he feels better. Needs to follow-up with his cardiologist to discuss further. Emergent return precautions discussed.   .Sedation Date/Time: 03/02/2018 4:00 PM Performed by: Raeford Razor, MD Authorized by: Raeford Razor, MD   Consent:    Consent obtained:  Verbal   Consent given by:  Patient   Risks discussed:  Allergic reaction, dysrhythmia, inadequate sedation, nausea, prolonged hypoxia resulting in organ damage, prolonged sedation necessitating reversal, respiratory compromise necessitating ventilatory assistance and intubation and vomiting   Alternatives discussed:  Analgesia without sedation, anxiolysis and regional anesthesia Universal protocol:    Procedure explained and questions answered to patient or proxy's satisfaction: yes     Relevant documents present and verified: yes     Test results available and properly labeled: yes     Imaging studies available: yes     Required blood products, implants, devices, and special equipment available: yes     Site/side marked: yes     Immediately prior to procedure a time out was called: yes     Patient identity confirmation method:  Verbally with patient and provided demographic data Indications:    Procedure necessitating sedation performed by:  Physician performing sedation Pre-sedation assessment:    Time since last food or drink:   .   ASA classification: class 3 - patient with severe systemic disease     Neck mobility: normal     Mouth opening:  3 or more finger widths   Thyromental distance:  4 finger widths   Mallampati score:  I - soft palate, uvula, fauces, pillars visible   Pre-sedation assessments completed and reviewed: airway patency, cardiovascular function, hydration status, mental status, nausea/vomiting, pain level, respiratory function and temperature   Immediate pre-procedure details:    Reassessment: Patient reassessed immediately prior to procedure     Reviewed: vital signs, relevant labs/tests and NPO status     Verified: bag valve mask available, emergency equipment available, intubation equipment available, IV patency confirmed, oxygen available and suction available   Procedure details (see MAR for exact dosages):    Preoxygenation:  Nasal cannula   Sedation:  Etomidate   Intra-procedure monitoring:  Blood pressure monitoring, cardiac monitor, continuous pulse oximetry, frequent LOC assessments, frequent vital sign checks and continuous capnometry   Intra-procedure events: none     Total Provider sedation time (minutes):  35 Post-procedure details:    Attendance: Constant attendance by certified staff until patient recovered     Recovery: Patient returned to pre-procedure baseline     Post-sedation assessments completed and reviewed: airway patency, cardiovascular function, hydration status, mental status, nausea/vomiting, pain level, respiratory function and temperature     Patient is stable for discharge or admission: yes     Patient tolerance:  Tolerated well, no immediate complications .Cardioversion Date/Time: 03/02/2018 4:00 PM Performed by: Raeford Razor, MD Authorized by: Raeford Razor, MD   Consent:    Consent  obtained:  Verbal   Consent given by:  Patient   Risks discussed:  Cutaneous burn, induced arrhythmia and pain   Alternatives discussed:  Alternative treatment, rate-control  medication and delayed treatment Pre-procedure details:    Cardioversion basis:  Elective   Rhythm:  Atrial fibrillation   Electrode placement:  Anterior-posterior Patient sedated: Yes. Refer to sedation procedure documentation for details of sedation.  Attempt one:    Cardioversion mode:  Synchronous   Waveform:  Biphasic   Shock (Joules):  120   Shock outcome:  Conversion to normal sinus rhythm Post-procedure details:    Patient status:  Awake   Patient tolerance of procedure:  Tolerated well, no immediate complications    CRITICAL CARE Performed by: Raeford Razor Total critical care time: 30 minutes Critical care time was exclusive of separately billable procedures and treating other patients. Critical care was necessary to treat or prevent imminent or life-threatening deterioration. Critical care was time spent personally by me on the following activities: development of treatment plan with patient and/or surrogate as well as nursing, discussions with consultants, evaluation of patient's response to treatment, examination of patient, obtaining history from patient or surrogate, ordering and performing treatments and interventions, ordering and review of laboratory studies, ordering and review of radiographic studies, pulse oximetry and re-evaluation of patient's condition.    Raeford Razor, MD 03/02/18 [phone]

## 2018-03-02 NOTE — Discharge Instructions (Signed)
Follow-up with your PCP or cardiologist.

## 2018-03-02 NOTE — Sedation Documentation (Addendum)
10mg  Etomidate administered

## 2018-03-02 NOTE — ED Notes (Signed)
Pt on monitor 

## 2018-03-02 NOTE — ED Triage Notes (Signed)
Dizziness. Hx of atrial fib. States he feels like he is in atrial fib.

## 2018-03-02 NOTE — Sedation Documentation (Addendum)
Cardioversion performed. Pt shocked at 125joules.

## 2018-03-02 NOTE — Sedation Documentation (Signed)
Pt denies pain.

## 2018-03-02 NOTE — ED Provider Notes (Signed)
[facility] EMERGENCY DEPARTMENT Provider Note   CSN: [id number] Arrival date & time: 03/02/18  1354     History   Chief Complaint Chief Complaint  Patient presents with  . Dizziness    HPI Cameron Schultz is a 62 y.o. male.  The history is provided by the patient, medical records and the spouse. No language interpreter was used.  Palpitations   This is a recurrent problem. The current episode started yesterday. The problem occurs constantly. The problem has not changed since onset.Associated symptoms include malaise/fatigue, chest pain (resolved), irregular heartbeat and shortness of breath (resolved). Pertinent negatives include no diaphoresis, no fever, no numbness, no chest pressure, no exertional chest pressure, no abdominal pain, no nausea, no vomiting, no headaches, no back pain, no leg pain, no lower extremity edema, no weakness, no cough and no sputum production. He has tried nothing for the symptoms.    Past Medical History:  Diagnosis Date  . Atrial fibrillation (HCC)   . Coronary artery disease   . Hypertension     Patient Active Problem List   Diagnosis Date Noted  . GI bleed 12/20/2016  . Acute upper GI bleed   . Chronic anticoagulation     Past Surgical History:  Procedure Laterality Date  . CARDIAC SURGERY    . CERVICAL FUSION    . ESOPHAGOGASTRODUODENOSCOPY (EGD) WITH PROPOFOL N/A 12/21/2016   Procedure: ESOPHAGOGASTRODUODENOSCOPY (EGD) WITH PROPOFOL;  Surgeon: Kathi Der, MD;  Location: [facility] ENDOSCOPY;  Service: Gastroenterology;  Laterality: N/A;  . TONSILLECTOMY          Home Medications    Prior to Admission medications   Medication Sig Start Date End Date Taking? Authorizing Provider  acetaminophen (TYLENOL) 500 MG chewable tablet Chew 1,000 mg by mouth every 6 (six) hours as needed for pain.    [provider]  acetaminophen-codeine (TYLENOL #3) 300-30 MG tablet Take 1 tablet by mouth 2 (two) times daily as needed for  moderate pain.    [provider]  carvedilol (COREG) 25 MG tablet Take 25 mg by mouth 2 (two) times daily with a meal.     [provider]  clotrimazole (LOTRIMIN) 1 % cream Apply 1 application topically 2 (two) times daily.    [provider]  cyclobenzaprine (FLEXERIL) 10 MG tablet Take 10 mg by mouth 3 (three) times daily as needed for muscle spasms.    [provider]  diltiazem (DILTIAZEM CD) 120 MG 24 hr capsule Take 120 mg by mouth daily.     [provider]  lactulose, encephalopathy, (CHRONULAC) 10 GM/15ML SOLN Take 10 g by mouth daily as needed (Constipation).    [provider]  lisinopril (PRINIVIL,ZESTRIL) 40 MG tablet Take 40 mg by mouth daily.    [provider]  pantoprazole (PROTONIX) 40 MG tablet Take 1 tablet (40 mg total) by mouth daily. 12/23/16   Marquette Saa, MD  PRESCRIPTION MEDICATION Inhale into the lungs at bedtime. CPAP    [provider]  rivaroxaban (XARELTO) 20 MG TABS tablet Take 20 mg by mouth daily with breakfast.     [provider]  rosuvastatin (CRESTOR) 20 MG tablet Take 20 mg by mouth daily.     [provider]  senna (SENOKOT) 8.6 MG TABS tablet Take 2 tablets (17.2 mg total) by mouth 2 (two) times daily as needed for mild constipation or moderate constipation. 02/14/17   Mikell, Antionette Poles, MD  sildenafil (VIAGRA) 100 MG tablet Take 100 mg  by mouth daily as needed for erectile dysfunction.    [provider]    Family History No family history on file.  Social History Social History   Tobacco Use  . Smoking status: Current Every Day Smoker    Packs/day: 0.50  . Smokeless tobacco: Never Used  Substance Use Topics  . Alcohol use: No  . Drug use: No     Allergies   Patient has no known allergies.   Review of Systems Review of Systems  Constitutional: Positive for fatigue and malaise/fatigue. Negative for chills, diaphoresis and  fever.  HENT: Negative for congestion.   Eyes: Negative for visual disturbance.  Respiratory: Positive for shortness of breath (resolved). Negative for cough, sputum production, chest tightness, wheezing and stridor.   Cardiovascular: Positive for chest pain (resolved) and palpitations.  Gastrointestinal: Negative for abdominal pain, nausea and vomiting.  Genitourinary: Negative for flank pain.  Musculoskeletal: Negative for back pain, neck pain and neck stiffness.  Skin: Negative for rash and wound.  Neurological: Positive for light-headedness. Negative for seizures, syncope, speech difficulty, weakness, numbness and headaches.  Psychiatric/Behavioral: Negative for agitation and confusion.  All other systems reviewed and are negative.    Physical Exam Updated Vital Signs BP 108/69   Pulse (!) 25   Temp 98.4 F (36.9 C) (Oral)   Resp 13   Ht 6\' 2"  (1.88 m)   Wt 122.5 kg   SpO2 100%   BMI 34.67 kg/m   Physical Exam  Constitutional: He appears well-developed and well-nourished. No distress.  HENT:  Head: Normocephalic and atraumatic.  Mouth/Throat: Oropharynx is clear and moist. No oropharyngeal exudate.  Eyes: Pupils are equal, round, and reactive to light. Conjunctivae and EOM are normal.  Neck: Normal range of motion. Neck supple.  Cardiovascular: Intact distal pulses. An irregularly irregular rhythm present. Tachycardia present.  No murmur heard. Pulmonary/Chest: Effort normal and breath sounds normal. No respiratory distress. He has no wheezes. He has no rales. He exhibits no tenderness.  Abdominal: Soft. There is no tenderness.  Musculoskeletal: He exhibits no edema.  Neurological: He is alert.  Skin: Skin is warm and dry. He is not diaphoretic.  Psychiatric: He has a normal mood and affect.  Nursing note and vitals reviewed.    ED Treatments / Results  Labs (all labs ordered are listed, but only abnormal results are displayed) Labs Reviewed  CBC WITH  DIFFERENTIAL/PLATELET - Abnormal; Notable for the following components:      Result Value   Platelets 149 (*)    All other components within normal limits  COMPREHENSIVE METABOLIC PANEL - Abnormal; Notable for the following components:   Calcium 8.6 (*)    All other components within normal limits  TROPONIN I - Abnormal; Notable for the following components:   Troponin I 0.03 (*)    All other components within normal limits  URINE CULTURE  MAGNESIUM  URINALYSIS, ROUTINE W REFLEX MICROSCOPIC    EKG EKG Interpretation  Date/Time:  Thursday March 02 2018 14:12:59 EDT Ventricular Rate:  120 PR Interval:    QRS Duration: 90 QT Interval:  352 QTC Calculation: 497 R Axis:   -7 Text Interpretation:  Atrial fibrillation with rapid ventricular response Inferior infarct , age undetermined Abnormal ECG When compared to prior, now Afib vs flutter with RVR.  No STEMI Confirmed by Theda Belfast ([id number]) on 03/02/2018 2:26:55 PM Also confirmed by Theda Belfast ([id number]), editor Barbette Hair [phone])  on 03/02/2018 3:25:50 PM   Radiology Dg Chest 2  View  Result Date: 03/02/2018 CLINICAL DATA:  Dizziness.  Atrial fibrillation EXAM: CHEST - 2 VIEW COMPARISON:  July 12, 2016 FINDINGS: There is no edema or consolidation. Heart is upper normal in size with pulmonary vascularity normal. No adenopathy. Patient is status post median sternotomy. There is postoperative change in the lower cervical region. No evident pneumothorax. IMPRESSION: No edema or consolidation.  Stable cardiac silhouette. Electronically Signed   By: Bretta Bang III M.D.   On: 03/02/2018 15:08    Procedures Procedures (including critical care time)  Medications Ordered in ED Medications  sodium chloride 0.9 % bolus 1,000 mL (1,000 mLs Intravenous New Bag/Given 03/02/18 1538)  etomidate (AMIDATE) injection 10 mg (has no administration in time range)  sodium chloride flush (NS) 0.9 % injection 3 mL (has no administration in  time range)  sodium chloride flush (NS) 0.9 % injection 3 mL (has no administration in time range)  0.9 %  sodium chloride infusion (has no administration in time range)     Initial Impression / Assessment and Plan / ED Course  I have reviewed the triage vital signs and the nursing notes.  Pertinent labs & imaging results that were available during my care of the patient were reviewed by me and considered in my medical decision making (see chart for details).     Cameron Schultz is a 62 y.o. 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  He reports that his symptoms began last night.  He reports that he frequently has episodes of atrial fibrillation and has to get shocked to get out of it.  He denies any preceding symptoms including no recent fevers, chills, cough, congestion, urinary symptoms or GI symptoms.  He denies any recent medication changes.  He reports his been compliant with all of his medications.  He denies any syncopal episodes or any current chest pain.  He does report that when he began he did have a small amount of chest pain and had some shortness of breath.  He reported he had a small wheeze with it that also improved.  He denies any other complaints.    On exam, patient had no wheezing but had an irregular palpated pulse.  Initial EKG showed A. fib with RVR.  No murmur was appreciated.  Legs were nonedematous and nontender.  No focal neurologic deficits.  Patient resting comfortably on 2 L nasal cannula for oxygen saturations around 90%.  EKG showed A. fib with RVR.  Clinically I suspect patient's symptoms of palpitations and lightheadedness as well as the fleeting chest pain shortness breath yesterday were due to the A. fib recurrence.  As patient is on blood thinners, he is likely candidate for electrocardioversion in the emergency department.    Patient will screen  laboratory testing to make sure there is no occult infection or electrolyte abnormality contributing to this episode.    Patient's laboratory testing began to return.  Magnesium was normal and potassium is normal.  Kidney function is not elevated.  Liver function is normal.  No leukocytosis or anemia at this time.  Troponin is elevated 0.03.  Suspect this is demand in the setting of his A. fib with RVR.  Patient is awaiting for urinalysis.  Chest x-ray did not show pneumonia.    Given patient's well appearance and his thus far reassuring work-up, I suspect he will be a candidate for ED cardioversion.  Care transferred to  Dr. Juleen China while awaiting urinalysis results and reassessment.  Anticipate patient will be stable for discharge home after cardioversion if there were no comp occasions or problems and patient improves.  Care transferred in stable condition.  Final Clinical Impressions(s) / ED Diagnoses   Final diagnoses:  Atrial fibrillation with RVR (HCC)  Lightheadedness  Palpitation     Clinical Impression: 1. Atrial fibrillation with RVR (HCC)   2. Lightheadedness   3. Palpitation     Disposition: Care transferred to Dr. Theodoro Grist while awaiting work-up results and further management.  This note was prepared with assistance of Conservation officer, historic buildings. Occasional wrong-word or sound-a-like substitutions may have occurred due to the inherent limitations of voice recognition software.      Tegeler, Canary Brim, MD 03/02/18 [phone]

## 2018-03-04 LAB — URINE CULTURE: Culture: NO GROWTH

## 2018-03-20 NOTE — ED Notes (Signed)
Late Entry- Time out was performed prior to procedure.

## 2018-04-06 DIAGNOSIS — M545 Low back pain, unspecified: Secondary | ICD-10-CM | POA: Diagnosis present

## 2018-04-06 DIAGNOSIS — M51369 Other intervertebral disc degeneration, lumbar region without mention of lumbar back pain or lower extremity pain: Secondary | ICD-10-CM | POA: Insufficient documentation

## 2018-06-27 ENCOUNTER — Emergency Department (HOSPITAL_COMMUNITY): Payer: No Typology Code available for payment source

## 2018-06-27 ENCOUNTER — Emergency Department (HOSPITAL_COMMUNITY)
Admission: EM | Admit: 2018-06-27 | Discharge: 2018-06-28 | Disposition: A | Discharge status: Home / Self Care | Payer: No Typology Code available for payment source | Attending: Emergency Medicine | Admitting: Emergency Medicine

## 2018-06-27 DIAGNOSIS — F17210 Nicotine dependence, cigarettes, uncomplicated: Secondary | ICD-10-CM | POA: Insufficient documentation

## 2018-06-27 DIAGNOSIS — I4891 Unspecified atrial fibrillation: Secondary | ICD-10-CM | POA: Insufficient documentation

## 2018-06-27 DIAGNOSIS — I10 Essential (primary) hypertension: Secondary | ICD-10-CM | POA: Insufficient documentation

## 2018-06-27 DIAGNOSIS — I9589 Other hypotension: Secondary | ICD-10-CM | POA: Insufficient documentation

## 2018-06-27 DIAGNOSIS — Z7901 Long term (current) use of anticoagulants: Secondary | ICD-10-CM | POA: Insufficient documentation

## 2018-06-27 DIAGNOSIS — I259 Chronic ischemic heart disease, unspecified: Secondary | ICD-10-CM | POA: Insufficient documentation

## 2018-06-27 DIAGNOSIS — Z79899 Other long term (current) drug therapy: Secondary | ICD-10-CM | POA: Diagnosis not present

## 2018-06-27 LAB — PROTIME-INR
INR: 1.48
Prothrombin Time: 17.7 seconds — ABNORMAL HIGH (ref 11.4–15.2)

## 2018-06-27 LAB — BASIC METABOLIC PANEL
Anion gap: 9 (ref 5–15)
BUN: 16 mg/dL (ref 8–23)
CO2: 21 mmol/L — ABNORMAL LOW (ref 22–32)
Calcium: 8.7 mg/dL — ABNORMAL LOW (ref 8.9–10.3)
Chloride: 109 mmol/L (ref 98–111)
Creatinine, Ser: 1.02 mg/dL (ref 0.61–1.24)
GFR calc Af Amer: >60 mL/min (ref >60)
GFR calc non Af Amer: >60 mL/min (ref >60)
Glucose, Bld: 142 mg/dL — ABNORMAL HIGH (ref 70–99)
Potassium: 3.9 mmol/L (ref 3.5–5.1)
Sodium: 139 mmol/L (ref 135–145)

## 2018-06-27 LAB — CBC
HCT: 43.8 % (ref 39.0–52.0)
Hemoglobin: 14.4 g/dL (ref 13.0–17.0)
MCH: 29.4 pg (ref 26.0–34.0)
MCHC: 32.9 g/dL (ref 30.0–36.0)
MCV: 89.6 fL (ref 80.0–100.0)
Platelets: 166 10*3/uL (ref 150–400)
RBC: 4.89 MIL/uL (ref 4.22–5.81)
RDW: 14.3 % (ref 11.5–15.5)
WBC: 8.6 10*3/uL (ref 4.0–10.5)
nRBC: 0 % (ref 0.0–0.2)

## 2018-06-27 LAB — I-STAT TROPONIN, ED: Troponin i, poc: 0.01 ng/mL (ref 0.00–0.08)

## 2018-06-27 MED ORDER — ETOMIDATE 2 MG/ML IV SOLN
20.0000 mg | Freq: Once | INTRAVENOUS | Status: DC
  Filled 2018-06-27: qty 10

## 2018-06-27 MED ORDER — SODIUM CHLORIDE 0.9% FLUSH
3.0000 mL | Freq: Once | INTRAVENOUS | Status: AC
  Administered 2018-06-28: 3 mL via INTRAVENOUS

## 2018-06-27 MED ORDER — SODIUM CHLORIDE 0.9 % IV BOLUS
1000.0000 mL | Freq: Once | INTRAVENOUS | Status: AC
  Administered 2018-06-27: 1000 mL via INTRAVENOUS

## 2018-06-27 NOTE — ED Notes (Signed)
Consent signed for cardioversion and at bedside.

## 2018-06-27 NOTE — ED Provider Notes (Addendum)
[facility] EMERGENCY DEPARTMENT Provider Note  CSN: [id number] Arrival date & time: 06/27/18 2045  Chief Complaint(s) Atrial Fibrillation  HPI Cameron Schultz is a 63 y.o. male w/ h/o paroxysmal Afib on Xarelto (cha2ds2-vasc of 2) who began feeling palpitations at 5pm today.  The history is provided by the patient.  Atrial Fibrillation  This is a recurrent problem. Episode onset: 6 hrs. The problem occurs constantly. The problem has not changed since onset.Associated symptoms include shortness of breath. Pertinent negatives include no chest pain, no abdominal pain and no headaches. Nothing aggravates the symptoms. Nothing relieves the symptoms. He has tried nothing for the symptoms. The treatment provided no relief.   Has not missed Xarelto dose in 3 weeks. Has not taken extra BP meds.  Reports that he has needed prior cardioversion for being in A. fib RVR.  Past Medical History Past Medical History:  Diagnosis Date  . Atrial fibrillation (HCC)   . Coronary artery disease   . Hypertension    Patient Active Problem List   Diagnosis Date Noted  . GI bleed 12/20/2016  . Acute upper GI bleed   . Chronic anticoagulation    Home Medication(s) Prior to Admission medications   Medication Sig Start Date End Date Taking? Authorizing Provider  carvedilol (COREG) 25 MG tablet Take 25 mg by mouth 2 (two) times daily with a meal.    Yes [provider]  diltiazem (CARDIZEM CD) 120 MG 24 hr capsule Take 1 capsule (120 mg total) by mouth daily. 03/02/18  Yes Raeford Razor, MD  lisinopril (PRINIVIL,ZESTRIL) 40 MG tablet Take 40 mg by mouth daily.   Yes [provider]  pantoprazole (PROTONIX) 40 MG tablet Take 1 tablet (40 mg total) by mouth daily. 12/23/16  Yes Marquette Saa, MD  rivaroxaban (XARELTO) 20 MG TABS tablet Take 20 mg by mouth daily with breakfast.    Yes [provider]  rosuvastatin (CRESTOR) 20 MG tablet Take 20 mg by  mouth daily.    Yes [provider]  sildenafil (VIAGRA) 100 MG tablet Take 100 mg by mouth daily as needed for erectile dysfunction.   Yes [provider]  PRESCRIPTION MEDICATION Inhale into the lungs at bedtime. CPAP    [provider]  senna (SENOKOT) 8.6 MG TABS tablet Take 2 tablets (17.2 mg total) by mouth 2 (two) times daily as needed for mild constipation or moderate constipation. Patient not taking: Reported on 06/28/2018 02/14/17   Berton Bon, MD                                                                                                                                    Past Surgical History Past Surgical History:  Procedure Laterality Date  . CARDIAC SURGERY    . CERVICAL FUSION    . ESOPHAGOGASTRODUODENOSCOPY (EGD) WITH PROPOFOL N/A 12/21/2016   Procedure: ESOPHAGOGASTRODUODENOSCOPY (EGD) WITH PROPOFOL;  Surgeon: Levora Angel,  Darcus Austin, MD;  Location: [facility] ENDOSCOPY;  Service: Gastroenterology;  Laterality: N/A;  . TONSILLECTOMY     Family History No family history on file.  Social History Social History   Tobacco Use  . Smoking status: Current Every Day Smoker    Packs/day: 0.50  . Smokeless tobacco: Never Used  Substance Use Topics  . Alcohol use: No  . Drug use: No   Allergies Patient has no known allergies.  Review of Systems Review of Systems  Respiratory: Positive for shortness of breath.   Cardiovascular: Positive for palpitations. Negative for chest pain.  Gastrointestinal: Negative for abdominal pain.  Neurological: Positive for light-headedness. Negative for headaches.   All other systems are reviewed and are negative for acute change except as noted in the HPI  Physical Exam Vital Signs  I have reviewed the triage vital signs BP 107/65 (BP Location: Right Arm)   Pulse 63   Resp 18   SpO2 97%   Physical Exam Vitals signs reviewed.  Constitutional:      General: He is not in acute distress.    Appearance: He is  well-developed. He is not diaphoretic.  HENT:     Head: Normocephalic and atraumatic.     Nose: Nose normal.  Eyes:     General: No scleral icterus.       Right eye: No discharge.        Left eye: No discharge.     Conjunctiva/sclera: Conjunctivae normal.     Pupils: Pupils are equal, round, and reactive to light.  Neck:     Musculoskeletal: Normal range of motion and neck supple.  Cardiovascular:     Rate and Rhythm: Tachycardia present. Rhythm irregularly irregular.     Heart sounds: No murmur. No friction rub. No gallop.   Pulmonary:     Effort: Pulmonary effort is normal. No respiratory distress.     Breath sounds: Normal breath sounds. No stridor. No rales.  Chest:    Abdominal:     General: There is no distension.     Palpations: Abdomen is soft.     Tenderness: There is no abdominal tenderness.  Musculoskeletal:        General: No tenderness.  Skin:    General: Skin is warm and dry.     Findings: No erythema or rash.  Neurological:     Mental Status: He is alert and oriented to person, place, and time.     ED Results and Treatments Labs (all labs ordered are listed, but only abnormal results are displayed) Labs Reviewed  BASIC METABOLIC PANEL - Abnormal; Notable for the following components:      Result Value   CO2 21 (*)    Glucose, Bld 142 (*)    Calcium 8.7 (*)    All other components within normal limits  PROTIME-INR - Abnormal; Notable for the following components:   Prothrombin Time 17.7 (*)    All other components within normal limits  CBC  I-STAT TROPONIN, ED  EKG  EKG Interpretation  Date/Time:  Tuesday June 27 2018 21:37:39 EST Ventricular Rate:  127 PR Interval:    QRS Duration: 90 QT Interval:  317 QTC Calculation: 461 R Axis:   -12 Text Interpretation:  Atrial fibrillation with rvr Ventricular premature complex  Abnormal R-wave progression, early transition Inferior infarct, old Confirmed by Drema Pryardama, Marquest Gunkel [phone]([id number]) on 06/27/2018 10:59:45 PM       EKG Interpretation  Date/Time:  Wednesday June 28 2018 00:13:40 EST Ventricular Rate:  60 PR Interval:    QRS Duration: 108 QT Interval:  399 QTC Calculation: 399 R Axis:   -7 Text Interpretation:  Sinus rhythm Atrial premature complexes Probable left atrial enlargement Inferolateral infarct, old resolved a. fib Confirmed by Drema Pryardama, Ludger Bones [phone]([id number]) on 06/28/2018 12:31:13 AM       Radiology Dg Chest 2 View  Result Date: 06/27/2018 CLINICAL DATA:  Atrial fibrillation today. Shortness of breath. History of hypertension. EXAM: CHEST - 2 VIEW COMPARISON:  Chest radiograph March 02, 2018 FINDINGS: Cardiac silhouette is similarly enlarged. Status post median sternotomy for CABG. No pleural effusion or focal consolidation. No pneumothorax. Soft tissue planes included osseous structures are non suspicious. IMPRESSION: 1. Stable cardiomegaly.  No acute pulmonary process. Electronically Signed   By: Awilda Metroourtnay  Bloomer M.D.   On: 06/27/2018 22:44   Pertinent labs & imaging results that were available during my care of the patient were reviewed by me and considered in my medical decision making (see chart for details).  Medications Ordered in ED Medications  etomidate (AMIDATE) injection 20 mg (20 mg Intravenous See Procedure Record 06/28/18 0026)  sodium chloride flush (NS) 0.9 % injection 3 mL (3 mLs Intravenous Given 06/28/18 0027)  sodium chloride 0.9 % bolus 1,000 mL (0 mLs Intravenous Stopped 06/28/18 0027)  etomidate (AMIDATE) injection (12 mg Intravenous Given 06/28/18 0011)                                                                                                                                    Procedures .Cardioversion Date/Time: 06/28/2018 12:28 AM Performed by: Nira Connardama, Kinsey Cowsert Eduardo, MD Authorized by: Nira Connardama, Clarance Bollard Eduardo, MD   Consent:     Consent obtained:  Written   Consent given by:  Patient   Risks discussed:  Death and induced arrhythmia   Alternatives discussed:  No treatment Pre-procedure details:    Cardioversion basis:  Emergent   Rhythm:  Atrial fibrillation   Electrode placement:  Anterior-posterior Patient sedated: Yes. Refer to sedation procedure documentation for details of sedation.  Attempt one:    Cardioversion mode:  Synchronous   Waveform:  Biphasic   Shock (Joules):  120   Shock outcome:  Conversion to normal sinus rhythm Post-procedure details:    Patient status:  Alert   Patient tolerance of procedure:  Tolerated well, no immediate complications .Sedation Date/Time: 06/28/2018 12:28 AM Performed by: Nira Connardama, Jourdain Guay Eduardo, MD Authorized by: Nira Connardama, Alvenia Treese Eduardo, MD  Consent:    Consent obtained:  Verbal   Consent given by:  Patient   Risks discussed:  Allergic reaction, dysrhythmia, inadequate sedation, nausea, prolonged hypoxia resulting in organ damage, prolonged sedation necessitating reversal, respiratory compromise necessitating ventilatory assistance and intubation and vomiting   Alternatives discussed:  Analgesia without sedation, anxiolysis and regional anesthesia Universal protocol:    Procedure explained and questions answered to patient or proxy's satisfaction: yes     Relevant documents present and verified: yes     Test results available and properly labeled: yes     Imaging studies available: yes     Required blood products, implants, devices, and special equipment available: yes     Site/side marked: yes     Immediately prior to procedure a time out was called: yes     Patient identity confirmation method:  Verbally with patient Indications:    Procedure necessitating sedation performed by:  Physician performing sedation Pre-sedation assessment:    ASA classification: class 2 - patient with mild systemic disease     Neck mobility: normal     Mouth opening:  3 or more  finger widths   Thyromental distance:  4 finger widths   Mallampati score:  I - soft palate, uvula, fauces, pillars visible   Pre-sedation assessments completed and reviewed: airway patency, cardiovascular function, hydration status, mental status, nausea/vomiting, pain level, respiratory function and temperature     Pre-sedation assessment completed:  06/27/2018 11:10 PM Immediate pre-procedure details:    Reassessment: Patient reassessed immediately prior to procedure     Reviewed: vital signs, relevant labs/tests and NPO status     Verified: bag valve mask available, emergency equipment available, intubation equipment available, IV patency confirmed, oxygen available and suction available   Procedure details (see MAR for exact dosages):    Preoxygenation:  Nasal cannula   Sedation:  Etomidate   Intra-procedure monitoring:  Blood pressure monitoring, cardiac monitor, continuous pulse oximetry, frequent LOC assessments, frequent vital sign checks and continuous capnometry   Intra-procedure events: none     Intra-procedure management:  Supplemental oxygen and airway suctioning   Total Provider sedation time (minutes):  10 Post-procedure details:    Post-sedation assessment completed:  06/28/2018 12:29 AM   Attendance: Constant attendance by certified staff until patient recovered     Recovery: Patient returned to pre-procedure baseline     Post-sedation assessments completed and reviewed: airway patency, cardiovascular function, hydration status, mental status, nausea/vomiting, pain level, respiratory function and temperature     Patient is stable for discharge or admission: yes     Patient tolerance:  Tolerated well, no immediate complications   CRITICAL CARE Performed by: Amadeo GarnetPedro Eduardo Annalie Wenner Total critical care time: 45 minutes Critical care time was exclusive of separately billable procedures and treating other patients. Critical care was necessary to treat or prevent imminent or  life-threatening deterioration. Critical care was time spent personally by me on the following activities: development of treatment plan with patient and/or surrogate as well as nursing, discussions with consultants, evaluation of patient's response to treatment, examination of patient, obtaining history from patient or surrogate, ordering and performing treatments and interventions, ordering and review of laboratory studies, ordering and review of radiographic studies, pulse oximetry and re-evaluation of patient's condition. \ (including critical care time)  Medical Decision Making / ED Course I have reviewed the nursing notes for this encounter and the patient's prior records (if available in EHR or on provided paperwork).    Patient presents with A. fib  RVR.  Noted to be hypotensive with systolics in the 80s.  He is mentating fine at this time.  Denies any recent fevers or infections.  No recent vomiting or diarrhea.  Currently denies any chest pain or shortness of breath at this time.  Patient is adamant about the onset time and that he has not missed any doses of his Xarelto in the last 3 weeks.  Labs drawn in triage were grossly reassuring without evidence of anemia, electrolyte derangements or renal insufficiency.  EKG with A. fib RVR with a rate in 120s.  Started on IVF bolus.  Appropriate for emergent cardioversion; performed as above. Patient returned to normal sinus rhythm with a rate in the 60s.  Blood pressure improved with systolics in the 130s.  1:10 AM Patient remains in normal sinus rhythm and hemodynamically stable.  2:25 AM Patient remains in normal sinus rhythm and hemodynamically stable.  Recommended he follow-up closely with cardiology.  Since he goes to the [place] it might be difficult for him to be seen in a next week.  He was given a referral to the A. fib clinic for close follow-up.  The patient is safe for discharge with strict return precautions.   Final  Clinical Impression(s) / ED Diagnoses Final diagnoses:  Atrial fibrillation with rapid ventricular response (HCC)  Other specified hypotension   Disposition: Discharge  Condition: Good  I have discussed the results, Dx and Tx plan with the patient and wife who expressed understanding and agree(s) with the plan. Discharge instructions discussed at great length. The patient and wife was given strict return precautions who verbalized understanding of the instructions. No further questions at time of discharge.    ED Discharge Orders         Ordered    Amb Referral to AFIB Clinic    Comments:  Post cardioversion follow up   06/28/18 0033           Follow Up: Newman Nip, NP [address] [place] [address] [phone]  Call  If you do not receive a call within 2 to 3 days for a follow-up appointment      This chart was dictated using voice recognition software.  Despite best efforts to proofread,  errors can occur which can change the documentation meaning.   Nira Conn, MD 06/28/18 0235  Added CHADS-VASC score   Seven Dollens, Amadeo Garnet, MD 06/29/18 [phone]

## 2018-06-27 NOTE — ED Triage Notes (Signed)
Pt presents to ED for evaluation of afib with hx of same. Has taken all meds as prescribed but today has been having increased palpitations, sweating, headache, and dizziness. Denies cp.

## 2018-06-28 MED ORDER — ETOMIDATE 2 MG/ML IV SOLN
INTRAVENOUS | Status: AC | PRN
  Administered 2018-06-28: 12 mg via INTRAVENOUS

## 2018-06-30 ENCOUNTER — Telehealth (HOSPITAL_COMMUNITY): Payer: Self-pay | Admitting: *Deleted

## 2018-06-30 NOTE — Telephone Encounter (Signed)
Pt seen in ED for afib.  I cld pt to offer follow up appt.  Pt prefers to be seen at the [facility] and has made an appt for the  [place] this coming Wednesday.  He will call us if there is change.

## 2018-11-29 DIAGNOSIS — F1490 Cocaine use, unspecified, uncomplicated: Secondary | ICD-10-CM | POA: Insufficient documentation

## 2019-02-16 ENCOUNTER — Ambulatory Visit: Payer: Self-pay | Admitting: Orthopedic Surgery

## 2019-02-16 NOTE — H&P (View-Only) (Signed)
TOTAL HIP ADMISSION H&P  Patient is admitted for left total hip arthroplasty.  Subjective:  Chief Complaint: left hip pain  HPI: Cameron Schultz, 63 y.o. 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  Onset of symptoms was gradual starting 2 years ago with rapidlly worsening course since that time.The patient noted no past surgery on the left hip(s).  Patient currently rates pain in the left hip at 10 out of 10 with activity. Patient has night pain, worsening of pain with activity and weight bearing, pain that interfers with activities of daily living and pain with passive range of motion. Patient has evidence of AVN by imaging studies. This condition presents safety issues increasing the risk of falls.  There is no current active infection.  Patient Active Problem List   Diagnosis Date Noted  . GI bleed 12/20/2016  . Acute upper GI bleed   . Chronic anticoagulation    Past Medical History:  Diagnosis Date  . Atrial fibrillation (HCC)   . Coronary artery disease   . Hypertension     Past Surgical History:  Procedure Laterality Date  . CARDIAC SURGERY    . CERVICAL FUSION    . ESOPHAGOGASTRODUODENOSCOPY (EGD) WITH PROPOFOL N/A 12/21/2016   Procedure: ESOPHAGOGASTRODUODENOSCOPY (EGD) WITH PROPOFOL;  Surgeon: Brahmbhatt, Parag, MD;  Location: [facility] ENDOSCOPY;  Service: Gastroenterology;  Laterality: N/A;  . TONSILLECTOMY      Current Outpatient Medications  Medication Sig Dispense Refill Last Dose  . carvedilol (COREG) 25 MG tablet Take 25 mg by mouth 2 (two) times daily with a meal.    06/27/2018 at 1800  . diltiazem (CARDIZEM CD) 120 MG 24 hr capsule Take 1 capsule (120 mg total) by mouth daily. 30 capsule  0 06/27/2018 at Unknown time  . lisinopril (PRINIVIL,ZESTRIL) 40 MG tablet Take 40 mg by mouth daily.   06/27/2018 at Unknown time  . pantoprazole (PROTONIX) 40 MG tablet Take 1 tablet (40 mg total) by mouth daily. 30 tablet 0 06/27/2018 at Unknown time  . PRESCRIPTION MEDICATION Inhale into the lungs at bedtime. CPAP   02/13/2017 at Unknown time  . rivaroxaban (XARELTO) 20 MG TABS tablet Take 20 mg by mouth daily with breakfast.    06/27/2018 at 0900  . rosuvastatin (CRESTOR) 20 MG tablet Take 20 mg by mouth daily.    06/27/2018 at Unknown time  . senna (SENOKOT) 8.6 MG TABS tablet Take 2 tablets (17.2 mg total) by mouth 2 (two) times daily as needed for mild constipation or moderate constipation. (Patient not taking: Reported on 06/28/2018) 120 each 0 Completed Course at Unknown time  . sildenafil (VIAGRA) 100 MG tablet Take 100 mg by mouth daily as needed for erectile dysfunction.   Past Month at Unknown time   No current facility-administered medications for this visit.    No Known Allergies  Social History   Tobacco Use  . Smoking status: Current Every Day Smoker    Packs/day: 0.50  . Smokeless tobacco: Never Used  Substance Use Topics  . Alcohol use: No    No family history on file.   Review of Systems  Constitutional: Negative.   HENT: Positive for tinnitus.   Eyes: Positive for blurred vision.  Respiratory: Positive for shortness of breath.   Cardiovascular: Negative.     Gastrointestinal: Negative.   Genitourinary: Negative.   Musculoskeletal: Positive for back pain, joint pain and myalgias.  Skin: Negative.   Neurological: Negative.   Endo/Heme/Allergies: Negative.   Psychiatric/Behavioral: Negative.     Objective:  Physical Exam  Vitals reviewed. Constitutional: He is oriented to person, place, and time. He appears well-developed and well-nourished.  HENT:  Head: Normocephalic and atraumatic.  Eyes: Pupils are equal, round, and reactive to light. Conjunctivae and EOM  are normal.  Neck: Normal range of motion. Neck supple.  Cardiovascular: Normal rate and intact distal pulses.  Irregular rhythm  Respiratory: Effort normal. No respiratory distress.  GI: Soft. He exhibits no distension.  Genitourinary:    Genitourinary Comments: deferred   Musculoskeletal:     Right shoulder: He exhibits decreased range of motion and bony tenderness.  Neurological: He is alert and oriented to person, place, and time. He has normal reflexes.  Skin: Skin is warm and dry.  Psychiatric: He has a normal mood and affect. His behavior is normal. Judgment and thought content normal.    Vital signs in last 24 hours: @VSRANGES @  Labs:   Estimated body mass index is 34.67 kg/m as calculated from the following:   Height as of 03/02/18: 6\' 2"  (1.88 m).   Weight as of 03/02/18: 122.5 kg.   Imaging Review Plain radiographs demonstrate moderate degenerative joint disease of the left hip(s). The bone quality appears to be adequate for age and reported activity level.      Assessment/Plan:  AVN, left hip(s)  The patient history, physical examination, clinical judgement of the provider and imaging studies are consistent with end stage degenerative joint disease of the left hip(s) and total hip arthroplasty is deemed medically necessary. The treatment options including medical management, injection therapy, arthroscopy and arthroplasty were discussed at length. The risks and benefits of total hip arthroplasty were presented and reviewed. The risks due to aseptic loosening, infection, stiffness, dislocation/subluxation,  thromboembolic complications and other imponderables were discussed.  The patient acknowledged the explanation, agreed to proceed with the plan and consent was signed. Patient is being admitted for inpatient treatment for surgery, pain control, PT, OT, prophylactic antibiotics, VTE prophylaxis, progressive ambulation and ADL's and discharge planning.The patient is  planning to be discharged home with HEP   Anticipated LOS equal to or greater than 2 midnights due to - Age 62 and older with one or more of the following:  - Obesity  - Expected need for hospital services (PT, OT, Nursing) required for safe  discharge  - Anticipated need for postoperative skilled nursing care or inpatient rehab  - Active co-morbidities: Cardiac Arrhythmia OR   - Unanticipated findings during/Post Surgery: None  - Patient is a high risk of re-admission due to: None

## 2019-02-16 NOTE — H&P (Signed)
TOTAL HIP ADMISSION H&P  Patient is admitted for left total hip arthroplasty.  Subjective:  Chief Complaint: left hip pain  HPI: Cameron Schultz, 63 y.o. 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  Onset of symptoms was gradual starting 2 years ago with rapidlly worsening course since that time.The patient noted no past surgery on the left hip(s).  Patient currently rates pain in the left hip at 10 out of 10 with activity. Patient has night pain, worsening of pain with activity and weight bearing, pain that interfers with activities of daily living and pain with passive range of motion. Patient has evidence of AVN by imaging studies. This condition presents safety issues increasing the risk of falls.  There is no current active infection.  Patient Active Problem List   Diagnosis Date Noted  . GI bleed 12/20/2016  . Acute upper GI bleed   . Chronic anticoagulation    Past Medical History:  Diagnosis Date  . Atrial fibrillation (HCC)   . Coronary artery disease   . Hypertension     Past Surgical History:  Procedure Laterality Date  . CARDIAC SURGERY    . CERVICAL FUSION    . ESOPHAGOGASTRODUODENOSCOPY (EGD) WITH PROPOFOL N/A 12/21/2016   Procedure: ESOPHAGOGASTRODUODENOSCOPY (EGD) WITH PROPOFOL;  Surgeon: Kathi DerBrahmbhatt, Parag, MD;  Location: [facility];  Service: Gastroenterology;  Laterality: N/A;  . TONSILLECTOMY      Current Outpatient Medications  Medication Sig Dispense Refill Last Dose  . carvedilol (COREG) 25 MG tablet Take 25 mg by mouth 2 (two) times daily with a meal.    06/27/2018 at 1800  . diltiazem (CARDIZEM CD) 120 MG 24 hr capsule Take 1 capsule (120 mg total) by mouth daily. 30 capsule  0 06/27/2018 at Unknown time  . lisinopril (PRINIVIL,ZESTRIL) 40 MG tablet Take 40 mg by mouth daily.   06/27/2018 at Unknown time  . pantoprazole (PROTONIX) 40 MG tablet Take 1 tablet (40 mg total) by mouth daily. 30 tablet 0 06/27/2018 at Unknown time  . PRESCRIPTION MEDICATION Inhale into the lungs at bedtime. CPAP   02/13/2017 at Unknown time  . rivaroxaban (XARELTO) 20 MG TABS tablet Take 20 mg by mouth daily with breakfast.    06/27/2018 at 0900  . rosuvastatin (CRESTOR) 20 MG tablet Take 20 mg by mouth daily.    06/27/2018 at Unknown time  . senna (SENOKOT) 8.6 MG TABS tablet Take 2 tablets (17.2 mg total) by mouth 2 (two) times daily as needed for mild constipation or moderate constipation. (Patient not taking: Reported on 06/28/2018) 120 each 0 Completed Course at Unknown time  . sildenafil (VIAGRA) 100 MG tablet Take 100 mg by mouth daily as needed for erectile dysfunction.   Past Month at Unknown time   No current facility-administered medications for this visit.    No Known Allergies  Social History   Tobacco Use  . Smoking status: Current Every Day Smoker    Packs/day: 0.50  . Smokeless tobacco: Never Used  Substance Use Topics  . Alcohol use: No    No family history on file.   Review of Systems  Constitutional: Negative.   HENT: Positive for tinnitus.   Eyes: Positive for blurred vision.  Respiratory: Positive for shortness of breath.   Cardiovascular: Negative.  Gastrointestinal: Negative.   Genitourinary: Negative.   Musculoskeletal: Positive for back pain, joint pain and myalgias.  Skin: Negative.   Neurological: Negative.   Endo/Heme/Allergies: Negative.   Psychiatric/Behavioral: Negative.     Objective:  Physical Exam  Vitals reviewed. Constitutional: He is oriented to person, place, and time. He appears well-developed and well-nourished.  HENT:  Head: Normocephalic and atraumatic.  Eyes: Pupils are equal, round, and reactive to light. Conjunctivae and EOM  are normal.  Neck: Normal range of motion. Neck supple.  Cardiovascular: Normal rate and intact distal pulses.  Irregular rhythm  Respiratory: Effort normal. No respiratory distress.  GI: Soft. He exhibits no distension.  Genitourinary:    Genitourinary Comments: deferred   Musculoskeletal:     Right shoulder: He exhibits decreased range of motion and bony tenderness.  Neurological: He is alert and oriented to person, place, and time. He has normal reflexes.  Skin: Skin is warm and dry.  Psychiatric: He has a normal mood and affect. His behavior is normal. Judgment and thought content normal.    Vital signs in last 24 hours: @VSRANGES @  Labs:   Estimated body mass index is 34.67 kg/m as calculated from the following:   Height as of 03/02/18: 6\' 2"  (1.88 m).   Weight as of 03/02/18: 122.5 kg.   Imaging Review Plain radiographs demonstrate moderate degenerative joint disease of the left hip(s). The bone quality appears to be adequate for age and reported activity level.      Assessment/Plan:  AVN, left hip(s)  The patient history, physical examination, clinical judgement of the provider and imaging studies are consistent with end stage degenerative joint disease of the left hip(s) and total hip arthroplasty is deemed medically necessary. The treatment options including medical management, injection therapy, arthroscopy and arthroplasty were discussed at length. The risks and benefits of total hip arthroplasty were presented and reviewed. The risks due to aseptic loosening, infection, stiffness, dislocation/subluxation,  thromboembolic complications and other imponderables were discussed.  The patient acknowledged the explanation, agreed to proceed with the plan and consent was signed. Patient is being admitted for inpatient treatment for surgery, pain control, PT, OT, prophylactic antibiotics, VTE prophylaxis, progressive ambulation and ADL's and discharge planning.The patient is  planning to be discharged home with HEP   Anticipated LOS equal to or greater than 2 midnights due to - Age 62 and older with one or more of the following:  - Obesity  - Expected need for hospital services (PT, OT, Nursing) required for safe  discharge  - Anticipated need for postoperative skilled nursing care or inpatient rehab  - Active co-morbidities: Cardiac Arrhythmia OR   - Unanticipated findings during/Post Surgery: None  - Patient is a high risk of re-admission due to: None

## 2019-02-19 ENCOUNTER — Other Ambulatory Visit (HOSPITAL_COMMUNITY)
Admission: RE | Admit: 2019-02-19 | Discharge: 2019-02-19 | Disposition: A | Discharge status: Home / Self Care | Payer: No Typology Code available for payment source | Source: Ambulatory Visit | Attending: Orthopedic Surgery | Admitting: Orthopedic Surgery

## 2019-02-20 ENCOUNTER — Ambulatory Visit: Payer: Self-pay | Admitting: Orthopedic Surgery

## 2019-02-20 NOTE — Patient Instructions (Addendum)
DUE TO COVID-19 ONLY ONE VISITOR IS ALLOWED TO COME WITH YOU AND STAY IN THE WAITING ROOM ONLY DURING PRE OP AND PROCEDURE DAY OF SURGERY.  THE 1 VISITOR MAY VISIT WITH YOU AFTER SURGERY IN YOUR PRIVATE ROOM DURING VISITING HOURS ONLY!   ONCE YOUR COVID TEST IS COMPLETED, PLEASE BEGIN THE QUARANTINE INSTRUCTIONS AS OUTLINED IN YOUR HANDOUT.                [facility]    Your procedure is scheduled [id number] 02/22/19.   Report to [facility] Main  Entrance   Report to admitting at     8:30 AM     Call this number if you have problems the morning of surgery [phone]   . BRUSH YOUR TEETH MORNING OF SURGERY AND RINSE YOUR MOUTH OUT, NO CHEWING GUM CANDY OR MINTS.    CLEAR LIQUID DIET   Foods Allowed                                                                     Foods Excluded  Coffee and tea, regular and decaf                             liquids that you cannot  Plain Jell-O any favor except red or purple                                           see through such as: Fruit ices (not with fruit pulp)                                     milk, soups, orange juice  Iced Popsicles                                    All solid food Carbonated beverages, regular and diet                                    Cranberry, grape and apple juices Sports drinks like Gatorade Lightly seasoned clear broth or consume(fat free) Sugar, honey syrup    Do not eat food After Midnight.    YOU MAY HAVE CLEAR LIQUIDS FROM MIDNIGHT UNTIL 8:00 am  . At 8:00 AM Please finish the prescribed Pre-Surgery  drink.   Nothing by mouth after you finish the  drink !   Take these medicines the morning of surgery with A SIP OF WATER: Coreg, Cardizem, Protonix                                 You may not have any metal on your body including piercings              Do not wear jewelry,  lotions, powders or deodorant  Men may shave face and neck.   Do not bring  valuables to the hospital. [facility] IS NOT             RESPONSIBLE   FOR VALUABLES.  Contacts, dentures or bridgework may not be worn into surgery.        Special Instructions: N/A              Please read over the following fact sheets you were given: _____________________________________________________________________             [facility] - Preparing for [facility]  Before surgery, you can play an important role .  Because skin is not sterile, your skin needs to be as free of germs as possible.   You can reduce the number of germs on your skin by washing with CHG (chlorahexidine gluconate) soap before surgery.   CHG is an antiseptic cleaner which kills germs and bonds with the skin to continue killing germs even after washing. Please DO NOT use if you have an allergy to CHG or antibacterial soaps.   If your skin becomes reddened/irritated stop using the CHG and inform your nurse when you arrive at Short Stay.   You may shave your face/neck.  Please follow these instructions carefully:  1.  Shower with CHG Soap the night before surgery and the  morning of Surgery.  2.  If you choose to wash your hair, wash your hair first as usual with your  normal  shampoo.  3.  After you shampoo, rinse your hair and body thoroughly to remove the  shampoo.                                        4.  Use CHG as you would any other liquid soap.  You can apply chg directly  to the skin and wash                       Gently with a scrungie or clean washcloth.  5.  Apply the CHG Soap to your body ONLY FROM THE NECK DOWN.   Do not use on face/ open                           Wound or open sores. Avoid contact with eyes, ears mouth and genitals (private parts).                       Wash face,  Genitals (private parts) with your normal soap.             6.  Wash thoroughly, paying special attention to the area where your surgery  will be performed.  7.  Thoroughly rinse your body with warm water from  the neck down.  8.  DO NOT shower/wash with your normal soap after using and rinsing off  the CHG Soap.             9.  Pat yourself dry with a clean towel.            10.  Wear clean pajamas.            11.  Place clean sheets on your bed the night of your first shower and do not  sleep with pets. Day of Surgery : Do not apply any  lotions/deodorants the morning of surgery.  Please wear clean clothes to the hospital/surgery center.  FAILURE TO FOLLOW THESE INSTRUCTIONS MAY RESULT IN THE CANCELLATION OF YOUR SURGERY PATIENT SIGNATURE_________________________________  NURSE SIGNATURE__________________________________  ________________________________________________________________________   [facility]  An incentive spirometer is a tool that can help keep your lungs clear and active. This tool measures how well you are filling your lungs with each breath. Taking long deep breaths may help reverse or decrease the chance of developing breathing (pulmonary) problems (especially infection) following:  A long period of time when you are unable to move or be active. BEFORE THE PROCEDURE   If the spirometer includes an indicator to show your best effort, your nurse or respiratory therapist will set it to a desired goal.  If possible, sit up straight or lean slightly forward. Try not to slouch.  Hold the incentive spirometer in an upright position. INSTRUCTIONS FOR USE  1. Sit on the edge of your bed if possible, or sit up as far as you can in bed or on a chair. 2. Hold the incentive spirometer in an upright position. 3. Breathe out normally. 4. Place the mouthpiece in your mouth and seal your lips tightly around it. 5. Breathe in slowly and as deeply as possible, raising the piston or the ball toward the top of the column. 6. Hold your breath for 3-5 seconds or for as long as possible. Allow the piston or ball to fall to the bottom of the column. 7. Remove the mouthpiece from your  mouth and breathe out normally. 8. Rest for a few seconds and repeat Steps 1 through 7 at least 10 times every 1-2 hours when you are awake. Take your time and take a few normal breaths between deep breaths. 9. The spirometer may include an indicator to show your best effort. Use the indicator as a goal to work toward during each repetition. 10. After each set of 10 deep breaths, practice coughing to be sure your lungs are clear. If you have an incision (the cut made at the time of surgery), support your incision when coughing by placing a pillow or rolled up towels firmly against it. Once you are able to get out of bed, walk around indoors and cough well. You may stop using the incentive spirometer when instructed by your caregiver.  RISKS AND COMPLICATIONS  Take your time so you do not get dizzy or light-headed.  If you are in pain, you may need to take or ask for pain medication before doing incentive spirometry. It is harder to take a deep breath if you are having pain. AFTER USE  Rest and breathe slowly and easily.  It can be helpful to keep track of a log of your progress. Your caregiver can provide you with a simple table to help with this. If you are using the spirometer at home, follow these instructions: SEEK MEDICAL CARE IF:   You are having difficultly using the spirometer.  You have trouble using the spirometer as often as instructed.  Your pain medication is not giving enough relief while using the spirometer.  You develop fever of 100.5 F (38.1 C) or higher. SEEK IMMEDIATE MEDICAL CARE IF:   You cough up bloody sputum that had not been present before.  You develop fever of 102 F (38.9 C) or greater.  You develop worsening pain at or near the incision site. MAKE SURE YOU:   Understand these instructions.  Will watch your condition.  Will get help  right away if you are not doing well or get worse. Document Released: 09/27/2006 Document Revised: 08/09/2011  Document Reviewed: 11/28/2006 Exit[employer] Patient Information 2014 Exit[employer], [place].   ________________________________________________________________________  WHAT IS A BLOOD TRANSFUSION? Blood Transfusion Information  A transfusion is the replacement of blood or some of its parts. Blood is made up of multiple cells which provide different functions.  Red blood cells carry oxygen and are used for blood loss replacement.  White blood cells fight against infection.  Platelets control bleeding.  Plasma helps clot blood.  Other blood products are available for specialized needs, such as hemophilia or other clotting disorders. BEFORE THE TRANSFUSION  Who gives blood for transfusions?   Healthy volunteers who are fully evaluated to make sure their blood is safe. This is blood bank blood. Transfusion therapy is the safest it has ever been in the practice of medicine. Before blood is taken from a donor, a complete history is taken to make sure that person has no history of diseases nor engages in risky social behavior (examples are intravenous drug use or sexual activity with multiple partners). The donor's travel history is screened to minimize risk of transmitting infections, such as malaria. The donated blood is tested for signs of infectious diseases, such as HIV and hepatitis. The blood is then tested to be sure it is compatible with you in order to minimize the chance of a transfusion reaction. If you or a relative donates blood, this is often done in anticipation of surgery and is not appropriate for emergency situations. It takes many days to process the donated blood. RISKS AND COMPLICATIONS Although transfusion therapy is very safe and saves many lives, the main dangers of transfusion include:   Getting an infectious disease.  Developing a transfusion reaction. This is an allergic reaction to something in the blood you were given. Every precaution is taken to prevent this. The decision  to have a blood transfusion has been considered carefully by your caregiver before blood is given. Blood is not given unless the benefits outweigh the risks. AFTER THE TRANSFUSION  Right after receiving a blood transfusion, you will usually feel much better and more energetic. This is especially true if your red blood cells have gotten low (anemic). The transfusion raises the level of the red blood cells which carry oxygen, and this usually causes an energy increase.  The nurse administering the transfusion will monitor you carefully for complications. HOME CARE INSTRUCTIONS  No special instructions are needed after a transfusion. You may find your energy is better. Speak with your caregiver about any limitations on activity for underlying diseases you may have. SEEK MEDICAL CARE IF:   Your condition is not improving after your transfusion.  You develop redness or irritation at the intravenous (IV) site. SEEK IMMEDIATE MEDICAL CARE IF:  Any of the following symptoms occur over the next 12 hours:  Shaking chills.  You have a temperature by mouth above 102 F (38.9 C), not controlled by medicine.  Chest, back, or muscle pain.  People around you feel you are not acting correctly or are confused.  Shortness of breath or difficulty breathing.  Dizziness and fainting.  You get a rash or develop hives.  You have a decrease in urine output.  Your urine turns a dark color or changes to pink, red, or brown. Any of the following symptoms occur over the next 10 days:  You have a temperature by mouth above 102 F (38.9 C), not controlled by medicine.  Shortness of breath.  Weakness after normal activity.  The white part of the eye turns yellow (jaundice).  You have a decrease in the amount of urine or are urinating less often.  Your urine turns a dark color or changes to pink, red, or brown. Document Released: 05/14/2000 Document Revised: 08/09/2011 Document Reviewed:  01/01/2008 [facility] Patient Information 2014 [place], [place].  _______________________________________________________________________

## 2019-02-21 ENCOUNTER — Other Ambulatory Visit: Payer: Self-pay

## 2019-02-21 ENCOUNTER — Encounter (HOSPITAL_COMMUNITY): Payer: Self-pay

## 2019-02-21 ENCOUNTER — Encounter (HOSPITAL_COMMUNITY)
Admission: RE | Admit: 2019-02-21 | Discharge: 2019-02-21 | Disposition: A | Discharge status: Home / Self Care | Payer: No Typology Code available for payment source | Source: Ambulatory Visit | Attending: Orthopedic Surgery | Admitting: Orthopedic Surgery

## 2019-02-21 DIAGNOSIS — M879 Osteonecrosis, unspecified: Secondary | ICD-10-CM | POA: Insufficient documentation

## 2019-02-21 DIAGNOSIS — R001 Bradycardia, unspecified: Secondary | ICD-10-CM | POA: Insufficient documentation

## 2019-02-21 DIAGNOSIS — I4891 Unspecified atrial fibrillation: Secondary | ICD-10-CM | POA: Insufficient documentation

## 2019-02-21 DIAGNOSIS — Z0184 Encounter for antibody response examination: Secondary | ICD-10-CM | POA: Insufficient documentation

## 2019-02-21 DIAGNOSIS — Z01812 Encounter for preprocedural laboratory examination: Secondary | ICD-10-CM | POA: Insufficient documentation

## 2019-02-21 HISTORY — DX: Sleep apnea, unspecified: G47.30

## 2019-02-21 HISTORY — DX: Acute myocardial infarction, unspecified: I21.9

## 2019-02-21 LAB — COMPREHENSIVE METABOLIC PANEL
ALT: 22 U/L (ref 0–44)
AST: 21 U/L (ref 15–41)
Albumin: 3.6 g/dL (ref 3.5–5.0)
Alkaline Phosphatase: 76 U/L (ref 38–126)
Anion gap: 8 (ref 5–15)
BUN: 13 mg/dL (ref 8–23)
CO2: 21 mmol/L — ABNORMAL LOW (ref 22–32)
Calcium: 8.7 mg/dL — ABNORMAL LOW (ref 8.9–10.3)
Chloride: 110 mmol/L (ref 98–111)
Creatinine, Ser: 0.71 mg/dL (ref 0.61–1.24)
GFR calc Af Amer: >60 mL/min (ref >60)
GFR calc non Af Amer: >60 mL/min (ref >60)
Glucose, Bld: 93 mg/dL (ref 70–99)
Potassium: 4.1 mmol/L (ref 3.5–5.1)
Sodium: 139 mmol/L (ref 135–145)
Total Bilirubin: 0.8 mg/dL (ref 0.3–1.2)
Total Protein: 7.7 g/dL (ref 6.5–8.1)

## 2019-02-21 LAB — URINALYSIS, ROUTINE W REFLEX MICROSCOPIC
Bilirubin Urine: NEGATIVE
Glucose, UA: NEGATIVE mg/dL
Hgb urine dipstick: NEGATIVE
Ketones, ur: NEGATIVE mg/dL
Leukocytes,Ua: NEGATIVE
Nitrite: NEGATIVE
Protein, ur: 100 mg/dL — AB
Specific Gravity, Urine: 1.024 (ref 1.005–1.030)
pH: 5 (ref 5.0–8.0)

## 2019-02-21 LAB — PROTIME-INR
INR: 1.1 (ref 0.8–1.2)
Prothrombin Time: 13.9 seconds (ref 11.4–15.2)

## 2019-02-21 LAB — CBC
HCT: 38.7 % — ABNORMAL LOW (ref 39.0–52.0)
Hemoglobin: 12.1 g/dL — ABNORMAL LOW (ref 13.0–17.0)
MCH: 26.8 pg (ref 26.0–34.0)
MCHC: 31.3 g/dL (ref 30.0–36.0)
MCV: 85.8 fL (ref 80.0–100.0)
Platelets: 174 10*3/uL (ref 150–400)
RBC: 4.51 MIL/uL (ref 4.22–5.81)
RDW: 15.7 % — ABNORMAL HIGH (ref 11.5–15.5)
WBC: 6.1 10*3/uL (ref 4.0–10.5)
nRBC: 0 % (ref 0.0–0.2)

## 2019-02-21 LAB — SURGICAL PCR SCREEN
MRSA, PCR: NEGATIVE
Staphylococcus aureus: NEGATIVE

## 2019-02-21 LAB — ABO/RH: ABO/RH(D): O POS

## 2019-02-21 LAB — NOVEL CORONAVIRUS, NAA (HOSP ORDER, SEND-OUT TO REF LAB; TAT 18-24 HRS): SARS-CoV-2, NAA: NOT DETECTED

## 2019-02-21 NOTE — Anesthesia Preprocedure Evaluation (Addendum)
Anesthesia Evaluation  Patient identified by MRN, date of birth, ID band Patient awake    Reviewed: Allergy & Precautions, NPO status , Patient's Chart, lab work & pertinent test results  Airway Mallampati: II  TM Distance: >3 FB Neck ROM: Full    Dental no notable dental hx. (+) Teeth Intact, Dental Advisory Given,    Pulmonary neg pulmonary ROS, Current Smoker and Patient abstained from smoking.,    Pulmonary exam normal breath sounds clear to auscultation       Cardiovascular Exercise Tolerance: Good hypertension, Pt. on medications and Pt. on home beta blockers + CAD  Normal cardiovascular exam+ dysrhythmias Atrial Fibrillation  Rhythm:Regular Rate:Normal     Neuro/Psych negative neurological ROS  negative psych ROS   GI/Hepatic Neg liver ROS, GERD  Medicated,  Endo/Other  negative endocrine ROS  Renal/GU K+ 4.1 Cr 0.71     Musculoskeletal negative musculoskeletal ROS (+)   Abdominal (+) + obese,   Peds  Hematology Hgb 12.1 Plt 174   Anesthesia Other Findings   Reproductive/Obstetrics negative OB ROS                          Anesthesia Physical Anesthesia Plan  ASA: III  Anesthesia Plan: Spinal and Regional   Post-op Pain Management:    Induction:   PONV Risk Score and Plan: Treatment may vary due to age or medical condition and Ondansetron  Airway Management Planned: Nasal Cannula and Natural Airway  Additional Equipment: None  Intra-op Plan:   Post-operative Plan:   Informed Consent: I have reviewed the patients History and Physical, chart, labs and discussed the procedure including the risks, benefits and alternatives for the proposed anesthesia with the patient or authorized representative who has indicated his/her understanding and acceptance.     Dental advisory given  Plan Discussed with: CRNA  Anesthesia Plan Comments: (Check last xarelto dose 9/19 Spinal )        Anesthesia Quick Evaluation

## 2019-02-21 NOTE — Progress Notes (Signed)
Anesthesia Chart Review   Case: [id number] Date/Time: 02/22/19 1045   Procedure: TOTAL HIP ARTHROPLASTY ANTERIOR APPROACH (Left Hip)   Anesthesia type: Spinal   Pre-op diagnosis: avascular necrosis of bone of hip   Location: [facility] ROOM 08 / [facility]   Surgeon: Samson Frederic, MD      DISCUSSION:63 y.o. current every day smoker (3.75 pack years) with h/o Afib (on Xarelto), HTN, CAD (MI, CABG 2013), sleep apnea, AVN left hip scheduled for above procedure 02/22/2019 with Dr. Samson Frederic.   Cleared by PCP, Dr. [name].   Cleared by cardiology 01/31/2019 per Lorelle Gibbs, PA-C, on chart.  Advised to hold Xarelto 5 days prior to procedure.  Last dose 02/17/2019.   Anticipate pt can proceed with planned procedure barring acute status change.   VS: BP 135/86   Pulse 60   Temp 37.1 C (Oral)   Resp 18   Ht 6\' 2"  (1.88 m)   Wt 128.8 kg   SpO2 100%   BMI 36.46 kg/m   PROVIDERS: , MD is PCP   Se[facility]ology at [facility]  LABS: Labs reviewed: Acceptable for surgery. (all labs ordered are listed, but only abnormal results are displayed)  Labs Reviewed  CBC - Abnormal; Notable for the following components:      Result Value   Hemoglobin 12.1 (*)    HCT 38.7 (*)    RDW 15.7 (*)    All other components within normal limits  COMPREHENSIVE METABOLIC PANEL - Abnormal; Notable for the following components:   CO2 21 (*)    Calcium 8.7 (*)    All other components within normal limits  URINALYSIS, ROUTINE W REFLEX MICROSCOPIC - Abnormal; Notable for the following components:   Protein, ur 100 (*)    Bacteria, UA RARE (*)    All other components within normal limits  SURGICAL PCR SCREEN  PROTIME-INR  TYPE AND SCREEN  ABO/RH     IMAGES:   EKG: 02/21/2019 Rate 56 bpm Sinus bradycardia  Minimal voltage criteria for LVH, may be normal variant  No significant change since last tracing  CV: Echo 11/29/2018 Summary:  1. Normal global left ventricular systolic function.  2.  Impaired relaxation pattern of LV diastolic filling.  3. Moderate concentric left ventricular hypertrophy.  4. Mildly dilated left atrium.  5. There is mild dilatation of the aorta root. Past Medical History:  Diagnosis Date  . Atrial fibrillation (HCC)   . Coronary artery disease   . Dysrhythmia    a fib  . Hypertension   . Myocardial infarction (HCC)    unknown  . Sleep apnea     Past Surgical History:  Procedure Laterality Date  . CARDIAC SURGERY    . CERVICAL FUSION    . CORONARY ARTERY BYPASS GRAFT  2013   1  . ESOPHAGOGASTRODUODENOSCOPY (EGD) WITH PROPOFOL N/A 12/21/2016   Procedure: ESOPHAGOGASTRODUODENOSCOPY (EGD) WITH PROPOFOL;  Surgeon: 12/23/2016, MD;  Location: [facility];  Service: Gastroenterology;  Laterality: N/A;  . TONSILLECTOMY      MEDICATIONS: . acetaminophen (TYLENOL) 500 MG tablet  . Ascorbic Acid (VITAMIN C PO)  . aspirin EC 81 MG tablet  . carvedilol (COREG) 25 MG tablet  . diltiazem (CARDIZEM CD) 120 MG 24 hr capsule  . HYDROcodone-acetaminophen (NORCO/VICODIN) 5-325 MG tablet  . lisinopril (PRINIVIL,ZESTRIL) 40 MG tablet  . Melatonin 3 MG CAPS  . pantoprazole (PROTONIX) 40 MG tablet  . PRESCRIPTION MEDICATION  . rivaroxaban (XARELTO) 20 MG TABS tablet  .  rosuvastatin (CRESTOR) 40 MG tablet  . senna (SENOKOT) 8.6 MG TABS tablet   No current facility-administered medications for this encounter.    Maia Plan [facility] Pre-Surgical Testing [phone] 02/21/19  4:08 PM

## 2019-02-21 NOTE — Progress Notes (Signed)
PCP - Dr. Anselmo Pickler Cardiologist - Dr. Gardiner Rhyme  Chest x-ray - 11/28/18 EKG - 12/05/18 Stress Test -  ECHO - 11/29/18 Cardiac Cath - no CABG x1  Sleep Study - Yes CPAP - Yes  Fasting Blood Sugar - NA Checks Blood Sugar _____ times a day  Blood Thinner Instructions:Xarelto stop 5 days before,    ASA Stop 2 day before surgery Aspirin Instructions:instructions given by Dr Sid Falcon office Last Dose: of both medications was Saturday 9/19 as reported by Pt.  Anesthesia review:   Patient denies shortness of breath, fever, cough and chest pain at PAT appointment yes  Patient verbalized understanding of instructions that were given to them at the PAT appointment. Patient was also instructed that they will need to review over the PAT instructions again at home before surgery. yes

## 2019-02-22 ENCOUNTER — Inpatient Hospital Stay (HOSPITAL_COMMUNITY): Payer: No Typology Code available for payment source

## 2019-02-22 ENCOUNTER — Encounter (HOSPITAL_COMMUNITY)
Admission: RE | Disposition: A | Discharge status: Home / Self Care | Payer: Self-pay | Source: Home / Self Care | Attending: Orthopedic Surgery

## 2019-02-22 ENCOUNTER — Encounter (HOSPITAL_COMMUNITY): Payer: Self-pay | Admitting: *Deleted

## 2019-02-22 ENCOUNTER — Inpatient Hospital Stay (HOSPITAL_COMMUNITY): Payer: No Typology Code available for payment source | Admitting: Physician Assistant

## 2019-02-22 ENCOUNTER — Inpatient Hospital Stay (HOSPITAL_COMMUNITY)
Admission: RE | Admit: 2019-02-22 | Discharge: 2019-02-23 | DRG: 470 | Disposition: A | Discharge status: Home / Self Care | Payer: No Typology Code available for payment source | Attending: Orthopedic Surgery | Admitting: Orthopedic Surgery

## 2019-02-22 DIAGNOSIS — F17210 Nicotine dependence, cigarettes, uncomplicated: Secondary | ICD-10-CM | POA: Diagnosis present

## 2019-02-22 DIAGNOSIS — I4891 Unspecified atrial fibrillation: Secondary | ICD-10-CM | POA: Diagnosis present

## 2019-02-22 DIAGNOSIS — M1612 Unilateral primary osteoarthritis, left hip: Secondary | ICD-10-CM | POA: Diagnosis present

## 2019-02-22 DIAGNOSIS — Z20828 Contact with and (suspected) exposure to other viral communicable diseases: Secondary | ICD-10-CM | POA: Diagnosis present

## 2019-02-22 DIAGNOSIS — Z79899 Other long term (current) drug therapy: Secondary | ICD-10-CM | POA: Diagnosis not present

## 2019-02-22 DIAGNOSIS — M87052 Idiopathic aseptic necrosis of left femur: Secondary | ICD-10-CM | POA: Diagnosis present

## 2019-02-22 DIAGNOSIS — Z09 Encounter for follow-up examination after completed treatment for conditions other than malignant neoplasm: Secondary | ICD-10-CM

## 2019-02-22 DIAGNOSIS — Z23 Encounter for immunization: Secondary | ICD-10-CM

## 2019-02-22 DIAGNOSIS — Z419 Encounter for procedure for purposes other than remedying health state, unspecified: Secondary | ICD-10-CM

## 2019-02-22 DIAGNOSIS — Z6834 Body mass index (BMI) 34.0-34.9, adult: Secondary | ICD-10-CM | POA: Diagnosis not present

## 2019-02-22 DIAGNOSIS — Z7901 Long term (current) use of anticoagulants: Secondary | ICD-10-CM

## 2019-02-22 DIAGNOSIS — I252 Old myocardial infarction: Secondary | ICD-10-CM

## 2019-02-22 DIAGNOSIS — E669 Obesity, unspecified: Secondary | ICD-10-CM | POA: Diagnosis present

## 2019-02-22 DIAGNOSIS — I10 Essential (primary) hypertension: Secondary | ICD-10-CM | POA: Diagnosis present

## 2019-02-22 DIAGNOSIS — M879 Osteonecrosis, unspecified: Principal | ICD-10-CM | POA: Diagnosis present

## 2019-02-22 DIAGNOSIS — G4730 Sleep apnea, unspecified: Secondary | ICD-10-CM | POA: Diagnosis present

## 2019-02-22 DIAGNOSIS — I2510 Atherosclerotic heart disease of native coronary artery without angina pectoris: Secondary | ICD-10-CM | POA: Diagnosis present

## 2019-02-22 HISTORY — PX: TOTAL HIP ARTHROPLASTY: SHX124

## 2019-02-22 LAB — TYPE AND SCREEN
ABO/RH(D): O POS
Antibody Screen: NEGATIVE

## 2019-02-22 SURGERY — ARTHROPLASTY, HIP, TOTAL, ANTERIOR APPROACH
Anesthesia: Regional | Site: Hip | Laterality: Left

## 2019-02-22 MED ORDER — METHOCARBAMOL 500 MG IVPB - SIMPLE MED
500.0000 mg | Freq: Four times a day (QID) | INTRAVENOUS | Status: DC | PRN
  Filled 2019-02-22: qty 50

## 2019-02-22 MED ORDER — FENTANYL CITRATE (PF) 100 MCG/2ML IJ SOLN
INTRAMUSCULAR | Status: DC | PRN
  Administered 2019-02-22: 25 ug via INTRAVENOUS

## 2019-02-22 MED ORDER — PROPOFOL 10 MG/ML IV BOLUS
INTRAVENOUS | Status: AC
  Filled 2019-02-22: qty 20

## 2019-02-22 MED ORDER — CHLORHEXIDINE GLUCONATE 4 % EX LIQD: 60.0000 mL | Freq: Once | CUTANEOUS | Status: DC

## 2019-02-22 MED ORDER — BUPIVACAINE IN DEXTROSE 0.75-8.25 % IT SOLN
INTRATHECAL | Status: DC | PRN
  Administered 2019-02-22: 2 mL via INTRATHECAL

## 2019-02-22 MED ORDER — CELECOXIB 200 MG PO CAPS
200.0000 mg | ORAL_CAPSULE | Freq: Two times a day (BID) | ORAL | Status: DC
  Administered 2019-02-22 – 2019-02-23 (×2): 200 mg via ORAL
  Filled 2019-02-22 (×2): qty 1

## 2019-02-22 MED ORDER — BUPIVACAINE-EPINEPHRINE (PF) 0.5% -1:200000 IJ SOLN
INTRAMUSCULAR | Status: DC | PRN
  Administered 2019-02-22: 30 mL via PERINEURAL

## 2019-02-22 MED ORDER — PHENOL 1.4 % MT LIQD: 1.0000 | OROMUCOSAL | Status: DC | PRN

## 2019-02-22 MED ORDER — PROPOFOL 500 MG/50ML IV EMUL
INTRAVENOUS | Status: DC | PRN
  Administered 2019-02-22: 50 ug/kg/min via INTRAVENOUS

## 2019-02-22 MED ORDER — DEXAMETHASONE SODIUM PHOSPHATE 10 MG/ML IJ SOLN
10.0000 mg | Freq: Once | INTRAMUSCULAR | Status: AC
  Administered 2019-02-23: 09:00:00 10 mg via INTRAVENOUS
  Filled 2019-02-22: qty 1

## 2019-02-22 MED ORDER — SODIUM CHLORIDE 0.9 % IV SOLN: INTRAVENOUS | Status: DC

## 2019-02-22 MED ORDER — POVIDONE-IODINE 10 % EX SWAB: 2.0000 "application " | Freq: Once | CUTANEOUS | Status: DC

## 2019-02-22 MED ORDER — KETOROLAC TROMETHAMINE 30 MG/ML IJ SOLN
INTRAMUSCULAR | Status: AC
  Filled 2019-02-22: qty 1

## 2019-02-22 MED ORDER — HYDROMORPHONE HCL 1 MG/ML IJ SOLN: 0.2500 mg | INTRAMUSCULAR | Status: DC | PRN

## 2019-02-22 MED ORDER — LACTATED RINGERS IV SOLN
INTRAVENOUS | Status: DC
  Administered 2019-02-22: 09:00:00 via INTRAVENOUS

## 2019-02-22 MED ORDER — MEPERIDINE HCL 50 MG/ML IJ SOLN: 6.2500 mg | INTRAMUSCULAR | Status: DC | PRN

## 2019-02-22 MED ORDER — ONDANSETRON HCL 4 MG/2ML IJ SOLN: 4.0000 mg | Freq: Once | INTRAMUSCULAR | Status: DC | PRN

## 2019-02-22 MED ORDER — SODIUM CHLORIDE 0.9 % IR SOLN
Status: DC | PRN
  Administered 2019-02-22: 1000 mL

## 2019-02-22 MED ORDER — KETOROLAC TROMETHAMINE 30 MG/ML IJ SOLN
INTRAMUSCULAR | Status: DC | PRN
  Administered 2019-02-22: 30 mg via INTRAVENOUS

## 2019-02-22 MED ORDER — HYDROCODONE-ACETAMINOPHEN 5-325 MG PO TABS
1.0000 | ORAL_TABLET | ORAL | Status: DC | PRN
  Administered 2019-02-22 – 2019-02-23 (×2): 2 via ORAL
  Filled 2019-02-22 (×2): qty 2

## 2019-02-22 MED ORDER — ROSUVASTATIN CALCIUM 20 MG PO TABS
40.0000 mg | ORAL_TABLET | Freq: Every day | ORAL | Status: DC
  Administered 2019-02-22: 40 mg via ORAL
  Filled 2019-02-22: qty 2

## 2019-02-22 MED ORDER — MENTHOL 3 MG MT LOZG: 1.0000 | LOZENGE | OROMUCOSAL | Status: DC | PRN

## 2019-02-22 MED ORDER — ACETAMINOPHEN 10 MG/ML IV SOLN: 1000.0000 mg | Freq: Once | INTRAVENOUS | Status: DC | PRN

## 2019-02-22 MED ORDER — ONDANSETRON HCL 4 MG/2ML IJ SOLN
INTRAMUSCULAR | Status: DC | PRN
  Administered 2019-02-22: 4 mg via INTRAVENOUS

## 2019-02-22 MED ORDER — OXYCODONE HCL 5 MG/5ML PO SOLN: 5.0000 mg | Freq: Once | ORAL | Status: DC | PRN

## 2019-02-22 MED ORDER — ACETAMINOPHEN 325 MG PO TABS
325.0000 mg | ORAL_TABLET | Freq: Four times a day (QID) | ORAL | Status: DC | PRN
  Administered 2019-02-23: 06:00:00 650 mg via ORAL
  Filled 2019-02-22: qty 2

## 2019-02-22 MED ORDER — SODIUM CHLORIDE (PF) 0.9 % IJ SOLN
INTRAMUSCULAR | Status: DC | PRN
  Administered 2019-02-22: 30 mL

## 2019-02-22 MED ORDER — METHOCARBAMOL 500 MG PO TABS
500.0000 mg | ORAL_TABLET | Freq: Four times a day (QID) | ORAL | Status: DC | PRN
  Administered 2019-02-22 – 2019-02-23 (×2): 500 mg via ORAL
  Filled 2019-02-22 (×2): qty 1

## 2019-02-22 MED ORDER — EPHEDRINE SULFATE-NACL 50-0.9 MG/10ML-% IV SOSY
PREFILLED_SYRINGE | INTRAVENOUS | Status: DC | PRN
  Administered 2019-02-22: 5 mg via INTRAVENOUS
  Administered 2019-02-22: 10 mg via INTRAVENOUS
  Administered 2019-02-22: 5 mg via INTRAVENOUS
  Administered 2019-02-22 (×2): 10 mg via INTRAVENOUS
  Administered 2019-02-22: 5 mg via INTRAVENOUS

## 2019-02-22 MED ORDER — RIVAROXABAN 10 MG PO TABS
10.0000 mg | ORAL_TABLET | Freq: Every day | ORAL | Status: DC
  Administered 2019-02-23: 10 mg via ORAL
  Filled 2019-02-22: qty 1

## 2019-02-22 MED ORDER — DEXAMETHASONE SODIUM PHOSPHATE 10 MG/ML IJ SOLN
INTRAMUSCULAR | Status: DC | PRN
  Administered 2019-02-22: 8 mg via INTRAVENOUS

## 2019-02-22 MED ORDER — METOCLOPRAMIDE HCL 5 MG PO TABS: 5.0000 mg | ORAL_TABLET | Freq: Three times a day (TID) | ORAL | Status: DC | PRN

## 2019-02-22 MED ORDER — ACETAMINOPHEN 10 MG/ML IV SOLN
INTRAVENOUS | Status: AC
  Filled 2019-02-22: qty 100

## 2019-02-22 MED ORDER — CEFAZOLIN SODIUM-DEXTROSE 2-4 GM/100ML-% IV SOLN
2.0000 g | Freq: Four times a day (QID) | INTRAVENOUS | Status: AC
  Administered 2019-02-22 (×2): 2 g via INTRAVENOUS
  Filled 2019-02-22 (×2): qty 100

## 2019-02-22 MED ORDER — METOCLOPRAMIDE HCL 5 MG/ML IJ SOLN: 5.0000 mg | Freq: Three times a day (TID) | INTRAMUSCULAR | Status: DC | PRN

## 2019-02-22 MED ORDER — BUPIVACAINE-EPINEPHRINE 0.5% -1:200000 IJ SOLN
INTRAMUSCULAR | Status: AC
  Filled 2019-02-22: qty 1

## 2019-02-22 MED ORDER — CEFAZOLIN SODIUM-DEXTROSE 2-4 GM/100ML-% IV SOLN
2.0000 g | INTRAVENOUS | Status: AC
  Administered 2019-02-22: 2 g via INTRAVENOUS
  Filled 2019-02-22: qty 100

## 2019-02-22 MED ORDER — CARVEDILOL 25 MG PO TABS
25.0000 mg | ORAL_TABLET | Freq: Two times a day (BID) | ORAL | Status: DC
  Administered 2019-02-22 – 2019-02-23 (×2): 25 mg via ORAL
  Filled 2019-02-22 (×2): qty 1

## 2019-02-22 MED ORDER — PHENYLEPHRINE 40 MCG/ML (10ML) SYRINGE FOR IV PUSH (FOR BLOOD PRESSURE SUPPORT)
PREFILLED_SYRINGE | INTRAVENOUS | Status: DC | PRN
  Administered 2019-02-22 (×5): 80 ug via INTRAVENOUS

## 2019-02-22 MED ORDER — ACETAMINOPHEN 10 MG/ML IV SOLN
1000.0000 mg | INTRAVENOUS | Status: AC
  Administered 2019-02-22: 1000 mg via INTRAVENOUS

## 2019-02-22 MED ORDER — CEFAZOLIN SODIUM-DEXTROSE 1-4 GM/50ML-% IV SOLN
INTRAVENOUS | Status: DC | PRN
  Administered 2019-02-22: 1 g via INTRAVENOUS

## 2019-02-22 MED ORDER — PROPOFOL 10 MG/ML IV BOLUS
INTRAVENOUS | Status: AC
  Filled 2019-02-22: qty 40

## 2019-02-22 MED ORDER — WATER FOR IRRIGATION, STERILE IR SOLN
Status: DC | PRN
  Administered 2019-02-22 (×2): 1000 mL

## 2019-02-22 MED ORDER — SODIUM CHLORIDE 0.9 % IV SOLN
INTRAVENOUS | Status: DC
  Administered 2019-02-22 – 2019-02-23 (×2): via INTRAVENOUS

## 2019-02-22 MED ORDER — EPHEDRINE 5 MG/ML INJ
INTRAVENOUS | Status: AC
  Filled 2019-02-22: qty 10

## 2019-02-22 MED ORDER — ONDANSETRON HCL 4 MG/2ML IJ SOLN
INTRAMUSCULAR | Status: AC
  Filled 2019-02-22: qty 2

## 2019-02-22 MED ORDER — SENNA 8.6 MG PO TABS
1.0000 | ORAL_TABLET | Freq: Two times a day (BID) | ORAL | Status: DC
  Administered 2019-02-22 – 2019-02-23 (×2): 8.6 mg via ORAL
  Filled 2019-02-22 (×2): qty 1

## 2019-02-22 MED ORDER — CEFAZOLIN SODIUM-DEXTROSE 1-4 GM/50ML-% IV SOLN
1.0000 g | Freq: Once | INTRAVENOUS | Status: DC
  Filled 2019-02-22: qty 50

## 2019-02-22 MED ORDER — SODIUM CHLORIDE (PF) 0.9 % IJ SOLN
INTRAMUSCULAR | Status: AC
  Filled 2019-02-22: qty 50

## 2019-02-22 MED ORDER — DIPHENHYDRAMINE HCL 12.5 MG/5ML PO ELIX
12.5000 mg | ORAL_SOLUTION | ORAL | Status: DC | PRN
  Administered 2019-02-22: 25 mg via ORAL
  Filled 2019-02-22: qty 10

## 2019-02-22 MED ORDER — HYDROCODONE-ACETAMINOPHEN 7.5-325 MG PO TABS
1.0000 | ORAL_TABLET | ORAL | Status: DC | PRN
  Administered 2019-02-23: 2 via ORAL
  Filled 2019-02-22: qty 2

## 2019-02-22 MED ORDER — PHENYLEPHRINE 40 MCG/ML (10ML) SYRINGE FOR IV PUSH (FOR BLOOD PRESSURE SUPPORT)
PREFILLED_SYRINGE | INTRAVENOUS | Status: AC
  Filled 2019-02-22: qty 10

## 2019-02-22 MED ORDER — TRANEXAMIC ACID-NACL 1000-0.7 MG/100ML-% IV SOLN
INTRAVENOUS | Status: AC
  Filled 2019-02-22: qty 100

## 2019-02-22 MED ORDER — MORPHINE SULFATE (PF) 2 MG/ML IV SOLN: 0.5000 mg | INTRAVENOUS | Status: DC | PRN

## 2019-02-22 MED ORDER — DILTIAZEM HCL ER COATED BEADS 120 MG PO CP24
120.0000 mg | ORAL_CAPSULE | Freq: Every day | ORAL | Status: DC
  Administered 2019-02-23: 09:00:00 120 mg via ORAL
  Filled 2019-02-22: qty 1

## 2019-02-22 MED ORDER — POLYETHYLENE GLYCOL 3350 17 G PO PACK: 17.0000 g | PACK | Freq: Every day | ORAL | Status: DC | PRN

## 2019-02-22 MED ORDER — FENTANYL CITRATE (PF) 100 MCG/2ML IJ SOLN
INTRAMUSCULAR | Status: AC
  Filled 2019-02-22: qty 2

## 2019-02-22 MED ORDER — DEXAMETHASONE SODIUM PHOSPHATE 10 MG/ML IJ SOLN
INTRAMUSCULAR | Status: AC
  Filled 2019-02-22: qty 1

## 2019-02-22 MED ORDER — ONDANSETRON HCL 4 MG PO TABS: 4.0000 mg | ORAL_TABLET | Freq: Four times a day (QID) | ORAL | Status: DC | PRN

## 2019-02-22 MED ORDER — OXYCODONE HCL 5 MG PO TABS: 5.0000 mg | ORAL_TABLET | Freq: Once | ORAL | Status: DC | PRN

## 2019-02-22 MED ORDER — ISOPROPYL ALCOHOL 70 % SOLN
Status: DC | PRN
  Administered 2019-02-22: 1 via TOPICAL

## 2019-02-22 MED ORDER — PANTOPRAZOLE SODIUM 40 MG PO TBEC
40.0000 mg | DELAYED_RELEASE_TABLET | Freq: Two times a day (BID) | ORAL | Status: DC
  Administered 2019-02-22 – 2019-02-23 (×3): 40 mg via ORAL
  Filled 2019-02-22 (×3): qty 1

## 2019-02-22 MED ORDER — INFLUENZA VAC SPLIT QUAD 0.5 ML IM SUSY
0.5000 mL | PREFILLED_SYRINGE | INTRAMUSCULAR | Status: AC
  Administered 2019-02-23: 0.5 mL via INTRAMUSCULAR

## 2019-02-22 MED ORDER — ONDANSETRON HCL 4 MG/2ML IJ SOLN: 4.0000 mg | Freq: Four times a day (QID) | INTRAMUSCULAR | Status: DC | PRN

## 2019-02-22 MED ORDER — TRANEXAMIC ACID-NACL 1000-0.7 MG/100ML-% IV SOLN
1000.0000 mg | INTRAVENOUS | Status: AC
  Administered 2019-02-22: 1000 mg via INTRAVENOUS

## 2019-02-22 MED ORDER — SODIUM CHLORIDE 0.9 % IR SOLN
Status: DC | PRN
  Administered 2019-02-22: 1000 mL
  Administered 2019-02-22: 3000 mL

## 2019-02-22 MED ORDER — ALUM & MAG HYDROXIDE-SIMETH 200-200-20 MG/5ML PO SUSP: 30.0000 mL | ORAL | Status: DC | PRN

## 2019-02-22 MED ORDER — DOCUSATE SODIUM 100 MG PO CAPS
100.0000 mg | ORAL_CAPSULE | Freq: Two times a day (BID) | ORAL | Status: DC
  Administered 2019-02-23: 100 mg via ORAL
  Filled 2019-02-22 (×2): qty 1

## 2019-02-22 SURGICAL SUPPLY — 58 items
BAG DECANTER FOR FLEXI CONT (MISCELLANEOUS) IMPLANT
BAG ZIPLOCK 12X15 (MISCELLANEOUS) IMPLANT
BLADE SURG SZ10 CARB STEEL (BLADE) IMPLANT
CHLORAPREP W/TINT 26 (MISCELLANEOUS) ×2 IMPLANT
COVER PERINEAL POST (MISCELLANEOUS) ×2 IMPLANT
COVER SURGICAL LIGHT HANDLE (MISCELLANEOUS) ×2 IMPLANT
COVER WAND RF STERILE (DRAPES) IMPLANT
CUP ACET PNNCL SECTR W/GRIP 56 (Hips) ×1 IMPLANT
DECANTER SPIKE VIAL GLASS SM (MISCELLANEOUS) ×2 IMPLANT
DERMABOND ADVANCED (GAUZE/BANDAGES/DRESSINGS) ×1
DERMABOND ADVANCED .7 DNX12 (GAUZE/BANDAGES/DRESSINGS) ×1 IMPLANT
DRAPE IMP U-DRAPE 54X76 (DRAPES) ×2 IMPLANT
DRAPE SHEET LG 3/4 BI-LAMINATE (DRAPES) ×6 IMPLANT
DRAPE STERI IOBAN 125X83 (DRAPES) IMPLANT
DRAPE U-SHAPE 47X51 STRL (DRAPES) ×4 IMPLANT
DRESSING AQUACEL AG SP 3.5X4 (GAUZE/BANDAGES/DRESSINGS) ×1 IMPLANT
DRSG AQUACEL AG ADV 3.5X10 (GAUZE/BANDAGES/DRESSINGS) ×2 IMPLANT
DRSG AQUACEL AG SP 3.5X4 (GAUZE/BANDAGES/DRESSINGS) ×2
ELECT PENCIL ROCKER SW 15FT (MISCELLANEOUS) ×2 IMPLANT
ELECT REM PT RETURN 15FT ADLT (MISCELLANEOUS) ×2 IMPLANT
GAUZE SPONGE 4X4 12PLY STRL (GAUZE/BANDAGES/DRESSINGS) ×2 IMPLANT
GLOVE BIO SURGEON STRL SZ8.5 (GLOVE) ×4 IMPLANT
GLOVE BIOGEL PI IND STRL 8.5 (GLOVE) ×1 IMPLANT
GLOVE BIOGEL PI INDICATOR 8.5 (GLOVE) ×1
GOWN SPEC L3 XXLG W/TWL (GOWN DISPOSABLE) ×2 IMPLANT
HANDPIECE INTERPULSE COAX TIP (DISPOSABLE) ×1
HEAD CERAMIC DELTA 36 PLUS 1.5 (Hips) ×2 IMPLANT
HOLDER FOLEY CATH W/STRAP (MISCELLANEOUS) ×2 IMPLANT
HOOD PEEL AWAY FLYTE STAYCOOL (MISCELLANEOUS) ×8 IMPLANT
JET LAVAGE IRRISEPT WOUND (IRRIGATION / IRRIGATOR) ×2
KIT TURNOVER KIT A (KITS) IMPLANT
LAVAGE JET IRRISEPT WOUND (IRRIGATION / IRRIGATOR) ×1 IMPLANT
LINER NEUTRAL 52MMX36MMX56N (Liner) ×2 IMPLANT
MANIFOLD NEPTUNE II (INSTRUMENTS) ×2 IMPLANT
MARKER SKIN DUAL TIP RULER LAB (MISCELLANEOUS) ×2 IMPLANT
NDL SAFETY ECLIPSE 18X1.5 (NEEDLE) ×1 IMPLANT
NEEDLE HYPO 18GX1.5 SHARP (NEEDLE) ×1
NEEDLE SPNL 18GX3.5 QUINCKE PK (NEEDLE) ×2 IMPLANT
PACK ANTERIOR HIP CUSTOM (KITS) ×2 IMPLANT
PINN SECTOR W/GRIP ACE CUP 56 (Hips) ×2 IMPLANT
SAW OSC TIP CART 19.5X105X1.3 (SAW) ×2 IMPLANT
SEALER BIPOLAR AQUA 6.0 (INSTRUMENTS) ×2 IMPLANT
SET HNDPC FAN SPRY TIP SCT (DISPOSABLE) ×1 IMPLANT
STEM TRI LOC BPS GRIP SZ9 OFFS ×1 IMPLANT
SUT ETHIBOND NAB CT1 #1 30IN (SUTURE) ×4 IMPLANT
SUT MNCRL AB 3-0 PS2 18 (SUTURE) ×2 IMPLANT
SUT MNCRL AB 4-0 PS2 18 (SUTURE) ×2 IMPLANT
SUT MON AB 2-0 CT1 36 (SUTURE) ×4 IMPLANT
SUT STRATAFIX PDO 1 14 VIOLET (SUTURE) ×1
SUT STRATFX PDO 1 14 VIOLET (SUTURE) ×1
SUT VIC AB 2-0 CT1 27 (SUTURE) ×2
SUT VIC AB 2-0 CT1 TAPERPNT 27 (SUTURE) ×2 IMPLANT
SUTURE STRATFX PDO 1 14 VIOLET (SUTURE) ×1 IMPLANT
SYR 3ML LL SCALE MARK (SYRINGE) ×2 IMPLANT
TRAY FOLEY MTR SLVR 16FR STAT (SET/KITS/TRAYS/PACK) IMPLANT
TRI LOC BPS W/GRIP SZ 9 OFFS ×2 IMPLANT
WATER STERILE IRR 1000ML POUR (IV SOLUTION) ×2 IMPLANT
YANKAUER SUCT BULB TIP 10FT TU (MISCELLANEOUS) ×2 IMPLANT

## 2019-02-22 NOTE — Anesthesia Procedure Notes (Signed)
Spinal  Start time: 02/22/2019 11:07 AM End time: 02/22/2019 11:10 AM Staffing Resident/CRNA: Victoriano Lain, CRNA Performed: resident/CRNA  Preanesthetic Checklist Completed: patient identified, site marked, surgical consent, pre-op evaluation, timeout performed, IV checked, risks and benefits discussed and monitors and equipment checked Spinal Block Patient position: sitting Prep: site prepped and draped and DuraPrep Patient monitoring: heart rate, continuous pulse ox and blood pressure Approach: midline Location: L3-4 Injection technique: single-shot Needle Needle type: Pencan  Needle gauge: 24 G Needle length: 10 cm Assessment Sensory level: T4 Additional Notes Pt placed in sitting position. Spinal kit expiration date checked and verified. One attempt, + CSF, - heme. Pt tolerated well. Pt placed supine after procedure.

## 2019-02-22 NOTE — Evaluation (Signed)
Physical Therapy Evaluation Patient Details Name: Cameron Schultz MRN: [id number] DOB: 1955-09-02 Today's Date: 02/22/2019   History of Present Illness  Pt is 63 y.o. male s/p Lt THA ant approach on 02/22/19 with PMH significant for CAD, HTN, MI, A-fib, and CABG.  Clinical Impression  Honorio Devol is a 63 y.o. male POD 0 s/p Lt THA ant approach. Patient reports independence with mobility at baseline with occasional use of rollator to relieve low back and hip pain. Patient is now limited by functional impairments (see PT problem list below) and requires min assist/guard for transfers and gait with RW. Patient was able to ambulate ~100 feet with RW and minimal cues for safe handling. Patient instructed in exercise to facilitate ROM and circulation to manage edema. Patient will benefit from continued skilled PT interventions to address impairments and progress towards PLOF. Acute PT will follow to progress mobility and stair training in preparation for safe discharge home.     Follow Up Recommendations Follow surgeon's recommendation for DC plan and follow-up therapies    Equipment Recommendations  Rolling walker with 5" wheels;3in1 (PT)(pt instructed on how to obtain 3-in1- or toilet riser)    Recommendations for Other Services       Precautions / Restrictions Precautions Precautions: Fall Restrictions Weight Bearing Restrictions: No      Mobility  Bed Mobility Overal bed mobility: Needs Assistance Bed Mobility: Supine to Sit     Supine to sit: Supervision;HOB elevated     General bed mobility comments: pt using bed rails and HOB elevated to transfer to sit EOB, no ceus or assist required  Transfers Overall transfer level: Needs assistance Equipment used: Rolling walker (2 wheeled) Transfers: Sit to/from Stand Sit to Stand: From elevated surface;Min assist         General transfer comment: cues for safe hand placement with RW and min assist to initiate stand from elevated  surface  Ambulation/Gait Ambulation/Gait assistance: Min guard Gait Distance (Feet): 100 Feet Assistive device: Rolling walker (2 wheeled) Gait Pattern/deviations: Step-through pattern;Decreased step length - right;Decreased step length - left;Decreased stance time - left;Decreased stride length;Decreased weight shift to left Gait velocity: decreased   General Gait Details: pt required cues for safe hand placement and to keep RW close, no overt LOB noted druing gait, pt with good safety awareness during turns  Science writer    Modified Rankin (Stroke Patients Only)       Balance Overall balance assessment: Needs assistance Sitting-balance support: Feet supported;No upper extremity supported Sitting balance-Leahy Scale: Good     Standing balance support: During functional activity;Bilateral upper extremity supported Standing balance-Leahy Scale: Fair Standing balance comment: pt able to maintain standing static balance but requires support for gait            Pertinent Vitals/Pain Pain Assessment: No/denies pain    Home Living Family/patient expects to be discharged to:: Private residence Living Arrangements: Spouse/significant other;Children Available Help at Discharge: Family Type of Home: House Home Access: Stairs to [employer] entry   Technical brewer of Steps: 1 curb to step up from parking lot, level entry at door Home Layout: One level Home Equipment: Environmental consultant - 4 wheels      Prior Function Level of Independence: Independent         Comments: pt reports occasional use of rollator due to hip and back pain but typically he ambulate with no device     Hand Dominance  Extremity/Trunk Assessment   Upper Extremity Assessment Upper Extremity Assessment: Overall WFL for tasks assessed    Lower Extremity Assessment Lower Extremity Assessment: LLE deficits/detail LLE Deficits / Details: pt with good quad  activation and heel slide with no pain, sensation intact for Lt LE    Cervical / Trunk Assessment Cervical / Trunk Assessment: Normal  Communication   Communication: No difficulties  Cognition Arousal/Alertness: Awake/alert Behavior During Therapy: WFL for tasks assessed/performed Overall Cognitive Status: Within Functional Limits for tasks assessed             General Comments      Exercises Total Joint Exercises Ankle Circles/Pumps: AROM;Both;Seated;10 reps Quad Sets: AROM;5 reps;Supine;Left Heel Slides: AROM;Supine;5 reps;Left   Assessment/Plan    PT Assessment Patient needs continued PT services  PT Problem List Decreased strength;Decreased balance;Decreased range of motion;Decreased mobility;Decreased activity tolerance       PT Treatment Interventions DME instruction;Functional mobility training;Balance training;Patient/family education;Gait training;Therapeutic activities;Modalities;Therapeutic exercise;Stair training    PT Goals (Current goals can be found in the Care Plan section)  Acute Rehab PT Goals Patient Stated Goal: pt wants to progress back to walking with no device PT Goal Formulation: With patient Time For Goal Achievement: 03/01/19 Potential to Achieve Goals: Good    Frequency 7X/week    AM-PAC PT "6 Clicks" Mobility  Outcome Measure Help needed turning from your back to your side while in a flat bed without using bedrails?: A Little Help needed moving from lying on your back to sitting on the side of a flat bed without using bedrails?: A Little Help needed moving to and from a bed to a chair (including a wheelchair)?: A Little Help needed standing up from a chair using your arms (e.g., wheelchair or bedside chair)?: A Little Help needed to walk in hospital room?: A Little Help needed climbing 3-5 steps with a railing? : A Little 6 Click Score: 18    End of Session Equipment Utilized During Treatment: Gait belt Activity Tolerance: Patient  tolerated treatment well Patient left: in chair;with call bell/phone within reach;with chair alarm set;with family/visitor present Nurse Communication: Mobility status PT Visit Diagnosis: Other abnormalities of gait and mobility (R26.89);Difficulty in walking, not elsewhere classified (R26.2);Muscle weakness (generalized) (M62.81)    Time: 1540-0867 PT Time Calculation (min) (ACUTE ONLY): 19 min   Charges:   PT Evaluation $PT Eval Low Complexity: 1 Low         Valentino Saxon, PT, DPT, [facility] Physical Therapist with [facility]  02/22/2019 6:08 PM

## 2019-02-22 NOTE — Anesthesia Postprocedure Evaluation (Signed)
Anesthesia Post Note  Patient: Cameron Schultz  Procedure(s) Performed: TOTAL HIP ARTHROPLASTY ANTERIOR APPROACH (Left Hip)     Patient location during evaluation: Nursing Unit Anesthesia Type: Regional Level of consciousness: oriented and awake and alert Pain management: pain level controlled Vital Signs Assessment: post-procedure vital signs reviewed and stable Respiratory status: spontaneous breathing and respiratory function stable Cardiovascular status: blood pressure returned to baseline and stable Postop Assessment: no headache, no backache, no apparent nausea or vomiting and patient able to bend at knees Anesthetic complications: no    Last Vitals:  Vitals:   02/22/19 1415 02/22/19 1430  BP: 113/66   Pulse: (!) 49 (!) 50  Resp: 19 16  Temp:    SpO2: 97% 98%    Last Pain:  Vitals:   02/22/19 1415  TempSrc:   PainSc: 0-No pain                 Barnet Glasgow

## 2019-02-22 NOTE — Interval H&P Note (Signed)
History and Physical Interval Note:  02/22/2019 9:37 AM  Cameron Schultz  has presented today for surgery, with the diagnosis of avascular necrosis of bone of hip.  The various methods of treatment have been discussed with the patient and family. After consideration of risks, benefits and other options for treatment, the patient has consented to  Procedure(s): TOTAL HIP ARTHROPLASTY ANTERIOR APPROACH (Left) as a surgical intervention.  The patient's history has been reviewed, patient examined, no change in status, stable for surgery.  I have reviewed the patient's chart and labs.  Questions were answered to the patient's satisfaction.     Hilton Cork Misa Fedorko

## 2019-02-22 NOTE — Transfer of Care (Signed)
Immediate Anesthesia Transfer of Care Note  Patient: Cameron Schultz  Procedure(s) Performed: TOTAL HIP ARTHROPLASTY ANTERIOR APPROACH (Left Hip)  Patient Location: PACU  Anesthesia Type:MAC and Spinal  Level of Consciousness: awake, alert , oriented and patient cooperative  Airway & Oxygen Therapy: Patient Spontanous Breathing and Patient connected to face mask oxygen  Post-op Assessment: Report given to RN and Post -op Vital signs reviewed and stable  Post vital signs: Reviewed and stable  Last Vitals:  Vitals Value Taken Time  BP 108/73 02/22/19 1348  Temp    Pulse 53 02/22/19 1352  Resp 24 02/22/19 1352  SpO2 100 % 02/22/19 1352  Vitals shown include unvalidated device data.  Last Pain:  Vitals:   02/22/19 0851  TempSrc:   PainSc: 5       Patients Stated Pain Goal: 5 (14/97/02 6378)  Complications: No apparent anesthesia complications

## 2019-02-22 NOTE — Op Note (Signed)
OPERATIVE REPORT  SURGEON: Samson Frederic, MD   ASSISTANT: Skip Mayer, PA-C.  PREOPERATIVE DIAGNOSIS: Left hip avascular necrosis.   POSTOPERATIVE DIAGNOSIS: Left hip avascular necrosis.   PROCEDURE: Left total hip arthroplasty, anterior approach.   IMPLANTS: DePuy Tri Lock stem, size 9, hi offset. DePuy Pinnacle Cup, size 56 mm. DePuy Altrx liner, size 36 by 56 mm, neutral. DePuy Biolox ceramic head ball, size 36 + 1.5 mm.  ANESTHESIA:  Spinal  ESTIMATED BLOOD LOSS:-450 mL    ANTIBIOTICS: 3 g Ancef.  DRAINS: None.  COMPLICATIONS: None.   CONDITION: PACU - hemodynamically stable.   BRIEF CLINICAL NOTE: Cameron Schultz is a 63 y.o. male with a long-standing history of Left hip arthritis. After failing conservative management, the patient was indicated for total hip arthroplasty. The risks, benefits, and alternatives to the procedure were explained, and the patient elected to proceed.  PROCEDURE IN DETAIL: Surgical site was marked by myself in the pre-op holding area. Once inside the operating room, spinal anesthesia was obtained, and a foley catheter was inserted. The patient was then positioned on the Hana table.  All bony prominences were well padded.  The hip was prepped and draped in the normal sterile surgical fashion.  A time-out was called verifying side and site of surgery. The patient received IV antibiotics within 60 minutes of beginning the procedure.   The direct anterior approach to the hip was performed through the Hueter interval.  Lateral femoral circumflex vessels were treated with the Auqumantys. The anterior capsule was exposed and an inverted T capsulotomy was made. The femoral neck cut was made to the level of the templated cut.  A corkscrew was placed into the head and the head was removed. The head was passed to the back table and was measured.   Acetabular exposure was achieved, and the pulvinar and labrum were excised. Sequential reaming of the  acetabulum was then performed up to a size 55 mm reamer. A 56 mm cup was then opened and impacted into place at approximately 40 degrees of abduction and 20 degrees of anteversion. The final polyethylene liner was impacted into place and acetabular osteophytes were removed.    I then gained femoral exposure taking care to protect the abductors and greater trochanter.  This was performed using standard external rotation, extension, and adduction.  The capsule was peeled off the inner aspect of the greater trochanter, taking care to preserve the short external rotators. A cookie cutter was used to enter the femoral canal, and then the femoral canal finder was placed.  Sequential broaching was performed up to a size 9.  Calcar planer was used on the femoral neck remnant.  I placed a hi offset neck and a trial head ball.  The hip was reduced.  Leg lengths and offset were checked fluoroscopically.  The hip was dislocated and trial components were removed.  The final implants were placed, and the hip was reduced.  Fluoroscopy was used to confirm component position and leg lengths.  At 90 degrees of external rotation and full extension, the hip was stable to an anterior directed force.   The wound was copiously irrigated with Irrisept solution and normal saline using pule lavage.  Marcaine solution was injected into the periarticular soft tissue.  The wound was closed in layers using #1 Stratafix for the fascia, 2-0 Vicryl for the subcutaneous fat, 2-0 Monocryl for the deep dermal layer, 3-0 running Monocryl subcuticular stitch, and Dermabond for the skin.  Once the glue was  fully dried, an Aquacell Ag dressing was applied.  The patient was transported to the recovery room in stable condition.  Sponge, needle, and instrument counts were correct at the end of the case x2.  The patient tolerated the procedure well and there were no known complications.  Please note that a surgical assistant was a medical necessity for  this procedure to perform it in a safe and expeditious manner. Assistant was necessary to provide appropriate retraction of vital neurovascular structures, to prevent femoral fracture, and to allow for anatomic placement of the prosthesis.

## 2019-02-22 NOTE — Plan of Care (Signed)

## 2019-02-22 NOTE — Discharge Instructions (Signed)
°Dr. Naphtali Zywicki °Joint Replacement Specialist °[facility] °[address] °[place], [place] [address] °[phone] ° ° °TOTAL HIP REPLACEMENT POSTOPERATIVE DIRECTIONS ° ° ° °Hip Rehabilitation, Guidelines Following Surgery  ° °WEIGHT BEARING °Weight bearing as tolerated with assist device (walker, cane, etc) as directed, use it as long as suggested by your surgeon or therapist, typically at least 4-6 weeks. ° °The results of a hip operation are greatly improved after range of motion and muscle strengthening exercises. Follow all safety measures which are given to protect your hip. If any of these exercises cause increased pain or swelling in your joint, decrease the amount until you are comfortable again. Then slowly increase the exercises. Call your caregiver if you have problems or questions.  ° °HOME CARE INSTRUCTIONS  °Most of the following instructions are designed to prevent the dislocation of your new hip.  °Remove items at home which could result in a fall. This includes throw rugs or furniture in walking pathways.  °Continue medications as instructed at time of discharge. °· You may have some home medications which will be placed on hold until you complete the course of blood thinner medication. °· You may start showering once you are discharged home. Do not remove your dressing. °Do not put on socks or shoes without following the instructions of your caregivers.   °Sit on chairs with arms. Use the chair arms to help push yourself up when arising.  °Arrange for the use of a toilet seat elevator so you are not sitting low.  °· Walk with walker as instructed.  °You may resume a sexual relationship in one month or when given the OK by your caregiver.  °Use walker as long as suggested by your caregivers.  °You may put full weight on your legs and walk as much as is comfortable. °Avoid periods of inactivity such as sitting longer than an hour when not asleep. This helps prevent  blood clots.  °You may return to work once you are cleared by your surgeon.  °Do not drive a car for 6 weeks or until released by your surgeon.  °Do not drive while taking narcotics.  °Wear elastic stockings for two weeks following surgery during the day but you may remove then at night.  °Make sure you keep all of your appointments after your operation with all of your doctors and caregivers. You should call the office at the above phone number and make an appointment for approximately two weeks after the date of your surgery. °Please pick up a stool softener and laxative for home use as long as you are requiring pain medications. °· ICE to the affected hip every three hours for 30 minutes at a time and then as needed for pain and swelling. Continue to use ice on the hip for pain and swelling from surgery. You may notice swelling that will progress down to the foot and ankle.  This is normal after surgery.  Elevate the leg when you are not up walking on it.   °It is important for you to complete the blood thinner medication as prescribed by your doctor. °· Continue to use the breathing machine which will help keep your temperature down.  It is common for your temperature to cycle up and down following surgery, especially at night when you are not up moving around and exerting yourself.  The breathing machine keeps your lungs expanded and your temperature down. ° °RANGE OF MOTION AND STRENGTHENING EXERCISES  °These exercises are   designed to help you keep full movement of your hip joint. Follow your caregiver's or physical therapist's instructions. Perform all exercises about fifteen times, three times per day or as directed. Exercise both hips, even if you have had only one joint replacement. These exercises can be done on a training (exercise) mat, on the floor, on a table or on a bed. Use whatever works the best and is most comfortable for you. Use music or television while you are exercising so that the exercises  are a pleasant break in your day. This will make your life better with the exercises acting as a break in routine you can look forward to.  °Lying on your back, slowly slide your foot toward your buttocks, raising your knee up off the floor. Then slowly slide your foot back down until your leg is straight again.  °Lying on your back spread your legs as far apart as you can without causing discomfort.  °Lying on your side, raise your upper leg and foot straight up from the floor as far as is comfortable. Slowly lower the leg and repeat.  °Lying on your back, tighten up the muscle in the front of your thigh (quadriceps muscles). You can do this by keeping your leg straight and trying to raise your heel off the floor. This helps strengthen the largest muscle supporting your knee.  °Lying on your back, tighten up the muscles of your buttocks both with the legs straight and with the knee bent at a comfortable angle while keeping your heel on the floor.  ° °SKILLED REHAB INSTRUCTIONS: °If the patient is transferred to a skilled rehab facility following release from the hospital, a list of the current medications will be sent to the facility for the patient to continue.  When discharged from the skilled rehab facility, please have the facility set up the patient's Home Health Physical Therapy prior to being released. Also, the skilled facility will be responsible for providing the patient with their medications at time of release from the facility to include their pain medication and their blood thinner medication. If the patient is still at the rehab facility at time of the two week follow up appointment, the skilled rehab facility will also need to assist the patient in arranging follow up appointment in our office and any transportation needs. ° °MAKE SURE YOU:  °Understand these instructions.  °Will watch your condition.  °Will get help right away if you are not doing well or get worse. ° °Pick up stool softner and  laxative for home use following surgery while on pain medications. °Do not remove your dressing. °The dressing is waterproof--it is OK to take showers. °Continue to use ice for pain and swelling after surgery. °Do not use any lotions or creams on the incision until instructed by your surgeon. °Total Hip Protocol. ° ° °

## 2019-02-23 ENCOUNTER — Encounter (HOSPITAL_COMMUNITY): Payer: Self-pay | Admitting: Orthopedic Surgery

## 2019-02-23 DIAGNOSIS — M879 Osteonecrosis, unspecified: Secondary | ICD-10-CM | POA: Diagnosis not present

## 2019-02-23 LAB — BASIC METABOLIC PANEL
Anion gap: 8 (ref 5–15)
BUN: 13 mg/dL (ref 8–23)
CO2: 22 mmol/L (ref 22–32)
Calcium: 8.4 mg/dL — ABNORMAL LOW (ref 8.9–10.3)
Chloride: 106 mmol/L (ref 98–111)
Creatinine, Ser: 0.74 mg/dL (ref 0.61–1.24)
GFR calc Af Amer: >60 mL/min (ref >60)
GFR calc non Af Amer: >60 mL/min (ref >60)
Glucose, Bld: 156 mg/dL — ABNORMAL HIGH (ref 70–99)
Potassium: 4.1 mmol/L (ref 3.5–5.1)
Sodium: 136 mmol/L (ref 135–145)

## 2019-02-23 LAB — CBC
HCT: 33.7 % — ABNORMAL LOW (ref 39.0–52.0)
Hemoglobin: 10.7 g/dL — ABNORMAL LOW (ref 13.0–17.0)
MCH: 27 pg (ref 26.0–34.0)
MCHC: 31.8 g/dL (ref 30.0–36.0)
MCV: 84.9 fL (ref 80.0–100.0)
Platelets: 150 10*3/uL (ref 150–400)
RBC: 3.97 MIL/uL — ABNORMAL LOW (ref 4.22–5.81)
RDW: 15.5 % (ref 11.5–15.5)
WBC: 9.6 10*3/uL (ref 4.0–10.5)
nRBC: 0 % (ref 0.0–0.2)

## 2019-02-23 MED ORDER — ONDANSETRON HCL 4 MG PO TABS: 4.0000 mg | ORAL_TABLET | Freq: Four times a day (QID) | ORAL | 0 refills | Status: DC | PRN

## 2019-02-23 MED ORDER — SENNA 8.6 MG PO TABS: 1.0000 | ORAL_TABLET | Freq: Two times a day (BID) | ORAL | 0 refills | Status: DC

## 2019-02-23 MED ORDER — HYDROCODONE-ACETAMINOPHEN 7.5-325 MG PO TABS: 1.0000 | ORAL_TABLET | ORAL | 0 refills | Status: DC | PRN

## 2019-02-23 MED ORDER — DOCUSATE SODIUM 100 MG PO CAPS: 100.0000 mg | ORAL_CAPSULE | Freq: Two times a day (BID) | ORAL | 1 refills | Status: DC

## 2019-02-23 NOTE — Discharge Summary (Signed)
Physician Discharge Summary  Patient ID: Cameron Schultz MRN: [id number] DOB/AGE: 1955/09/08 63 y.o.  Admit date: 02/22/2019 Discharge date: 02/23/2019  Admission Diagnoses:  Avascular necrosis of bone of left hip [facility])  Discharge Diagnoses:  Principal Problem:   Avascular necrosis of bone of left hip ([place]) Active Problems:   Avascular necrosis of hip, left [facility])   Past Medical History:  Diagnosis Date  . Atrial fibrillation ([place])   . Coronary artery disease   . Dysrhythmia    a fib  . Hypertension   . Myocardial infarction ([place])    unknown  . Sleep apnea     Surgeries: Procedure(s): TOTAL HIP ARTHROPLASTY ANTERIOR APPROACH on 02/22/2019   Consultants (if any):   Discharged Condition: Improved  Hospital Course: Cameron Schultz is an 63 y.o. male who was admitted 02/22/2019 with a diagnosis of Avascular necrosis of bone of left hip ([place]) and went to the operating room on 02/22/2019 and underwent the above named procedures.    He was given perioperative antibiotics:  Anti-infectives (From admission, onward)   Start     Dose/Rate Route Frequency Ordered Stop   02/22/19 1700  ceFAZolin (ANCEF) IVPB 2g/100 mL premix     2 g 200 mL/hr over 30 Minutes Intravenous Every 6 hours 02/22/19 1521 02/22/19 2345   02/22/19 1130  ceFAZolin (ANCEF) IVPB 1 g/50 mL premix  Status:  Discontinued     1 g 100 mL/hr over 30 Minutes Intravenous  Once 02/22/19 1117 02/22/19 1329   02/22/19 0845  ceFAZolin (ANCEF) IVPB 2g/100 mL premix     2 g 200 mL/hr over 30 Minutes Intravenous On call to O.R. 02/22/19 7322 02/22/19 1142    .  He was given sequential compression devices, early ambulation, and xarelto for DVT prophylaxis.  He benefited maximally from the hospital stay and there were no complications.    Recent vital signs:  Vitals:   02/23/19 0856 02/23/19 0954  BP: (!) 149/86 (!) 156/98  Pulse:  61  Resp:  15  Temp:  98.1 F (36.7 C)  SpO2:      Recent laboratory studies:  Lab  Results  Component Value Date   HGB 10.7 (L) 02/23/2019   HGB 12.1 (L) 02/21/2019   HGB 14.4 06/27/2018   Lab Results  Component Value Date   WBC 9.6 02/23/2019   PLT 150 02/23/2019   Lab Results  Component Value Date   INR 1.1 02/21/2019   Lab Results  Component Value Date   NA 136 02/23/2019   K 4.1 02/23/2019   CL 106 02/23/2019   CO2 22 02/23/2019   BUN 13 02/23/2019   CREATININE 0.74 02/23/2019   GLUCOSE 156 (H) 02/23/2019    Discharge Medications:   Allergies as of 02/23/2019   No Known Allergies     Medication List    STOP taking these medications   acetaminophen 500 MG tablet Commonly known as: TYLENOL   HYDROcodone-acetaminophen 5-325 MG tablet Commonly known as: NORCO/VICODIN Replaced by: HYDROcodone-acetaminophen 7.5-325 MG tablet     TAKE these medications   aspirin EC 81 MG tablet Take 81 mg by mouth daily.   carvedilol 25 MG tablet Commonly known as: COREG Take 25 mg by mouth 2 (two) times daily with a meal.   diltiazem 120 MG 24 hr capsule Commonly known as: Cardizem CD Take 1 capsule (120 mg total) by mouth daily.   docusate sodium 100 MG capsule Commonly known as: COLACE Take 1 capsule (100 mg total) by  mouth 2 (two) times daily.   HYDROcodone-acetaminophen 7.5-325 MG tablet Commonly known as: NORCO Take 1 tablet by mouth every 4 (four) hours as needed for severe pain (pain score 7-10). Replaces: HYDROcodone-acetaminophen 5-325 MG tablet   lisinopril 40 MG tablet Commonly known as: ZESTRIL Take 40 mg by mouth daily.   Melatonin 3 MG Caps Take 6 mg by mouth at bedtime.   ondansetron 4 MG tablet Commonly known as: ZOFRAN Take 1 tablet (4 mg total) by mouth every 6 (six) hours as needed for nausea.   pantoprazole 40 MG tablet Commonly known as: PROTONIX Take 1 tablet (40 mg total) by mouth daily. What changed: when to take this   PRESCRIPTION MEDICATION Inhale into the lungs at bedtime. CPAP   rosuvastatin 40 MG  tablet Commonly known as: CRESTOR Take 40 mg by mouth at bedtime.   senna 8.6 MG Tabs tablet Commonly known as: SENOKOT Take 1 tablet (8.6 mg total) by mouth 2 (two) times daily. What changed:   how much to take  when to take this  reasons to take this   VITAMIN C PO Take 1 tablet by mouth daily.   Xarelto 20 MG Tabs tablet Generic drug: rivaroxaban Take 20 mg by mouth daily with breakfast.       Diagnostic Studies: Dg Pelvis Portable  Result Date: 02/22/2019 CLINICAL DATA:  Avascular necrosis of the left femoral head. Status post left total hip replacement. EXAM: PORTABLE PELVIS 1-2 VIEWS COMPARISON:  Intraoperative C-arm image of 02/22/2019 FINDINGS: The components of the right total hip prosthesis appear in excellent position in the AP projection. No fractures. The visualized pelvic bones and right hip appear normal. IMPRESSION: Satisfactory appearance of the right total hip prosthesis in the AP projection. Electronically Signed   By: Francene BoyersJames  Maxwell M.D.   On: 02/22/2019 14:31   Dg C-arm 1-60 Min-no Report  Result Date: 02/22/2019 CLINICAL DATA:  Hip arthroplasty EXAM: OPERATIVE LEFT HIP (WITH PELVIS IF PERFORMED) AP VIEWS TECHNIQUE: Fluoroscopic spot image(s) were submitted for interpretation post-operatively. COMPARISON:  None. FINDINGS: Two C-arm fluoroscopic images were obtained intraoperatively and submitted for post operative interpretation. Left total hip arthroplasty hardware appears in its expected alignment. No obvious complication. Please see the performing provider's procedural report for further detail. IMPRESSION: As above. Electronically Signed   By: Duanne GuessNicholas  Plundo M.D.   On: 02/22/2019 13:37   Dg Hip Operative Unilat W Or W/o Pelvis Left  Result Date: 02/22/2019 CLINICAL DATA:  Hip arthroplasty EXAM: OPERATIVE LEFT HIP (WITH PELVIS IF PERFORMED) AP VIEWS TECHNIQUE: Fluoroscopic spot image(s) were submitted for interpretation post-operatively. COMPARISON:  None.  FINDINGS: Two C-arm fluoroscopic images were obtained intraoperatively and submitted for post operative interpretation. Left total hip arthroplasty hardware appears in its expected alignment. No obvious complication. Please see the performing provider's procedural report for further detail. IMPRESSION: As above. Electronically Signed   By: Duanne GuessNicholas  Plundo M.D.   On: 02/22/2019 13:37    Disposition: Discharge disposition: 01-Home or Self Care       Discharge Instructions    Call MD / Call 911   Complete by: As directed    If you experience chest pain or shortness of breath, CALL 911 and be transported to the hospital emergency room.  If you develope a fever above 101 F, pus (white drainage) or increased drainage or redness at the wound, or calf pain, call your surgeon's office.   Constipation Prevention   Complete by: As directed    Drink plenty of  fluids.  Prune juice may be helpful.  You may use a stool softener, such as Colace (over the counter) 100 mg twice a day.  Use MiraLax (over the counter) for constipation as needed.   Diet - low sodium heart healthy   Complete by: As directed    Driving restrictions   Complete by: As directed    No driving for 6 weeks   Increase activity slowly as tolerated   Complete by: As directed    Lifting restrictions   Complete by: As directed    No lifting for 6 weeks   TED hose   Complete by: As directed    Use stockings (TED hose) for 2 weeks on both leg(s).  You may remove them at night for sleeping.      Follow-up Information    Kailen Name, Arlys John, MD. Schedule an appointment as soon as possible for a visit in 2 weeks.   Specialty: Orthopedic Surgery Why: For wound re-check Contact information: [address] [place] [address] [place] [address] [phone]            Signed: Iline Oven Giavanni Odonovan 02/23/2019, 12:45 PM

## 2019-02-23 NOTE — Progress Notes (Signed)
Physical Therapy Treatment Patient Details Name: Cameron Schultz MRN: [id number] DOB: 07/06/1955 Today's Date: 02/23/2019    History of Present Illness Pt is 63 y.o. male s/p Lt THA ant approach on 02/22/19 with PMH significant for CAD, HTN, MI, A-fib, and CABG.    PT Comments    Pt progressing well with mobility including negotiating stairs.  Pt reviewed car transfers and is eager for dc home this date.   Follow Up Recommendations  Follow surgeon's recommendation for DC plan and follow-up therapies     Equipment Recommendations  Rolling walker with 5" wheels;3in1 (PT)    Recommendations for Other Services       Precautions / Restrictions Precautions Precautions: Fall Restrictions Weight Bearing Restrictions: No    Mobility  Bed Mobility Overal bed mobility: Needs Assistance Bed Mobility: Supine to Sit     Supine to sit: Supervision     General bed mobility comments: no use of bed rails  Transfers Overall transfer level: Needs assistance Equipment used: Rolling walker (2 wheeled) Transfers: Sit to/from Stand Sit to Stand: Min guard;Supervision         General transfer comment: cues for LE management and use of UEs to self assist  Ambulation/Gait Ambulation/Gait assistance: Min guard;Supervision Gait Distance (Feet): 250 Feet Assistive device: Rolling walker (2 wheeled) Gait Pattern/deviations: Step-through pattern;Decreased step length - right;Decreased step length - left;Decreased stance time - left;Decreased stride length;Decreased weight shift to left Gait velocity: decreased   General Gait Details: min cues for posture and position from [employer] Stairs: Yes Stairs assistance: Min guard Stair Management: No rails;Step to pattern;Forwards;With walker Number of Stairs: 2 General stair comments: singel step twice fwd with RW and cues for sequence   Wheelchair Mobility    Modified Rankin (Stroke Patients Only)       Balance Overall balance  assessment: Needs assistance Sitting-balance support: Feet supported;No upper extremity supported Sitting balance-Leahy Scale: Good     Standing balance support: During functional activity;Bilateral upper extremity supported Standing balance-Leahy Scale: Fair                              Cognition Arousal/Alertness: Awake/alert Behavior During Therapy: WFL for tasks assessed/performed Overall Cognitive Status: Within Functional Limits for tasks assessed                                        Exercises Total Joint Exercises Ankle Circles/Pumps: AROM;Both;Seated;15 reps Quad Sets: AROM;Supine;Left;10 reps Heel Slides: AROM;Supine;Left;20 reps Hip ABduction/ADduction: AAROM;Left;15 reps;Supine Long Arc Quad: AAROM;Left;10 reps;Seated    General Comments        Pertinent Vitals/Pain Pain Assessment: 0-10 Pain Score: 6  Pain Location: L hip Pain Descriptors / Indicators: Aching;Sore Pain Intervention(s): Limited activity within patient's tolerance;Monitored during session;Premedicated before session;Ice applied    Home Living                      Prior Function            PT Goals (current goals can now be found in the care plan section) Acute Rehab PT Goals Patient Stated Goal: pt wants to progress back to walking with no device PT Goal Formulation: With patient Time For Goal Achievement: 03/01/19 Potential to Achieve Goals: Good Progress towards PT goals: Progressing toward goals    Frequency  7X/week      PT Plan Current plan remains appropriate    Co-evaluation              AM-PAC PT "6 Clicks" Mobility   Outcome Measure  Help needed turning from your back to your side while in a flat bed without using bedrails?: A Little Help needed moving from lying on your back to sitting on the side of a flat bed without using bedrails?: A Little Help needed moving to and from a bed to a chair (including a wheelchair)?:  A Little Help needed standing up from a chair using your arms (e.g., wheelchair or bedside chair)?: A Little Help needed to walk in hospital room?: A Little Help needed climbing 3-5 steps with a railing? : A Little 6 Click Score: 18    End of Session Equipment Utilized During Treatment: Gait belt Activity Tolerance: Patient tolerated treatment well Patient left: Other (comment)(sitting EOB) Nurse Communication: Mobility status PT Visit Diagnosis: Other abnormalities of gait and mobility (R26.89);Difficulty in walking, not elsewhere classified (R26.2);Muscle weakness (generalized) (M62.81)     Time: 7846-9629 PT Time Calculation (min) (ACUTE ONLY): 22 min  Charges:  $Gait Training: 8-22 mins $Therapeutic Exercise: 23-37 mins                     [place] Pager [phone] Office [phone]    Marilyn Nihiser 02/23/2019, 12:19 PM

## 2019-02-23 NOTE — Progress Notes (Signed)
Physical Therapy Treatment Patient Details Name: Cameron Schultz MRN: [id number] DOB: [birth year] Today's Date: 02/23/2019    History of Present Illness Pt is 63 y.o. male s/p Lt THA ant approach on 02/22/19 with PMH significant for CAD, HTN, MI, A-fib, and CABG.    PT Comments    Pt performed home therex program with written instruction provided and progression reviewed.   Follow Up Recommendations  Follow surgeon's recommendation for DC plan and follow-up therapies     Equipment Recommendations  Rolling walker with 5" wheels;3in1 (PT)    Recommendations for Other Services       Precautions / Restrictions Precautions Precautions: Fall Restrictions Weight Bearing Restrictions: No    Mobility  Bed Mobility Overal bed mobility: Needs Assistance Bed Mobility: Supine to Sit     Supine to sit: Supervision     General bed mobility comments: no use of bed rails  Transfers                    Ambulation/Gait                 Stairs             Wheelchair Mobility    Modified Rankin (Stroke Patients Only)       Balance                                            Cognition Arousal/Alertness: Awake/alert Behavior During Therapy: WFL for tasks assessed/performed Overall Cognitive Status: Within Functional Limits for tasks assessed                                        Exercises Total Joint Exercises Ankle Circles/Pumps: AROM;Both;Seated;15 reps Quad Sets: AROM;Supine;Left;10 reps Heel Slides: AROM;Supine;Left;20 reps Hip ABduction/ADduction: AAROM;Left;15 reps;Supine Long Arc Quad: AAROM;Left;10 reps;Seated    General Comments        Pertinent Vitals/Pain Pain Assessment: 0-10 Pain Score: 6  Pain Location: L hip Pain Descriptors / Indicators: Aching;Sore Pain Intervention(s): Limited activity within patient's tolerance;Monitored during session;Premedicated before session    Home Living                      Prior Function            PT Goals (current goals can now be found in the care plan section) Acute Rehab PT Goals Patient Stated Goal: pt wants to progress back to walking with no device PT Goal Formulation: With patient Time For Goal Achievement: 03/01/19 Potential to Achieve Goals: Good Progress towards PT goals: Progressing toward goals    Frequency    7X/week      PT Plan Current plan remains appropriate    Co-evaluation              AM-PAC PT "6 Clicks" Mobility   Outcome Measure  Help needed turning from your back to your side while in a flat bed without using bedrails?: A Little Help needed moving from lying on your back to sitting on the side of a flat bed without using bedrails?: A Little Help needed moving to and from a bed to a chair (including a wheelchair)?: A Little Help needed standing up from a chair using your arms (e.g., wheelchair or bedside chair)?: A Little  Help needed to walk in hospital room?: A Little Help needed climbing 3-5 steps with a railing? : A Little 6 Click Score: 18    End of Session   Activity Tolerance: Patient tolerated treatment well Patient left: Other (comment)(sitting EOB)   PT Visit Diagnosis: Other abnormalities of gait and mobility (R26.89);Difficulty in walking, not elsewhere classified (R26.2);Muscle weakness (generalized) (M62.81)     Time: 2956-2130 PT Time Calculation (min) (ACUTE ONLY): 27 min  Charges:  $Therapeutic Exercise: 23-37 mins                     [place] Pager [phone] Office [phone]    Richell Corker 02/23/2019, 12:14 PM

## 2019-02-23 NOTE — Plan of Care (Signed)
Plan of care reviewed and discussed with the patient. 

## 2019-02-23 NOTE — Progress Notes (Addendum)
    Subjective:  Patient reports pain as mild to moderate.  Denies N/V/CP/SOB.  No c/o.  Objective:   VITALS:   Vitals:   02/22/19 2125 02/22/19 2215 02/23/19 0131 02/23/19 0554  BP:   140/82 (!) 147/96  Pulse: 66 (!) 59 (!) 55 62  Resp: 18 20 18 18   Temp:   98.3 F (36.8 C) 98 F (36.7 C)  TempSrc:   Oral Oral  SpO2:  100% 98% 99%  Weight:      Height:        NAD ABD soft Sensation intact distally Intact pulses distally Dorsiflexion/Plantar flexion intact Incision: dressing C/D/I Compartment soft   Lab Results  Component Value Date   WBC 9.6 02/23/2019   HGB 10.7 (L) 02/23/2019   HCT 33.7 (L) 02/23/2019   MCV 84.9 02/23/2019   PLT 150 02/23/2019   BMET    Component Value Date/Time   NA 136 02/23/2019 0311   K 4.1 02/23/2019 0311   CL 106 02/23/2019 0311   CO2 22 02/23/2019 0311   GLUCOSE 156 (H) 02/23/2019 0311   BUN 13 02/23/2019 0311   CREATININE 0.74 02/23/2019 0311   CALCIUM 8.4 (L) 02/23/2019 0311   GFRNONAA >60 02/23/2019 0311   GFRAA >60 02/23/2019 0311     Assessment/Plan: 1 Day Post-Op   Principal Problem:   Avascular necrosis of bone of left hip ([facility]) Active Problems:   Avascular necrosis of hip, left ([facility])   WBAT with walker DVT ppx: Xarelto, SCDs, TEDS PO pain control PT/OT Dispo: D/C home with HEP   Hilton Cork Shaunta Oncale 02/23/2019, 7:45 AM   Rod Can, MD Cell: [phone] [facility] now [facility]  [place] [address], [place], [place] [address] Phone: [phone] [email or web address] Facebook  [employer]

## 2019-02-27 ENCOUNTER — Telehealth: Payer: Self-pay | Admitting: *Deleted

## 2019-05-31 ENCOUNTER — Emergency Department (HOSPITAL_COMMUNITY)
Admission: EM | Admit: 2019-05-31 | Discharge: 2019-05-31 | Disposition: A | Discharge status: Home / Self Care | Payer: No Typology Code available for payment source | Attending: Emergency Medicine | Admitting: Emergency Medicine

## 2019-05-31 ENCOUNTER — Encounter (HOSPITAL_COMMUNITY): Payer: Self-pay | Admitting: Emergency Medicine

## 2019-05-31 ENCOUNTER — Emergency Department (HOSPITAL_COMMUNITY): Payer: No Typology Code available for payment source

## 2019-05-31 ENCOUNTER — Other Ambulatory Visit: Payer: Self-pay

## 2019-05-31 ENCOUNTER — Emergency Department (HOSPITAL_BASED_OUTPATIENT_CLINIC_OR_DEPARTMENT_OTHER): Payer: No Typology Code available for payment source

## 2019-05-31 DIAGNOSIS — Z951 Presence of aortocoronary bypass graft: Secondary | ICD-10-CM | POA: Diagnosis not present

## 2019-05-31 DIAGNOSIS — Z20828 Contact with and (suspected) exposure to other viral communicable diseases: Secondary | ICD-10-CM | POA: Diagnosis not present

## 2019-05-31 DIAGNOSIS — I483 Typical atrial flutter: Secondary | ICD-10-CM | POA: Diagnosis not present

## 2019-05-31 DIAGNOSIS — Z7982 Long term (current) use of aspirin: Secondary | ICD-10-CM | POA: Insufficient documentation

## 2019-05-31 DIAGNOSIS — I10 Essential (primary) hypertension: Secondary | ICD-10-CM

## 2019-05-31 DIAGNOSIS — I4892 Unspecified atrial flutter: Secondary | ICD-10-CM | POA: Insufficient documentation

## 2019-05-31 DIAGNOSIS — Z96642 Presence of left artificial hip joint: Secondary | ICD-10-CM | POA: Diagnosis not present

## 2019-05-31 DIAGNOSIS — Z79899 Other long term (current) drug therapy: Secondary | ICD-10-CM | POA: Diagnosis not present

## 2019-05-31 DIAGNOSIS — Z7901 Long term (current) use of anticoagulants: Secondary | ICD-10-CM | POA: Insufficient documentation

## 2019-05-31 DIAGNOSIS — I4891 Unspecified atrial fibrillation: Secondary | ICD-10-CM

## 2019-05-31 DIAGNOSIS — I2510 Atherosclerotic heart disease of native coronary artery without angina pectoris: Secondary | ICD-10-CM

## 2019-05-31 DIAGNOSIS — F17210 Nicotine dependence, cigarettes, uncomplicated: Secondary | ICD-10-CM | POA: Insufficient documentation

## 2019-05-31 DIAGNOSIS — R0602 Shortness of breath: Secondary | ICD-10-CM | POA: Diagnosis present

## 2019-05-31 LAB — BASIC METABOLIC PANEL
Anion gap: 11 (ref 5–15)
BUN: 24 mg/dL — ABNORMAL HIGH (ref 8–23)
CO2: 21 mmol/L — ABNORMAL LOW (ref 22–32)
Calcium: 8.5 mg/dL — ABNORMAL LOW (ref 8.9–10.3)
Chloride: 110 mmol/L (ref 98–111)
Creatinine, Ser: 1.2 mg/dL (ref 0.61–1.24)
GFR calc Af Amer: >60 mL/min (ref >60)
GFR calc non Af Amer: >60 mL/min (ref >60)
Glucose, Bld: 132 mg/dL — ABNORMAL HIGH (ref 70–99)
Potassium: 4 mmol/L (ref 3.5–5.1)
Sodium: 142 mmol/L (ref 135–145)

## 2019-05-31 LAB — URINALYSIS, ROUTINE W REFLEX MICROSCOPIC
Bacteria, UA: NONE SEEN
Bilirubin Urine: NEGATIVE
Glucose, UA: NEGATIVE mg/dL
Hgb urine dipstick: NEGATIVE
Ketones, ur: NEGATIVE mg/dL
Leukocytes,Ua: NEGATIVE
Nitrite: NEGATIVE
Protein, ur: 30 mg/dL — AB
Specific Gravity, Urine: 1.025 (ref 1.005–1.030)
pH: 5 (ref 5.0–8.0)

## 2019-05-31 LAB — RAPID URINE DRUG SCREEN, HOSP PERFORMED
Amphetamines: NOT DETECTED
Barbiturates: NOT DETECTED
Benzodiazepines: NOT DETECTED
Cocaine: NOT DETECTED
Opiates: POSITIVE — AB
Tetrahydrocannabinol: NOT DETECTED

## 2019-05-31 LAB — LACTIC ACID, PLASMA: Lactic Acid, Venous: 1.5 mmol/L (ref 0.5–1.9)

## 2019-05-31 LAB — CBC
HCT: 43.9 % (ref 39.0–52.0)
Hemoglobin: 14 g/dL (ref 13.0–17.0)
MCH: 27.7 pg (ref 26.0–34.0)
MCHC: 31.9 g/dL (ref 30.0–36.0)
MCV: 86.8 fL (ref 80.0–100.0)
Platelets: 208 10*3/uL (ref 150–400)
RBC: 5.06 MIL/uL (ref 4.22–5.81)
RDW: 19.4 % — ABNORMAL HIGH (ref 11.5–15.5)
WBC: 6.5 10*3/uL (ref 4.0–10.5)
nRBC: 0 % (ref 0.0–0.2)

## 2019-05-31 LAB — TROPONIN I (HIGH SENSITIVITY)
Troponin I (High Sensitivity): 34 ng/L — ABNORMAL HIGH (ref <18)
Troponin I (High Sensitivity): 52 ng/L — ABNORMAL HIGH (ref <18)

## 2019-05-31 LAB — ECHOCARDIOGRAM COMPLETE
Height: 74 in
Weight: 4638.48 oz

## 2019-05-31 LAB — PROTIME-INR
INR: 1.3 — ABNORMAL HIGH (ref 0.8–1.2)
Prothrombin Time: 16.3 seconds — ABNORMAL HIGH (ref 11.4–15.2)

## 2019-05-31 LAB — SARS CORONAVIRUS 2 (TAT 6-24 HRS): SARS Coronavirus 2: NEGATIVE

## 2019-05-31 LAB — MAGNESIUM: Magnesium: 1.8 mg/dL (ref 1.7–2.4)

## 2019-05-31 LAB — TSH: TSH: 1.123 u[IU]/mL (ref 0.350–4.500)

## 2019-05-31 MED ORDER — SODIUM CHLORIDE 0.9 % IV BOLUS
1000.0000 mL | Freq: Once | INTRAVENOUS | Status: AC
  Administered 2019-05-31: 1000 mL via INTRAVENOUS

## 2019-05-31 MED ORDER — PROPOFOL 10 MG/ML IV BOLUS
INTRAVENOUS | Status: AC | PRN
  Administered 2019-05-31: 65.75 mg via INTRAVENOUS

## 2019-05-31 MED ORDER — PROPOFOL 10 MG/ML IV BOLUS
0.5000 mg/kg | Freq: Once | INTRAVENOUS | Status: AC
  Administered 2019-05-31: 65.8 mg via INTRAVENOUS
  Filled 2019-05-31: qty 20

## 2019-05-31 NOTE — ED Provider Notes (Signed)
Pt presented with shortness of breath, palpitations.  No CP.  No sig alchol use.  No drug use.   Physical Exam  BP (!) 78/58 (BP Location: Right Arm)   Pulse 72   Temp 97.9 F (36.6 C) (Rectal)   Resp 18   Wt 131.5 kg   SpO2 98%   BMI 37.23 kg/m   Physical Exam Vitals and nursing note reviewed.  Constitutional:      Appearance: He is well-developed. He is ill-appearing and diaphoretic.  HENT:     Head: Normocephalic and atraumatic.     Right Ear: External ear normal.     Left Ear: External ear normal.     Mouth/Throat:     Mouth: Mucous membranes are moist.     Pharynx: Oropharynx is clear.  Eyes:     General: No scleral icterus.       Right eye: No discharge.        Left eye: No discharge.     Conjunctiva/sclera: Conjunctivae normal.  Neck:     Trachea: No tracheal deviation.  Cardiovascular:     Rate and Rhythm: Tachycardia present.  Pulmonary:     Effort: Pulmonary effort is normal. No respiratory distress.     Breath sounds: No stridor.  Abdominal:     General: There is no distension.  Musculoskeletal:        General: No swelling or deformity.     Cervical back: Neck supple.  Skin:    General: Skin is warm.     Findings: No rash.  Neurological:     Mental Status: He is alert.     Cranial Nerves: Cranial nerve deficit: no gross deficits.     ED Course/Procedures   Clinical Course as of May 30 1600  Thu May 31, 2019  0818 63yo male with history of a-fib, MI, HTN, CAD, on Xarelto, prior CABG x 1 vessel, presents with complaint of SHOB with sensation of heart racing, feels light headed with leaning over.    [LM]  [id number] On exam, diaphoretic, cool to the touch, tachycardic with rate in the 102V, BP 85 systolic, as low as 79. Case discussed with Dr. Tomi Bamberger, Er attending who has seen the patient, discussed EKG with cardiology- read as a-flutter. Plan is to cardiovert as patient is unstable with his hypotension.  Cardioversion managed by Dr. Tomi Bamberger, patient was  successfully cardioverted. Completed IV fluid bolus, BP 97/66, will continue with IV fluids.  Initial troponin is 34, will continue to trend.  CBC and BMP without significant changes.  Case discussed with cards master who will consult for disposition planning.   [LM]  1216 Patient was seen by cardiology who feels patient is safe for discharge at this time and may follow-up with his cardiologist.  Patient is advised to return to ER for any concerns.   [LM]    Clinical Course User Index [LM] Tacy Learn, PA-C    .Sedation  Date/Time: 05/31/2019 8:50 AM Performed by: Dorie Rank, MD Authorized by: Dorie Rank, MD   Consent:    Consent obtained:  Verbal   Consent given by:  Patient   Risks discussed:  Allergic reaction, dysrhythmia, inadequate sedation, nausea, prolonged hypoxia resulting in organ damage, prolonged sedation necessitating reversal, respiratory compromise necessitating ventilatory assistance and intubation and vomiting   Alternatives discussed:  Analgesia without sedation, anxiolysis and regional anesthesia Universal protocol:    Procedure explained and questions answered to patient or proxy's satisfaction: yes     Relevant  documents present and verified: yes     Test results available and properly labeled: yes     Imaging studies available: yes     Required blood products, implants, devices, and special equipment available: yes     Site/side marked: yes     Immediately prior to procedure a time out was called: yes     Patient identity confirmation method:  Verbally with patient Indications:    Procedure necessitating sedation performed by:  Physician performing sedation Pre-sedation assessment:    Time since last food or drink:  3   ASA classification: class 2 - patient with mild systemic disease     Neck mobility: normal     Mouth opening:  3 or more finger widths   Thyromental distance:  3 finger widths   Mallampati score:  II - soft palate, uvula, fauces  visible   Pre-sedation assessments completed and reviewed: airway patency, cardiovascular function, hydration status, mental status, nausea/vomiting, pain level, respiratory function and temperature     Pre-sedation assessment completed:  05/31/2019 8:30 AM Immediate pre-procedure details:    Reassessment: Patient reassessed immediately prior to procedure     Reviewed: vital signs, relevant labs/tests and NPO status     Verified: bag valve mask available, emergency equipment available, intubation equipment available, IV patency confirmed, oxygen available and suction available   Procedure details (see MAR for exact dosages):    Preoxygenation:  Nasal cannula   Sedation:  Propofol   Intended level of sedation: deep   Intra-procedure monitoring:  Blood pressure monitoring, cardiac monitor, continuous pulse oximetry, frequent LOC assessments, frequent vital sign checks and continuous capnometry   Intra-procedure events: none     Intra-procedure management:  Supplemental oxygen   Total Provider sedation time (minutes):  15 Post-procedure details:    Post-sedation assessment completed:  05/31/2019 8:51 AM   Attendance: Constant attendance by certified staff until patient recovered     Recovery: Patient returned to pre-procedure baseline     Post-sedation assessments completed and reviewed: airway patency, cardiovascular function, hydration status, mental status, nausea/vomiting, pain level, respiratory function and temperature     Patient is stable for discharge or admission: yes     Patient tolerance:  Tolerated well, no immediate complications .Cardioversion  Date/Time: 05/31/2019 8:51 AM Performed by: Linwood Dibbles, MD Authorized by: Linwood Dibbles, MD   Consent:    Consent obtained:  Verbal   Consent given by:  Patient   Alternatives discussed:  Rate-control medication and delayed treatment Pre-procedure details:    Cardioversion basis:  Emergent   Rhythm:  Atrial flutter Patient sedated:  Yes. Refer to sedation procedure documentation for details of sedation.  Attempt one:    Cardioversion mode:  Synchronous   Waveform:  Biphasic   Shock (Joules):  100   Shock outcome:  Conversion to normal sinus rhythm Post-procedure details:    Patient tolerance of procedure:  Tolerated well, no immediate complications    EKG Interpretation  Date/Time:  Thursday May 31 2019 08:06:53 EST Ventricular Rate:  152 PR Interval:    QRS Duration: 137 QT Interval:  302 QTC Calculation: 481 R Axis:   -21 Text Interpretation: Sinus tachycardia vs a flutter IVCD, consider atypical RBBB Confirmed by Linwood Dibbles [phone]) on 05/31/2019 8:20:22 AM       EKG Interpretation  Date/Time:  Thursday May 31 2019 08:41:10 EST Ventricular Rate:  70 PR Interval:    QRS Duration: 91 QT Interval:  366 QTC Calculation: 395 R Axis:  2 Text Interpretation: Sinus rhythm Left atrial enlargement Probable inferior infarct, recent atrial flutter resolved Confirmed by Linwood DibblesKnapp, Madelene Kaatz [phone]([id number]) on 05/31/2019 8:54:36 AM       MDM  Pt remained tachycardic , hypotensive.   EKG initially suggested sinus tachycardia versus atrial flutter.  EKG reviewed with Dr. Eldridge DaceVaranasi, no stemi consistent with atrial flutter.  Patient has missed some doses of his anticoagulant however he is unstable with hypotension.  Emergent cardioversion performed.  Patient went back into sinus rhythm.  We will continue to monitor.  Labs are pending but he has responded well to his cardioversion.  Medical screening examination/treatment/procedure(s) were conducted as a shared visit with non-physician practitioner(s) and myself.  I personally evaluated the patient during the encounter.  EKG Interpretation  Date/Time:  Thursday May 31 2019 08:41:10 EST Ventricular Rate:  70 PR Interval:    QRS Duration: 91 QT Interval:  366 QTC Calculation: 395 R Axis:   2 Text Interpretation: Sinus rhythm Left atrial enlargement Probable  inferior infarct, recent atrial flutter resolved Confirmed by Linwood DibblesKnapp, Jarek Longton [phone]) on 05/31/2019 8:54:36 AM      Linwood DibblesKnapp, Fern Asmar, MD 05/31/19 1601

## 2019-05-31 NOTE — Consult Note (Addendum)
Cardiology Consultation:   Patient ID: Cameron Schultz MRN: [id number]; DOB: 08/24/1955  Admit date: 05/31/2019 Date of Consult: 05/31/2019  Primary Care Provider: [name], MD Primary Cardiologist: No primary care provider on file.  Primary Electrophysiologist:  None    Patient Profile:   Cameron Schultz is a 63 y.o. male with a hx of paroxysmal Afib on Xarelto, HTN, HLD, OSA on CPAP, tobacco use, and CAD s/p CABG who is being seen today for the evaluation of Afib RVR at the request of Dr. Lynelle Doctor.  History of Present Illness:   Cameron Schultz has not been seen by [facility] in the past. He is followed by a cardiologist at the [facility]. He has a history of CAD s/p CABG in 2013 in [place]., no stents.  He has a history of paroxysmal Afib on Xarelto s/p multiple cardioversions. He was seen in the ED at [facility] 06/27/18 for afib RVR and was successfully cardioverted and discharged home. He was seen again in an outside hospital in July for afib RVR and was again successfully cardioverted and discharged home. He underwent left hip surgery 3 months ago and continues with rehab. He denies alcohol/drug use. He smokes 1 pack per week. He lives with his wife and is on long-term disability. Lifestyle is very sedentary.   The patient presented to the ED 05/31/19 for shortness of breath, sweating, and palpitations. Symptoms started yesterday. He says he knows when he is in afib because he feels short of breath and lightheaded. This is how he felt before previous cardioversion. He believes he went into afib because he missed a couple days of medications. He misplaced his med bag. He took coreg and Xarelto yesterday and today. Denies chest pain, lower leg edema, orthopnea. No recent illness, fever, chills.   In the ED BP 79/62, pulse 152, afebrile, RR 30, 98% O2. EKG showed aflutter, 152 bpm. EKG was reviewed by Dr. Eldridge Dace who confirmed no STEMI. Due to persistent hypotension the patient was cardioverted in  the ED. He went back into NSR. Patient tolerated procedure well. Follow-up EKD shows NSR HR 70s  Labs: Potassium 4.0, chloride 110, sodium 142 CO2 21, glucose 132 Creatinine 1.2 BUN 24 Hs troponin 34 Lactic Acid 1.5 WBC 6.5 Hgb 14.0 COVID pending  CXR: mild intersitial prominence within the mid to lower lung fields bilaterally, likely interstitial edema; atypical/viral cannot be excluded  Heart Pathway Score:     Past Medical History:  Diagnosis Date  . Atrial fibrillation (HCC)   . Coronary artery disease   . Dysrhythmia    a fib  . Hypertension   . Myocardial infarction (HCC)    unknown  . Sleep apnea     Past Surgical History:  Procedure Laterality Date  . CARDIAC SURGERY    . CERVICAL FUSION    . CORONARY ARTERY BYPASS GRAFT  2013   1  . ESOPHAGOGASTRODUODENOSCOPY (EGD) WITH PROPOFOL N/A 12/21/2016   Procedure: ESOPHAGOGASTRODUODENOSCOPY (EGD) WITH PROPOFOL;  Surgeon: Kathi Der, MD;  Location: [facility];  Service: Gastroenterology;  Laterality: N/A;  . TONSILLECTOMY    . TOTAL HIP ARTHROPLASTY Left 02/22/2019   Procedure: TOTAL HIP ARTHROPLASTY ANTERIOR APPROACH;  Surgeon: Samson Frederic, MD;  Location: [facility];  Service: Orthopedics;  Laterality: Left;     Home Medications:  Prior to Admission medications   Medication Sig Start Date End Date Taking? Authorizing Provider  Ascorbic Acid (VITAMIN C PO) Take 1 tablet by mouth daily.    [provider]  aspirin EC 81 MG tablet Take 81 mg by mouth daily.    [provider]  carvedilol (COREG) 25 MG tablet Take 25 mg by mouth 2 (two) times daily with a meal.     [provider]  diltiazem (CARDIZEM CD) 120 MG 24 hr capsule Take 1 capsule (120 mg total) by mouth daily. 03/02/18   Raeford RazorKohut, Stephen, MD  docusate sodium (COLACE) 100 MG capsule Take 1 capsule (100 mg total) by mouth 2 (two) times daily. 02/23/19   Swinteck, Arlys JohnBrian, MD  HYDROcodone-acetaminophen (NORCO) 7.5-325 MG tablet Take 1  tablet by mouth every 4 (four) hours as needed for severe pain (pain score 7-10). 02/23/19   Swinteck, Arlys JohnBrian, MD  lisinopril (PRINIVIL,ZESTRIL) 40 MG tablet Take 40 mg by mouth daily.    [provider]  Melatonin 3 MG CAPS Take 6 mg by mouth at bedtime.    [provider]  ondansetron (ZOFRAN) 4 MG tablet Take 1 tablet (4 mg total) by mouth every 6 (six) hours as needed for nausea. 02/23/19   Swinteck, Arlys JohnBrian, MD  pantoprazole (PROTONIX) 40 MG tablet Take 1 tablet (40 mg total) by mouth daily. Patient taking differently: Take 40 mg by mouth 2 (two) times daily.  12/23/16   Marquette SaaLancaster, Abigail Joseph, MD  PRESCRIPTION MEDICATION Inhale into the lungs at bedtime. CPAP    [provider]  rivaroxaban (XARELTO) 20 MG TABS tablet Take 20 mg by mouth daily with breakfast.     [provider]  rosuvastatin (CRESTOR) 40 MG tablet Take 40 mg by mouth at bedtime.    [provider]  senna (SENOKOT) 8.6 MG TABS tablet Take 1 tablet (8.6 mg total) by mouth 2 (two) times daily. 02/23/19   Samson FredericSwinteck, Brian, MD    Inpatient Medications: Scheduled Meds:  Continuous Infusions:  PRN Meds:   Allergies:   No Known Allergies  Social History:   Social History   Socioeconomic History  . Marital status: Married    Spouse name: Not on file  . Number of children: Not on file  . Years of education: Not on file  . Highest education level: Not on file  Occupational History  . Not on file  Tobacco Use  . Smoking status: Current Every Day Smoker    Packs/day: 0.25    Years: 15.00    Pack years: 3.75  . Smokeless tobacco: Never Used  Substance and Sexual Activity  . Alcohol use: No  . Drug use: No  . Sexual activity: Not on file  Other Topics Concern  . Not on file  Social History Narrative  . Not on file   Social Determinants of Health   Financial Resource Strain:   . Difficulty of Paying Living Expenses: Not on file  Food Insecurity:   . Worried About  Programme researcher, broadcasting/film/videounning Out of Food in the Last Year: Not on file  . Ran Out of Food in the Last Year: Not on file  Transportation Needs:   . Lack of Transportation (Medical): Not on file  . Lack of Transportation (Non-Medical): Not on file  Physical Activity:   . Days of Exercise per Week: Not on file  . Minutes of Exercise per Session: Not on file  Stress:   . Feeling of Stress : Not on file  Social Connections:   . Frequency of Communication with Friends and Family: Not on file  . Frequency of Social Gatherings with Friends and Family: Not on file  . Attends Religious Services: Not  on file  . Active Member of Clubs or Organizations: Not on file  . Attends Banker Meetings: Not on file  . Marital Status: Not on file  Intimate Partner Violence:   . Fear of Current or Ex-Partner: Not on file  . Emotionally Abused: Not on file  . Physically Abused: Not on file  . Sexually Abused: Not on file    Family History:   Family History  Problem Relation Age of Onset  . Heart attack Father        in his 56s     ROS:  Please see the history of present illness.  All other ROS reviewed and negative.     Physical Exam/Data:   Vitals:   05/31/19 0910 05/31/19 0930 05/31/19 0934 05/31/19 1000  BP: 96/79 101/65 97/66   Pulse: 69   66  Resp: (!) Temp:      TempSrc:      SpO2: 100%   100%  Weight:      Height:        Intake/Output Summary (Last 24 hours) at 05/31/2019 1121 Last data filed at 05/31/2019 0947 Gross per 24 hour  Intake 1000 ml  Output --  Net 1000 ml   Last 3 Weights 05/31/2019 05/31/2019 02/22/2019  Weight (lbs) 289 lb 14.5 oz 290 lb 284 lb  Weight (kg) 131.5 kg 131.543 kg 128.822 kg     Body mass index is 37.22 kg/m.  General:  Well nourished, well developed, in no acute distress HEENT: normal Lymph: no adenopathy Neck: no JVD Endocrine:  No thryomegaly Vascular: No carotid bruits; FA pulses 2+ bilaterally without bruits  Cardiac:  normal S1,  S2; RRR; no murmur  Lungs:  clear to auscultation bilaterally, no wheezing, rhonchi or rales; 9L O2 Abd: soft, nontender, no hepatomegaly  Ext: no edema Musculoskeletal:  No deformities, BUE and BLE strength normal and equal Skin: warm and dry  Neuro:  CNs 2-12 intact, no focal abnormalities noted Psych:  Normal affect   EKG:  The EKG was personally reviewed and demonstrates:  EKG NSR, HR 70s, poor R wave progression Telemetry:  Telemetry was personally reviewed and demonstrates:  Prior to DCCV appears to be sinus tach vs aflutter (regular rhythym)rates in the 150s. Post DCCV NSR, HR in the 60s with PACs and PVCs  Relevant CV Studies:  Echo ordered  Laboratory Data:  High Sensitivity Troponin:   Recent Labs  Lab 05/31/19 0821 05/31/19 1012  TROPONINIHS 34* 52*     Chemistry Recent Labs  Lab 05/31/19 0821  NA 142  K 4.0  CL 110  CO2 21*  GLUCOSE 132*  BUN 24*  CREATININE 1.20  CALCIUM 8.5*  GFRNONAA >60  GFRAA >60  ANIONGAP 11    No results for input(s): PROT, ALBUMIN, AST, ALT, ALKPHOS, BILITOT in the last 168 hours. Hematology Recent Labs  Lab 05/31/19 0821  WBC 6.5  RBC 5.06  HGB 14.0  HCT 43.9  MCV 86.8  MCH 27.7  MCHC 31.9  RDW 19.4*  PLT 208   BNPNo results for input(s): BNP, PROBNP in the last 168 hours.  DDimer No results for input(s): DDIMER in the last 168 hours.   Radiology/Studies:  DG Chest Port 1 View  Result Date: 05/31/2019 CLINICAL DATA:  Shortness of breath. Additional history provided: Patient reports he is in atrial fibrillation with shortness of breath and sweating since yesterday. EXAM: PORTABLE CHEST 1 VIEW COMPARISON:  Chest radiograph 06/27/2018  FINDINGS: Unchanged cardiomegaly. Prior median sternotomy. Mild interstitial prominence within the mid to lower lung fields bilaterally. No pleural effusion or evidence of pneumothorax. No acute bony abnormality. Overlying cardiac monitoring leads. IMPRESSION: Cardiomegaly. Mild  interstitial prominence within the mid to lower lung fields bilaterally. Findings likely reflect interstitial edema given provided history. Atypical/viral infection cannot be definitively excluded. Electronically Signed   By: Jackey Loge DO   On: 05/31/2019 08:40   Assessment and Plan:   Paroxysmal Afib RVR/Aflutter Patient with known history of PAF s/p multiple cardioversion. He was on Coreg for rate control. He presented with his "usual symptoms" SOB and palpitations when in Afib. EKG shows aflutter rate 152. He said he had taken Xarelto and Coreg yesterday and today (but missed previous doses). Due to persistent hypotension he was successfully cardioverted in the ED.  - follow up EKG with NSR HR 70s. Pressures still soft - Potassium 4.0 - Magnesium 1.8, goal >2 - Check TSH  - check echo - continue Xarelto - He is on Coreg 25 mg BID for rate control>>held for soft pressures - He is on supplemental oxygen>> would try to wean - Can consider discharge with outpatient follow-up since patient feels OK and wants to go home. Would need close follow-up at is cardiologist at the [facility].  - MD to see  CAD s/p CABG - No chest pain - HS troponin 34 > 52, not consistent with ACS - EKG with no ischemic changes - continue Asprin - No further ischemic work-up  Elevated Troponin - minimally elevated, flat trend - likely from Aflutter vs DCCV  HTN - Lisinopril 40 and coreg at baseline>>hold for soft pressures  HLD - continue statin   For questions or updates, please contact [facility] Please consult [email or web address] for contact info under     Signed, Cadence David Stall, PA-C  05/31/2019 11:21 AM   Attending Note:   The patient was seen and examined.  Agree with assessment and plan as noted above.  Changes made to the above note as needed.  Patient seen and independently examined with  Cadence Fransico Michael, PA .   We discussed all aspects of the encounter. I agree with the assessment and plan as  stated above.  1.  Atrial flutter: The patient was successfully cardioverted to normal sinus rhythm.  He notes that he went back into atrial flutter yesterday and he started taking Xarelto yesterday.  He ran out of his medicines temporarily.  He is awake and alert following cardioversion and is feeling well.  I explained the importance of continuing to take Xarelto for the next month.  2.  Hypertension: He is on diltiazem, Coreg and lisinopril.  His blood pressure is slightly low today following cardioversion.  I have asked him to hold his lisinopril until he is seen by his cardiologist at the [facility].  He admits to eating lots of salty foods and I encouraged him to stay away from salty foods.  3.  Coronary artery disease: He is not having any episodes of angina.  You follow-up with his medical doctors and cardiologist at the [place].  We are available to see him if needed.   I have spent a total of 40 minutes with patient reviewing hospital  notes , telemetry, EKGs, labs and examining patient as well as establishing an assessment and plan that was discussed with the patient. > 50% of time was spent in direct patient care.    Vesta Mixer, Montez Hageman., MD, [facility]  05/31/2019, 12:26 PM [address]  [place] Pager [phone]

## 2019-05-31 NOTE — ED Notes (Signed)
Pt verbalized understanding of discharge instructions. Follow up care reviewed, no further questions at this time. Pt ambulated independently to lobby.

## 2019-05-31 NOTE — ED Provider Notes (Addendum)
[facility] Provider Note   CSN: [id number] Arrival date & time: 05/31/19  0732     History Chief Complaint  Patient presents with  . Shortness of Breath  . Excessive Sweating  . Atrial Fibrillation    Cameron PloverRichard Schultz is a 63 y.o. male.  63 year old male presents with complaint of A. fib, states that he has felt his heart racing for the past 2 days with shortness of breath and feels lightheaded if he bends over.  Patient has a prior history of A. fib with RVR, last cardioverted in July.  Patient states that he has missed his Xarelto recently because of the holidays, states he did take his Xarelto yesterday and again today.  Patient took his carvedilol at 2:00 this morning and again on another dose just before coming into the ER this morning.  [facility], reports prior CABG x1 vessel, no stents.        Past Medical History:  Diagnosis Date  . Atrial fibrillation (HCC)   . Coronary artery disease   . Dysrhythmia    a fib  . Hypertension   . Myocardial infarction (HCC)    unknown  . Sleep apnea     Patient Active Problem List   Diagnosis Date Noted  . Avascular necrosis of bone of left hip (HCC) 02/22/2019  . Avascular necrosis of hip, left (HCC) 02/22/2019  . GI bleed 12/20/2016  . Acute upper GI bleed   . Chronic anticoagulation     Past Surgical History:  Procedure Laterality Date  . CARDIAC SURGERY    . CERVICAL FUSION    . CORONARY ARTERY BYPASS GRAFT  2013   1  . ESOPHAGOGASTRODUODENOSCOPY (EGD) WITH PROPOFOL N/A 12/21/2016   Procedure: ESOPHAGOGASTRODUODENOSCOPY (EGD) WITH PROPOFOL;  Surgeon: Kathi DerBrahmbhatt, Parag, MD;  Location: [facility] END[facility];  Service: Gastroenterology;  Laterality: N/A;  . TONSILLECTOMY    . TOTAL HIP ARTHROPLASTY Left 02/22/2019   Procedure: TOTAL HIP ARTHROPLASTY ANTERIOR APPROACH;  Surgeon: Samson FredericSwinteck, Brian, MD;  Location: [facility] ORS;  Service: Orthopedics;  Laterality: Left;        Family History  Problem Relation Age of Onset  . Heart attack Father        in his 8750s    Social History   Tobacco Use  . Smoking status: Current Every Day Smoker    Packs/day: 0.25    Years: 15.00    Pack years: 3.75  . Smokeless tobacco: Never Used  Substance Use Topics  . Alcohol use: No  . Drug use: No    Home Medications Prior to Admission medications   Medication Sig Start Date End Date Taking? Authorizing Provider  Ascorbic Acid (VITAMIN C PO) Take 1 tablet by mouth daily.    [provider]  aspirin EC 81 MG tablet Take 81 mg by mouth daily.    [provider]  carvedilol (COREG) 25 MG tablet Take 25 mg by mouth 2 (two) times daily with a meal.     [provider]  diltiazem (CARDIZEM CD) 120 MG 24 hr capsule Take 1 capsule (120 mg total) by mouth daily. 03/02/18   Raeford RazorKohut, Stephen, MD  docusate sodium (COLACE) 100 MG capsule Take 1 capsule (100 mg total) by mouth 2 (two) times daily. 02/23/19   Swinteck, Arlys JohnBrian, MD  HYDROcodone-acetaminophen (NORCO) 7.5-325 MG tablet Take 1 tablet by mouth every 4 (four) hours as needed for severe pain (pain score 7-10). 02/23/19   Swinteck, Arlys JohnBrian,  MD  lisinopril (PRINIVIL,ZESTRIL) 40 MG tablet Take 40 mg by mouth daily.    [provider]  Melatonin 3 MG CAPS Take 6 mg by mouth at bedtime.    [provider]  ondansetron (ZOFRAN) 4 MG tablet Take 1 tablet (4 mg total) by mouth every 6 (six) hours as needed for nausea. 02/23/19   Swinteck, Arlys John, MD  pantoprazole (PROTONIX) 40 MG tablet Take 1 tablet (40 mg total) by mouth daily. Patient taking differently: Take 40 mg by mouth 2 (two) times daily.  12/23/16   Marquette Saa, MD  PRESCRIPTION MEDICATION Inhale into the lungs at bedtime. CPAP    [provider]  rivaroxaban (XARELTO) 20 MG TABS tablet Take 20 mg by mouth daily with breakfast.     [provider]  rosuvastatin (CRESTOR) 40 MG tablet Take 40 mg by  mouth at bedtime.    [provider]  senna (SENOKOT) 8.6 MG TABS tablet Take 1 tablet (8.6 mg total) by mouth 2 (two) times daily. 02/23/19   Samson Frederic, MD    Allergies    Patient has no known allergies.  Review of Systems   Review of Systems  Constitutional: Positive for diaphoresis. Negative for fever.  Respiratory: Positive for shortness of breath.   Cardiovascular: Positive for palpitations. Negative for chest pain.  Gastrointestinal: Negative for nausea and vomiting.  Musculoskeletal: Negative for arthralgias and myalgias.  Skin: Negative for rash and wound.  Allergic/Immunologic: Negative for immunocompromised state.  Neurological: Positive for light-headedness.  Hematological: Bruises/bleeds easily.  Psychiatric/Behavioral: Negative for confusion.  All other systems reviewed and are negative.   Physical Exam Updated Vital Signs BP 91/66   Pulse (!) 49   Temp (!) 97.5 F (36.4 C) (Oral)   Resp 13   Ht  (1.88 m)   Wt 131.5 kg   SpO2 96%   BMI 37.22 kg/m   Physical Exam Vitals and nursing note reviewed.  Constitutional:      General: He is not in acute distress.    Appearance: He is well-developed. He is diaphoretic.  HENT:     Head: Normocephalic and atraumatic.  Cardiovascular:     Rate and Rhythm: Tachycardia present.     Pulses: Normal pulses.  Pulmonary:     Effort: Pulmonary effort is normal.     Breath sounds: Normal breath sounds. No decreased breath sounds.  Chest:     Chest wall: No tenderness.  Abdominal:     Palpations: Abdomen is soft.     Tenderness: There is no abdominal tenderness.  Musculoskeletal:     Right lower leg: No edema.     Left lower leg: No edema.  Skin:    Comments: Cool to the touch  Neurological:     Mental Status: He is alert and oriented to person, place, and time.  Psychiatric:        Behavior: Behavior normal.     ED Results / Procedures / Treatments   Labs (all labs ordered are listed, but  only abnormal results are displayed) Labs Reviewed  BASIC METABOLIC PANEL - Abnormal; Notable for the following components:      Result Value   CO2 21 (*)    Glucose, Bld 132 (*)    BUN 24 (*)    Calcium 8.5 (*)    All other components within normal limits  CBC - Abnormal; Notable for the following components:   RDW 19.4 (*)    All other components within  normal limits  RAPID URINE DRUG SCREEN, HOSP PERFORMED - Abnormal; Notable for the following components:   Opiates POSITIVE (*)    All other components within normal limits  URINALYSIS, ROUTINE W REFLEX MICROSCOPIC - Abnormal; Notable for the following components:   Protein, ur 30 (*)    All other components within normal limits  PROTIME-INR - Abnormal; Notable for the following components:   Prothrombin Time 16.3 (*)    INR 1.3 (*)    All other components within normal limits  TROPONIN I (HIGH SENSITIVITY) - Abnormal; Notable for the following components:   Troponin I (High Sensitivity) 34 (*)    All other components within normal limits  TROPONIN I (HIGH SENSITIVITY) - Abnormal; Notable for the following components:   Troponin I (High Sensitivity) 52 (*)    All other components within normal limits  SARS CORONAVIRUS 2 (TAT 6-24 HRS)  MAGNESIUM  LACTIC ACID, PLASMA  TSH    EKG EKG Interpretation  Date/Time:  Thursday May 31 2019 08:41:10 EST Ventricular Rate:  70 PR Interval:    QRS Duration: 91 QT Interval:  366 QTC Calculation: 395 R Axis:   2 Text Interpretation: Sinus rhythm Left atrial enlargement Probable inferior infarct, recent atrial flutter resolved Confirmed by Linwood Dibbles [phone]) on 05/31/2019 8:54:36 AM   Radiology No results found.  Procedures .Critical Care Performed by: Jeannie Fend, PA-C Authorized by: Jeannie Fend, PA-C   Critical care provider statement:    Critical care time (minutes):  45   Critical care was necessary to treat or prevent imminent or life-threatening  deterioration of the following conditions:  Circulatory failure   Critical care was time spent personally by me on the following activities:  Discussions with consultants, evaluation of patient's response to treatment, examination of patient, ordering and performing treatments and interventions, ordering and review of laboratory studies, ordering and review of radiographic studies, pulse oximetry, re-evaluation of patient's condition, obtaining history from patient or surrogate and review of old charts   (including critical care time)  Medications Ordered in ED Medications  sodium chloride 0.9 % bolus 1,000 mL (0 mLs Intravenous Stopped 05/31/19 0947)  propofol (DIPRIVAN) 10 mg/mL bolus/IV push 65.8 mg (65.8 mg Intravenous Given 05/31/19 0839)  propofol (DIPRIVAN) 10 mg/mL bolus/IV push (65.75 mg Intravenous Given 05/31/19 9924)    ED Course  I have reviewed the triage vital signs and the nursing notes.  Pertinent labs & imaging results that were available during my care of the patient were reviewed by me and considered in my medical decision making (see chart for details).  Clinical Course as of Jun 14 1598  Thu May 31, 2019  0818 63yo male with history of a-fib, MI, HTN, CAD, on Xarelto, prior CABG x 1 vessel, presents with complaint of SHOB with sensation of heart racing, feels light headed with leaning over.    [LM]  [id number] On exam, diaphoretic, cool to the touch, tachycardic with rate in the 150s, BP 85 systolic, as low as 79. Case discussed with Dr. Lynelle Doctor, Er attending who has seen the patient, discussed EKG with cardiology- read as a-flutter. Plan is to cardiovert as patient is unstable with his hypotension.  Cardioversion managed by Dr. Lynelle Doctor, patient was successfully cardioverted. Completed IV fluid bolus, BP 97/66, will continue with IV fluids.  Initial troponin is 34, will continue to trend.  CBC and BMP without significant changes.  Case discussed with cards master who will consult  for disposition planning.   [  LM]  1216 Patient was seen by cardiology who feels patient is safe for discharge at this time and may follow-up with his cardiologist.  Patient is advised to return to ER for any concerns.   [LM]    Clinical Course User Index [LM] Roque Lias   MDM Rules/Calculators/A&P                       Final Clinical Impression(s) / ED Diagnoses Final diagnoses:  Atrial flutter with rapid ventricular response [facility])    Rx / DC Orders ED Discharge Orders    None       Roque Lias 05/31/19 1217    Dorie Rank, MD 06/01/19 0708    Tacy Learn, PA-C 06/15/19 1600    Dorie Rank, MD 06/16/19 [id number]

## 2019-05-31 NOTE — ED Triage Notes (Signed)
Pt. Stated, Im in A-fib. I know when I am. Cameron Schultz got some SOB , Ive been having some jerking and sweating., started yesterday. I thought I could medicate but it was not working.

## 2019-05-31 NOTE — Progress Notes (Signed)
  Echocardiogram 2D Echocardiogram has been performed.  Cameron Schultz A Jamaris Biernat 05/31/2019, 12:18 PM

## 2019-05-31 NOTE — ED Notes (Addendum)
1L NS started at 128ml/hr per PA verbal order.

## 2019-05-31 NOTE — ED Triage Notes (Signed)
Pt. Stated, Iam having back spasms.

## 2019-05-31 NOTE — ED Notes (Signed)
ECHO at bedside.

## 2019-05-31 NOTE — Discharge Instructions (Addendum)
Follow up with your cardiologist as planned. Return to the ER for any concerns.

## 2019-08-18 ENCOUNTER — Encounter (HOSPITAL_COMMUNITY): Payer: Self-pay | Admitting: Family Medicine

## 2019-08-18 ENCOUNTER — Inpatient Hospital Stay (HOSPITAL_COMMUNITY)
Admission: EM | Admit: 2019-08-18 | Discharge: 2019-08-20 | DRG: 309 | Disposition: A | Discharge status: Home / Self Care | Payer: No Typology Code available for payment source | Attending: Internal Medicine | Admitting: Internal Medicine

## 2019-08-18 ENCOUNTER — Emergency Department (HOSPITAL_COMMUNITY): Payer: No Typology Code available for payment source

## 2019-08-18 ENCOUNTER — Other Ambulatory Visit: Payer: Self-pay

## 2019-08-18 DIAGNOSIS — I4891 Unspecified atrial fibrillation: Secondary | ICD-10-CM | POA: Diagnosis present

## 2019-08-18 DIAGNOSIS — I340 Nonrheumatic mitral (valve) insufficiency: Secondary | ICD-10-CM | POA: Diagnosis not present

## 2019-08-18 DIAGNOSIS — I2510 Atherosclerotic heart disease of native coronary artery without angina pectoris: Secondary | ICD-10-CM

## 2019-08-18 DIAGNOSIS — R42 Dizziness and giddiness: Secondary | ICD-10-CM

## 2019-08-18 DIAGNOSIS — I4892 Unspecified atrial flutter: Secondary | ICD-10-CM

## 2019-08-18 DIAGNOSIS — Z20822 Contact with and (suspected) exposure to covid-19: Secondary | ICD-10-CM | POA: Diagnosis present

## 2019-08-18 DIAGNOSIS — I248 Other forms of acute ischemic heart disease: Secondary | ICD-10-CM | POA: Diagnosis present

## 2019-08-18 DIAGNOSIS — I252 Old myocardial infarction: Secondary | ICD-10-CM | POA: Diagnosis not present

## 2019-08-18 DIAGNOSIS — I480 Paroxysmal atrial fibrillation: Principal | ICD-10-CM | POA: Diagnosis present

## 2019-08-18 DIAGNOSIS — E785 Hyperlipidemia, unspecified: Secondary | ICD-10-CM | POA: Diagnosis present

## 2019-08-18 DIAGNOSIS — Z96642 Presence of left artificial hip joint: Secondary | ICD-10-CM | POA: Diagnosis present

## 2019-08-18 DIAGNOSIS — Z8249 Family history of ischemic heart disease and other diseases of the circulatory system: Secondary | ICD-10-CM

## 2019-08-18 DIAGNOSIS — Z79899 Other long term (current) drug therapy: Secondary | ICD-10-CM

## 2019-08-18 DIAGNOSIS — Z720 Tobacco use: Secondary | ICD-10-CM

## 2019-08-18 DIAGNOSIS — Z7982 Long term (current) use of aspirin: Secondary | ICD-10-CM | POA: Diagnosis not present

## 2019-08-18 DIAGNOSIS — I10 Essential (primary) hypertension: Secondary | ICD-10-CM

## 2019-08-18 DIAGNOSIS — Z9119 Patient's noncompliance with other medical treatment and regimen: Secondary | ICD-10-CM | POA: Diagnosis not present

## 2019-08-18 DIAGNOSIS — Z951 Presence of aortocoronary bypass graft: Secondary | ICD-10-CM | POA: Diagnosis not present

## 2019-08-18 DIAGNOSIS — G4733 Obstructive sleep apnea (adult) (pediatric): Secondary | ICD-10-CM

## 2019-08-18 DIAGNOSIS — E669 Obesity, unspecified: Secondary | ICD-10-CM

## 2019-08-18 DIAGNOSIS — F17210 Nicotine dependence, cigarettes, uncomplicated: Secondary | ICD-10-CM | POA: Diagnosis present

## 2019-08-18 DIAGNOSIS — Z7901 Long term (current) use of anticoagulants: Secondary | ICD-10-CM | POA: Diagnosis not present

## 2019-08-18 DIAGNOSIS — E7800 Pure hypercholesterolemia, unspecified: Secondary | ICD-10-CM

## 2019-08-18 DIAGNOSIS — Z6837 Body mass index (BMI) 37.0-37.9, adult: Secondary | ICD-10-CM

## 2019-08-18 DIAGNOSIS — Z9989 Dependence on other enabling machines and devices: Secondary | ICD-10-CM | POA: Diagnosis not present

## 2019-08-18 HISTORY — DX: Tobacco use: Z72.0

## 2019-08-18 HISTORY — DX: Obstructive sleep apnea (adult) (pediatric): G47.33

## 2019-08-18 HISTORY — DX: Gastrointestinal hemorrhage, unspecified: K92.2

## 2019-08-18 HISTORY — DX: Hyperlipidemia, unspecified: E78.5

## 2019-08-18 HISTORY — DX: Obesity, unspecified: E66.9

## 2019-08-18 HISTORY — DX: Essential (primary) hypertension: I10

## 2019-08-18 LAB — COMPREHENSIVE METABOLIC PANEL
ALT: 23 U/L (ref 0–44)
AST: 32 U/L (ref 15–41)
Albumin: 3.1 g/dL — ABNORMAL LOW (ref 3.5–5.0)
Alkaline Phosphatase: 66 U/L (ref 38–126)
Anion gap: 11 (ref 5–15)
BUN: 14 mg/dL (ref 8–23)
CO2: 22 mmol/L (ref 22–32)
Calcium: 8.4 mg/dL — ABNORMAL LOW (ref 8.9–10.3)
Chloride: 108 mmol/L (ref 98–111)
Creatinine, Ser: 0.95 mg/dL (ref 0.61–1.24)
GFR calc Af Amer: >60 mL/min (ref >60)
GFR calc non Af Amer: >60 mL/min (ref >60)
Glucose, Bld: 95 mg/dL (ref 70–99)
Potassium: 3.9 mmol/L (ref 3.5–5.1)
Sodium: 141 mmol/L (ref 135–145)
Total Bilirubin: 1.1 mg/dL (ref 0.3–1.2)
Total Protein: 7 g/dL (ref 6.5–8.1)

## 2019-08-18 LAB — CBC
HCT: 40.7 % (ref 39.0–52.0)
Hemoglobin: 13.2 g/dL (ref 13.0–17.0)
MCH: 28.4 pg (ref 26.0–34.0)
MCHC: 32.4 g/dL (ref 30.0–36.0)
MCV: 87.5 fL (ref 80.0–100.0)
Platelets: 161 10*3/uL (ref 150–400)
RBC: 4.65 MIL/uL (ref 4.22–5.81)
RDW: 17.2 % — ABNORMAL HIGH (ref 11.5–15.5)
WBC: 8 10*3/uL (ref 4.0–10.5)
nRBC: 0 % (ref 0.0–0.2)

## 2019-08-18 LAB — LIPID PANEL
Cholesterol: 104 mg/dL (ref 0–200)
HDL: 28 mg/dL — ABNORMAL LOW (ref >40)
LDL Cholesterol: 64 mg/dL (ref 0–99)
Total CHOL/HDL Ratio: 3.7 RATIO
Triglycerides: 62 mg/dL (ref <150)
VLDL: 12 mg/dL (ref 0–40)

## 2019-08-18 LAB — MAGNESIUM: Magnesium: 1.8 mg/dL (ref 1.7–2.4)

## 2019-08-18 LAB — HIV ANTIBODY (ROUTINE TESTING W REFLEX): HIV Screen 4th Generation wRfx: NONREACTIVE

## 2019-08-18 LAB — PROTIME-INR
INR: 1.3 — ABNORMAL HIGH (ref 0.8–1.2)
Prothrombin Time: 16.2 seconds — ABNORMAL HIGH (ref 11.4–15.2)

## 2019-08-18 LAB — TSH: TSH: 0.874 u[IU]/mL (ref 0.350–4.500)

## 2019-08-18 LAB — TROPONIN I (HIGH SENSITIVITY)
Troponin I (High Sensitivity): 893 ng/L (ref <18)
Troponin I (High Sensitivity): 869 ng/L (ref <18)

## 2019-08-18 MED ORDER — MELATONIN 3 MG PO TABS
6.0000 mg | ORAL_TABLET | Freq: Every day | ORAL | Status: DC
  Administered 2019-08-18 – 2019-08-19 (×2): 6 mg via ORAL
  Filled 2019-08-18 (×3): qty 2

## 2019-08-18 MED ORDER — CARVEDILOL 12.5 MG PO TABS
25.0000 mg | ORAL_TABLET | Freq: Two times a day (BID) | ORAL | Status: DC
  Filled 2019-08-18: qty 2

## 2019-08-18 MED ORDER — LACTATED RINGERS IV BOLUS
1000.0000 mL | Freq: Once | INTRAVENOUS | Status: AC
  Administered 2019-08-18: 19:00:00 1000 mL via INTRAVENOUS

## 2019-08-18 MED ORDER — ASPIRIN EC 81 MG PO TBEC
81.0000 mg | DELAYED_RELEASE_TABLET | Freq: Every day | ORAL | Status: DC
  Administered 2019-08-19 – 2019-08-20 (×2): 81 mg via ORAL
  Filled 2019-08-18 (×2): qty 1

## 2019-08-18 MED ORDER — SENNA 8.6 MG PO TABS
1.0000 | ORAL_TABLET | Freq: Two times a day (BID) | ORAL | Status: DC
  Administered 2019-08-18 – 2019-08-20 (×4): 8.6 mg via ORAL
  Filled 2019-08-18 (×4): qty 1

## 2019-08-18 MED ORDER — ASPIRIN 81 MG PO CHEW
324.0000 mg | CHEWABLE_TABLET | Freq: Once | ORAL | Status: AC
  Administered 2019-08-18: 324 mg via ORAL
  Filled 2019-08-18: qty 4

## 2019-08-18 MED ORDER — SODIUM CHLORIDE 0.9 % BOLUS PEDS: 250.0000 mL | Freq: Once | INTRAVENOUS | Status: DC

## 2019-08-18 MED ORDER — DILTIAZEM HCL 25 MG/5ML IV SOLN
10.0000 mg | Freq: Once | INTRAVENOUS | Status: AC
  Administered 2019-08-18: 10 mg via INTRAVENOUS
  Filled 2019-08-18: qty 5

## 2019-08-18 MED ORDER — LISINOPRIL 20 MG PO TABS: 40.0000 mg | ORAL_TABLET | Freq: Every day | ORAL | Status: DC

## 2019-08-18 MED ORDER — AMIODARONE LOAD VIA INFUSION
150.0000 mg | Freq: Once | INTRAVENOUS | Status: AC
  Administered 2019-08-18: 23:00:00 150 mg via INTRAVENOUS
  Filled 2019-08-18: qty 83.34

## 2019-08-18 MED ORDER — MAGNESIUM SULFATE 2 GM/50ML IV SOLN
2.0000 g | Freq: Once | INTRAVENOUS | Status: AC
  Administered 2019-08-18: 2 g via INTRAVENOUS
  Filled 2019-08-18: qty 50

## 2019-08-18 MED ORDER — AMIODARONE HCL IN DEXTROSE 360-4.14 MG/200ML-% IV SOLN
30.0000 mg/h | INTRAVENOUS | Status: DC
  Administered 2019-08-19 (×2): 30 mg/h via INTRAVENOUS
  Filled 2019-08-18 (×2): qty 200

## 2019-08-18 MED ORDER — RIVAROXABAN 20 MG PO TABS
20.0000 mg | ORAL_TABLET | Freq: Every day | ORAL | Status: DC
  Administered 2019-08-19 – 2019-08-20 (×2): 20 mg via ORAL
  Filled 2019-08-18 (×2): qty 1

## 2019-08-18 MED ORDER — ROSUVASTATIN CALCIUM 20 MG PO TABS
40.0000 mg | ORAL_TABLET | Freq: Every day | ORAL | Status: DC
  Administered 2019-08-18: 40 mg via ORAL
  Filled 2019-08-18: qty 2

## 2019-08-18 MED ORDER — NICOTINE 7 MG/24HR TD PT24
7.0000 mg | MEDICATED_PATCH | Freq: Every day | TRANSDERMAL | Status: DC
  Administered 2019-08-19 – 2019-08-20 (×2): 7 mg via TRANSDERMAL
  Filled 2019-08-18 (×4): qty 1

## 2019-08-18 MED ORDER — SODIUM CHLORIDE 0.9 % IV SOLN: INTRAVENOUS | Status: DC

## 2019-08-18 MED ORDER — AMIODARONE HCL IN DEXTROSE 360-4.14 MG/200ML-% IV SOLN
60.0000 mg/h | INTRAVENOUS | Status: AC
  Administered 2019-08-18: 60 mg/h via INTRAVENOUS
  Filled 2019-08-18: qty 200

## 2019-08-18 MED ORDER — PANTOPRAZOLE SODIUM 40 MG PO TBEC
40.0000 mg | DELAYED_RELEASE_TABLET | Freq: Every day | ORAL | Status: DC
  Administered 2019-08-18 – 2019-08-20 (×3): 40 mg via ORAL
  Filled 2019-08-18 (×3): qty 1

## 2019-08-18 NOTE — Consult Note (Addendum)
Admit date: 08/18/2019 Referring Physician  Dr. Pricilla Loveless Primary Physician  Dr. [name] Primary Cardiologist  Dr. Kristeen Miss Reason for Consultation  Atrial flutter with RVR and elevated troponin  HPI: Cameron Schultz is a 64 y.o. male who is being seen today for the evaluation of atrial flutter with RVr and elevated troponin at the request of Dr. Criss Alvine.  This is a 64yo AAM with a hx of PAF on Xarelto, HTN, HLD, OSA on CPAP but does not use it nightly, tobacco abuse, ASCAD s/p remote CABG in 2013 in [place] who is followed at the [facility] by cardiology but was seen by Dr. Elease Hashimoto here in 05/2019 for afib with RVR.  He apparently has had multiple cardioversions in the past including in the ER at [facility] 05/2018, outside hospital 11/2018 and then 05/2019 at [facility]  He continues to smoke 1ppd of cigarettes but denies any ETOH use.  He is on long term disability.  Typically when he gets afib he will develop palpitations and dizziness.    He presented to [facility] ER today with complaints of palpitations for the past 2 days.  He has a hx of medical noncompliance in the past and apparently missed 2 doses of Xarelto on Thursday and Friday.  He took his Xarelto today. He states that he has been SOB and lightheaded which are his typical symptoms when he is in atrial fibrillation.  He denies any chest pain or pressure, PND, orthopnea, LE edema, or syncope.  In ER he was noted to be in atrial flutter with variable block and intermittent RVR up to the 150's.  Unfortunately DCCV cannot be performed since he has not taken Xarelto for 2 days.  Pertinent labs in ER showed K+ 3.9, Na 141, Cr 0.95, Mag 1.8, hsTrop 893, Hbg 13.2.  EKG showed atrial flutter with variable block.  Cardiology is now asked to consult for help with atrial flutter management.      PMH:   Past Medical History:  Diagnosis Date  . Atrial fibrillation (HCC)   . Coronary artery disease   . Dysrhythmia    a fib  . Hypertension   . Myocardial  infarction (HCC)    unknown  . Sleep apnea      PSH:   Past Surgical History:  Procedure Laterality Date  . CARDIAC SURGERY    . CERVICAL FUSION    . CORONARY ARTERY BYPASS GRAFT  2013   1  . ESOPHAGOGASTRODUODENOSCOPY (EGD) WITH PROPOFOL N/A 12/21/2016   Procedure: ESOPHAGOGASTRODUODENOSCOPY (EGD) WITH PROPOFOL;  Surgeon: Kathi Der, MD;  Location: [facility] ENDOSCOPY;  Service: Gastroenterology;  Laterality: N/A;  . TONSILLECTOMY    . TOTAL HIP ARTHROPLASTY Left 02/22/2019   Procedure: TOTAL HIP ARTHROPLASTY ANTERIOR APPROACH;  Surgeon: Samson Frederic, MD;  Location: [facility];  Service: Orthopedics;  Laterality: Left;    Allergies:  Patient has no known allergies. Prior to Admit Meds:  (Not in a hospital admission)  Fam HX:    Family History  Problem Relation Age of Onset  . Heart attack Father        in his 40s   Social HX:    Social History   Socioeconomic History  . Marital status: Married    Spouse name: Not on file  . Number of children: Not on file  . Years of education: Not on file  . Highest education level: Not on file  Occupational History  . Not on file  Tobacco Use  . Smoking  status: Current Every Day Smoker    Packs/day: 0.25    Years: 15.00    Pack years: 3.75  . Smokeless tobacco: Never Used  Substance and Sexual Activity  . Alcohol use: No  . Drug use: No  . Sexual activity: Not on file  Other Topics Concern  . Not on file  Social History Narrative  . Not on file   Social Determinants of Health   Financial Resource Strain:   . Difficulty of Paying Living Expenses:   Food Insecurity:   . Worried About Charity fundraiser in the Last Year:   . Arboriculturist in the Last Year:   Transportation Needs:   . Film/video editor (Medical):   Marland Kitchen Lack of Transportation (Non-Medical):   Physical Activity:   . Days of Exercise per Week:   . Minutes of Exercise per Session:   Stress:   . Feeling of Stress :   Social Connections:   . Frequency  of Communication with Friends and Family:   . Frequency of Social Gatherings with Friends and Family:   . Attends Religious Services:   . Active Member of Clubs or Organizations:   . Attends Archivist Meetings:   Marland Kitchen Marital Status:   Intimate Partner Violence:   . Fear of Current or Ex-Partner:   . Emotionally Abused:   Marland Kitchen Physically Abused:   . Sexually Abused:      ROS:  All  ROS were addressed and are negative except what is stated in the HPI  Physical Exam: Blood pressure 108/68, pulse (!) 108, temperature 97.9 F (36.6 C), temperature source Oral, resp. rate (!) 22, height 6\' 2"  (1.88 m), weight 129.3 kg, SpO2 100 %.    General: Well developed, well nourished, in no acute distress Head: Eyes PERRLA, No xanthomas.   Normal cephalic and atramatic  Lungs:   Clear bilaterally to auscultation and percussion. Heart:   Irregularly irregu;ar and tachy S1 S2 Pulses are 2+ & equal.            No carotid bruit. No JVD.  No abdominal bruits. No femoral bruits. Abdomen: Bowel sounds are positive, abdomen soft and non-tender without masses or                  Hernia's noted. Msk:  Back normal, normal gait. Normal strength and tone for age. Extremities:   No clubbing, cyanosis or edema.  DP +1 Neuro: Alert and oriented X 3. Psych:  Good affect, responds appropriately    Labs:   Lab Results  Component Value Date   WBC 8.0 08/18/2019   HGB 13.2 08/18/2019   HCT 40.7 08/18/2019   MCV 87.5 08/18/2019   PLT 161 08/18/2019    Recent Labs  Lab 08/18/19 1751  NA 141  K 3.9  CL 108  CO2 22  BUN 14  CREATININE 0.95  CALCIUM 8.4*  PROT 7.0  BILITOT 1.1  ALKPHOS 66  ALT 23  AST 32  GLUCOSE 95   No results found for: PTT Lab Results  Component Value Date   INR 1.3 (H) 05/31/2019   INR 1.1 02/21/2019   INR 1.48 06/27/2018   Lab Results  Component Value Date   TROPONINI 0.03 ([place]) 03/02/2018    No results found for: CHOL No results found for: HDL No results  found for: LDLCALC No results found for: TRIG No results found for: CHOLHDL No results found for: LDLDIRECT    Radiology:  DG Chest Portable 1 View  Result Date: 08/18/2019 CLINICAL DATA:  Tachycardia EXAM: PORTABLE CHEST 1 VIEW COMPARISON:  05/31/2019 FINDINGS: Stable cardiomegaly. Prior median sternotomy. Mild pulmonary vascular congestion. No focal airspace consolidation, pleural effusion, or pneumothorax. The visualized skeletal structures are unremarkable. IMPRESSION: Cardiomegaly and mild pulmonary vascular congestion without frank edema or focal consolidation. Electronically Signed   By: Duanne Guess D.O.   On: 08/18/2019 19:24     Telemetry    Atrial flutter with variable block  - Personally Reviewed  ECG    Atrial flutter with variable block and nonspecific ST abnormality - Personally Reviewed   ASSESSMENT/PLAN:    1.  New onset atrial flutter with RVR -he has a hx of PAF with multiple DCCVs in the past year  -he has hx also of medical noncompliance missing doses of DOAC, BB and CCB -now presents with SOB and lightheadedness which are his typical symptoms when he is out of rhythm -EKG shows atrial flutter with RVR up to the 150's -he has been started on Cardizem gtt but BP too soft so it was stopped -will place back on Xarelto 20mg  daily - he took his dose today -hold BB for now due to soft BP -start IV Amio gtt after fluid bolus to try to get HR controlled -check TSH  2.  HTN -BP borderline soft -hold carvedilol and Lisinopril due to soft BP for now  3.  ASCAD  -s/p remote CABG -denies any angina -continue ASA and statin -no BB due to soft BP  4.   Elevated troponin -hsTrop elevated at 893 which is higher than it has been on other admission for afib with RVR -still likely related to demand ischemia in the setting of aflutter with RVR -he has no sx of angina -he has had SOB but that is his typical sx when he gets arrhythmias -check 2D echo to assess LVF  and wall motion -he is on Xarelto but has missed 2 doses - restarted today  5.  HLD -LDL goal < 70 -continue Crestor 40mg  daily  , MD  08/18/2019  7:47 PM

## 2019-08-18 NOTE — ED Notes (Signed)
Dr. Criss Alvine notified of pt Troponin.

## 2019-08-18 NOTE — ED Notes (Signed)
Pharmacy messaged twice in regards to the pt missing amiodarone dose.

## 2019-08-18 NOTE — ED Triage Notes (Signed)
Pt arrived by POV with complaint of tachycardia that started 2 days ago. Pt states he has Afib and hasn't taken his medications for 2 days. Pt endorses SOB and lightheadedness. Pt denies chest pain.

## 2019-08-18 NOTE — H&P (Signed)
[facility] History and Physical  Brandonn Capelli MMH:[id number] DOB: [birth year] DOA: 08/18/2019  Referring ER provider: Katharine Look, MD  PCP: [name], MD   Chief Complaint: Palpitations  HPI: Cameron Schultz is a 64 y.o. male with PMH PAF on Xarelto, HTN, HLD, OSA, tobacco abuse, CAD s/p CABG in 2013 who presented to ER with palpitations and admitted for Afib with RVR. Patient reports palpitations since 3/18. He had been out of town in [place] visiting family and missed two doses of Xarelto. Patient denies history of drug use for many years but did happen to use cocaine on Wednesday. Reports some dizziness, lightheadedness and shortness of breath but otherwise denies any other complaints at this time. Reports that generally when he has episodes of a fib his heart rate will go up and down but this time it has stayed elevated. Denies any pain. Denies alcohol use. Current smoker. He has not eaten all day and is hungry. Denies fever, chills, cough, chest pain, abdominal pain, nausea, vomiting, diarrhea, constipation, dysuria, hematuria, hematochezia, melena, difficulty moving arms/legs, speech difficulty, trouble eating, confusion or any other complaints.  In the ED: HR elevated to 140-150's and EKG with a flutter. Vitals otherwise stable. CBC, CMP and Mag WNL. Trop elevated to 893. Cardiology consulted and reported patient not a candidate for cardioversion due to missing two doses of Xarelto. Admission for further rate/rhythm control.  CXR without acute cardiopulmonary abnormality. Cardiomegaly noted.  Patient was given 10 mg IV diltiazem.  Review of Systems:  All other systems negative unless noted above in HPI.   Past Medical History:  Diagnosis Date  . Acute upper GI bleed   . Atrial fibrillation (HCC)   . Coronary artery disease   . Dysrhythmia    a fib  . Hypertension   . Myocardial infarction (HCC)    unknown  . Sleep apnea    Past Surgical History:  Procedure Laterality  Date  . CARDIAC SURGERY    . CERVICAL FUSION    . CORONARY ARTERY BYPASS GRAFT  2013   1  . ESOPHAGOGASTRODUODENOSCOPY (EGD) WITH PROPOFOL N/A 12/21/2016   Procedure: ESOPHAGOGASTRODUODENOSCOPY (EGD) WITH PROPOFOL;  Surgeon: Kathi Der, MD;  Location: [facility];  Service: Gastroenterology;  Laterality: N/A;  . TONSILLECTOMY    . TOTAL HIP ARTHROPLASTY Left 02/22/2019   Procedure: TOTAL HIP ARTHROPLASTY ANTERIOR APPROACH;  Surgeon: Samson Frederic, MD;  Location: [facility];  Service: Orthopedics;  Laterality: Left;   Social History:  reports that he has been smoking. He has a 3.75 pack-year smoking history. He has never used smokeless tobacco. He reports that he does not drink alcohol or use drugs.  No Known Allergies  Family History  Problem Relation Age of Onset  . Heart attack Father        in his 35s    Prior to Admission medications   Medication Sig Start Date End Date Taking? Authorizing Provider  Ascorbic Acid (VITAMIN C PO) Take 1 tablet by mouth daily.   Yes [provider]  aspirin EC 81 MG tablet Take 81 mg by mouth daily.   Yes [provider]  carvedilol (COREG) 25 MG tablet Take 25 mg by mouth 2 (two) times daily with a meal.    Yes [provider]  diltiazem (CARDIZEM CD) 120 MG 24 hr capsule Take 1 capsule (120 mg total) by mouth daily. 03/02/18  Yes Raeford Razor, MD  docusate sodium (COLACE) 100 MG capsule Take 1 capsule (100 mg total) by mouth 2 (  two) times daily. 02/23/19  Yes Swinteck, Arlys John, MD  lisinopril (PRINIVIL,ZESTRIL) 40 MG tablet Take 40 mg by mouth daily.   Yes [provider]  Melatonin 3 MG CAPS Take 6 mg by mouth at bedtime.   Yes [provider]  ondansetron (ZOFRAN) 4 MG tablet Take 1 tablet (4 mg total) by mouth every 6 (six) hours as needed for nausea. 02/23/19  Yes Swinteck, Arlys John, MD  pantoprazole (PROTONIX) 40 MG tablet Take 1 tablet (40 mg total) by mouth daily. 12/23/16  Yes Marquette Saa, MD  rivaroxaban (XARELTO) 20 MG TABS tablet Take 20 mg by mouth daily with breakfast.    Yes [provider]  rosuvastatin (CRESTOR) 40 MG tablet Take 40 mg by mouth at bedtime.   Yes [provider]  senna (SENOKOT) 8.6 MG TABS tablet Take 1 tablet (8.6 mg total) by mouth 2 (two) times daily. 02/23/19  Yes Swinteck, Arlys John, MD  HYDROcodone-acetaminophen (NORCO) 7.5-325 MG tablet Take 1 tablet by mouth every 4 (four) hours as needed for severe pain (pain score 7-10). Patient not taking: Reported on 08/18/2019 02/23/19   Samson Frederic, MD   Physical Exam: Vitals:   08/18/19 2145 08/18/19 2200 08/18/19 2215 08/18/19 2230  BP: 136/89 140/85 (!) 112/92 113/82  Pulse: 84 73 85 80  Resp: (!) 25 18 (!) 21 19  Temp:      TempSrc:      SpO2: 98% 99% 97% 97%  Weight:      Height:        Wt Readings from Last 3 Encounters:  08/18/19 129.3 kg  05/31/19 131.5 kg  02/22/19 128.8 kg    General:  Appears calm and comfortable. AAOx4 Eyes: EOMI, normal lids, irises & conjunctiva ENT: grossly normal hearing Neck: normal ROM Cardiovascular: Tachycardic with irregularly irregular rhythm, no m/r/g. No LE edema. Respiratory: CTA bilaterally, no w/r/r. Normal respiratory effort. Abdomen: soft, ntnd Skin: no rash or induration seen on limited exam Musculoskeletal: grossly normal tone BUE/BLE Psychiatric: grossly normal mood and affect, speech fluent and appropriate Neurologic: grossly non-focal.          Labs on Admission:  Basic Metabolic Panel: Recent Labs  Lab 08/18/19 1751  NA 141  K 3.9  CL 108  CO2 22  GLUCOSE 95  BUN 14  CREATININE 0.95  CALCIUM 8.4*  MG 1.8   Liver Function Tests: Recent Labs  Lab 08/18/19 1751  AST 32  ALT 23  ALKPHOS 66  BILITOT 1.1  PROT 7.0  ALBUMIN 3.1*   No results for input(s): LIPASE, AMYLASE in the last 168 hours. No results for input(s): AMMONIA in the last 168 hours. CBC: Recent Labs  Lab 08/18/19 1751  WBC 8.0   HGB 13.2  HCT 40.7  MCV 87.5  PLT 161   Cardiac Enzymes: No results for input(s): CKTOTAL, CKMB, CKMBINDEX, TROPONINI in the last 168 hours.  BNP (last 3 results) No results for input(s): BNP in the last 8760 hours.  ProBNP (last 3 results) No results for input(s): PROBNP in the last 8760 hours.  CBG: No results for input(s): GLUCAP in the last 168 hours.  Radiological Exams on Admission: DG Chest Portable 1 View  Result Date: 08/18/2019 CLINICAL DATA:  Tachycardia EXAM: PORTABLE CHEST 1 VIEW COMPARISON:  05/31/2019 FINDINGS: Stable cardiomegaly. Prior median sternotomy. Mild pulmonary vascular congestion. No focal airspace consolidation, pleural effusion, or pneumothorax. The visualized skeletal structures are unremarkable. IMPRESSION: Cardiomegaly and mild pulmonary vascular congestion without frank edema  or focal consolidation. Electronically Signed   By: Davina Poke D.O.   On: 08/18/2019 19:24    EKG: Independently reviewed. HR 110, Atrial flutter, QTc 548. No STEMI.  Assessment/Plan Principal Problem:   Atrial fibrillation with RVR (HCC) Active Problems:   Chronic anticoagulation   Essential hypertension   Hyperlipidemia   OSA on CPAP   Obesity (BMI 30-39.9)   Tobacco abuse   CAD (coronary artery disease)  64 y.o. male with PMH PAF on Xarelto, HTN, HLD, OSA, tobacco abuse, CAD s/p CABG in 2013 who presented to ER with palpitations and admitted for Afib with RVR.  Atrial flutter with RVR Elevated Troponin Chronic AC - s/p Diltiazem 10 mg in ED; will give another IV dose as needed and then start GTT if rate not better controlled - Cardiology consulted and will follow: cont Xarelto, check TSH, Amiodarone GTT ordered, Echo, hold BP meds - Echo ordered - TSH in process - Restart PO meds as able per Cardiology recs - Elevated trop likely secondary to demand ischemia in setting of irregular rhythm, but will trend trops - Tele   HTN, CAD, HLD - Hold BP meds due  to borderline BP's - restart Carvedilol and Lisinopril as able  - cont ASA and statin - Lipid panel ordered  - LDL goal < 70  OSA - patient reports he does not use CPAP every night - will order CPAP as needed   Tobacco Abuse - Smoke 1/4 ppd - Nicotine patch ordered   Code Status: Full code; discussed with patient and wife  DVT Prophylaxis: Xarelto Family Communication: Wife at bedside Disposition Plan: Admit to [facility] unit with Tele. Patient is at high risk for further decompensation due to age and co-morbidities. Patient may require Cardizem GTT and therefore admitted to stepdown unit. Cardiology consulted in ED and following.   Time spent: 65 minutes  Chauncey Mann, MD [facility] Pager [phone]

## 2019-08-18 NOTE — Progress Notes (Signed)
  Amiodarone Drug - Drug Interaction Consult Note  Recommendations: Monitor electrolytes and EKG.  Amiodarone is metabolized by the cytochrome P450 system and therefore has the potential to cause many drug interactions. Amiodarone has an average plasma half-life of 50 days (range 20 to 100 days).   There is potential for drug interactions to occur several weeks or months after stopping treatment and the onset of drug interactions may be slow after initiating amiodarone.   [x]  Statins: Increased risk of myopathy. Simvastatin- restrict dose to 20mg  daily. Other statins: counsel patients to report any muscle pain or weakness immediately.  []  Anticoagulants: Amiodarone can increase anticoagulant effect. Consider warfarin dose reduction. Patients should be monitored closely and the dose of anticoagulant altered accordingly, remembering that amiodarone levels take several weeks to stabilize.  []  Antiepileptics: Amiodarone can increase plasma concentration of phenytoin, the dose should be reduced. Note that small changes in phenytoin dose can result in large changes in levels. Monitor patient and counsel on signs of toxicity.  []  Beta blockers: increased risk of bradycardia, AV block and myocardial depression. Sotalol - avoid concomitant use.  [x]   Calcium channel blockers (diltiazem and verapamil): increased risk of bradycardia, AV block and myocardial depression.  []   Cyclosporine: Amiodarone increases levels of cyclosporine. Reduced dose of cyclosporine is recommended.  []  Digoxin dose should be halved when amiodarone is started.  []  Diuretics: increased risk of cardiotoxicity if hypokalemia occurs.  []  Oral hypoglycemic agents (glyburide, glipizide, glimepiride): increased risk of hypoglycemia. Patient's glucose levels should be monitored closely when initiating amiodarone therapy.   []  Drugs that prolong the QT interval:  Torsades de pointes risk may be increased with concurrent use - avoid  if possible.  Monitor QTc, also keep magnesium/potassium WNL if concurrent therapy can't be avoided. Antibiotics: e.g. fluoroquinolones, erythromycin. . Antiarrhythmics: e.g. quinidine, procainamide, disopyramide, sotalol. . Antipsychotics: e.g. phenothiazines, haloperidol.  . Lithium, tricyclic antidepressants, and methadone. Thank You,  [place]  08/18/2019 10:33 PM

## 2019-08-18 NOTE — ED Provider Notes (Signed)
[facility] EMERGENCY DEPARTMENT Provider Note   CSN: [id number] Arrival date & time: 08/18/19  1732     History Chief Complaint  Patient presents with  . Tachycardia    Cameron Schultz is a 64 y.o. male.  HPI Patient is a 64 year old male with a PMH of CABG in 2013, hyperlipidemia, A. fib/a flutter (on Xarelto) and hypertension presenting to the ED today due to palpitations.  Patient reports that he first began to notice the palpitations 2 days ago but does not remember the exact time.  He says that they have been pretty constant since they began.  He has had occasional episodes of lightheadedness and shortness of breath.  He denies any chest pain.  Patient took his Xarelto this morning but missed his 2 doses prior to this.  Patient endorses using crack cocaine the day before his palpitations began.  He denies fever, chills, nausea, vomiting, diarrhea, cough or congestion.    Past Medical History:  Diagnosis Date  . Atrial fibrillation (HCC)   . Coronary artery disease   . Dysrhythmia    a fib  . Hypertension   . Myocardial infarction (HCC)    unknown  . Sleep apnea     Patient Active Problem List   Diagnosis Date Noted  . Avascular necrosis of bone of left hip (HCC) 02/22/2019  . Avascular necrosis of hip, left (HCC) 02/22/2019  . GI bleed 12/20/2016  . Acute upper GI bleed   . Chronic anticoagulation     Past Surgical History:  Procedure Laterality Date  . CARDIAC SURGERY    . CERVICAL FUSION    . CORONARY ARTERY BYPASS GRAFT  2013   1  . ESOPHAGOGASTRODUODENOSCOPY (EGD) WITH PROPOFOL N/A 12/21/2016   Procedure: ESOPHAGOGASTRODUODENOSCOPY (EGD) WITH PROPOFOL;  Surgeon: Kathi Der, MD;  Location: [facility];  Service: Gastroenterology;  Laterality: N/A;  . TONSILLECTOMY    . TOTAL HIP ARTHROPLASTY Left 02/22/2019   Procedure: TOTAL HIP ARTHROPLASTY ANTERIOR APPROACH;  Surgeon: Samson Frederic, MD;  Location: [facility];  Service: Orthopedics;   Laterality: Left;       Family History  Problem Relation Age of Onset  . Heart attack Father        in his 50s    Social History   Tobacco Use  . Smoking status: Current Every Day Smoker    Packs/day: 0.25    Years: 15.00    Pack years: 3.75  . Smokeless tobacco: Never Used  Substance Use Topics  . Alcohol use: No  . Drug use: No    Home Medications Prior to Admission medications   Medication Sig Start Date End Date Taking? Authorizing Provider  Ascorbic Acid (VITAMIN C PO) Take 1 tablet by mouth daily.    [provider]  aspirin EC 81 MG tablet Take 81 mg by mouth daily.    [provider]  carvedilol (COREG) 25 MG tablet Take 25 mg by mouth 2 (two) times daily with a meal.     [provider]  diltiazem (CARDIZEM CD) 120 MG 24 hr capsule Take 1 capsule (120 mg total) by mouth daily. 03/02/18   Raeford Razor, MD  docusate sodium (COLACE) 100 MG capsule Take 1 capsule (100 mg total) by mouth 2 (two) times daily. 02/23/19   Swinteck, Arlys John, MD  HYDROcodone-acetaminophen (NORCO) 7.5-325 MG tablet Take 1 tablet by mouth every 4 (four) hours as needed for severe pain (pain score 7-10). 02/23/19   Swinteck, Arlys John, MD  lisinopril (  PRINIVIL,ZESTRIL) 40 MG tablet Take 40 mg by mouth daily.    [provider]  Melatonin 3 MG CAPS Take 6 mg by mouth at bedtime.    [provider]  ondansetron (ZOFRAN) 4 MG tablet Take 1 tablet (4 mg total) by mouth every 6 (six) hours as needed for nausea. 02/23/19   Swinteck, Aaron Edelman, MD  pantoprazole (PROTONIX) 40 MG tablet Take 1 tablet (40 mg total) by mouth daily. Patient taking differently: Take 40 mg by mouth 2 (two) times daily.  12/23/16   Verner Mould, MD  PRESCRIPTION MEDICATION Inhale into the lungs at bedtime. CPAP    [provider]  rivaroxaban (XARELTO) 20 MG TABS tablet Take 20 mg by mouth daily with breakfast.     [provider]  rosuvastatin (CRESTOR) 40 MG tablet  Take 40 mg by mouth at bedtime.    [provider]  senna (SENOKOT) 8.6 MG TABS tablet Take 1 tablet (8.6 mg total) by mouth 2 (two) times daily. 02/23/19   Rod Can, MD    Allergies    Patient has no known allergies.  Review of Systems   Review of Systems  Constitutional: Negative for chills and fever.  HENT: Negative for rhinorrhea and sore throat.   Eyes: Negative for photophobia and visual disturbance.  Respiratory: Negative for cough and shortness of breath.   Cardiovascular: Positive for palpitations. Negative for chest pain and leg swelling.  Gastrointestinal: Negative for abdominal pain, diarrhea, nausea and vomiting.  Genitourinary: Negative for dysuria and hematuria.  Musculoskeletal: Negative for arthralgias and back pain.  Skin: Negative for rash and wound.  Neurological: Negative for seizures, syncope, weakness and light-headedness.  Psychiatric/Behavioral: Negative for agitation.  All other systems reviewed and are negative.   Physical Exam Updated Vital Signs BP (!) 128/11 (BP Location: Right Arm)   Pulse (!) 156   Temp 97.9 F (36.6 C) (Oral)   Resp 18   SpO2 99%   Physical Exam Vitals and nursing note reviewed.  Constitutional:      General: He is not in acute distress.    Appearance: Normal appearance. He is well-developed. He is not ill-appearing.  HENT:     Head: Normocephalic and atraumatic.     Right Ear: External ear normal.     Left Ear: External ear normal.     Nose: Nose normal. No congestion or rhinorrhea.     Mouth/Throat:     Mouth: Mucous membranes are moist.     Pharynx: Oropharynx is clear.  Eyes:     Extraocular Movements: Extraocular movements intact.     Pupils: Pupils are equal, round, and reactive to light.  Cardiovascular:     Rate and Rhythm: Regular rhythm. Tachycardia present.     Pulses: Normal pulses.     Heart sounds: Normal heart sounds.  Pulmonary:     Effort: Pulmonary effort is normal. No respiratory  distress.     Breath sounds: Normal breath sounds. No stridor. No wheezing, rhonchi or rales.  Chest:     Chest wall: No tenderness.  Abdominal:     General: There is no distension.     Palpations: Abdomen is soft.     Tenderness: There is no abdominal tenderness. There is no guarding or rebound.  Musculoskeletal:        General: Normal range of motion.     Cervical back: Normal range of motion and neck supple.     Right lower leg: No edema.  Left lower leg: No edema.  Skin:    General: Skin is warm and dry.     Capillary Refill: Capillary refill takes less than 2 seconds.  Neurological:     General: No focal deficit present.     Mental Status: He is alert and oriented to person, place, and time. Mental status is at baseline.  Psychiatric:        Mood and Affect: Mood normal.     ED Results / Procedures / Treatments   Labs (all labs ordered are listed, but only abnormal results are displayed) Labs Reviewed  CBC - Abnormal; Notable for the following components:      Result Value   RDW 17.2 (*)    All other components within normal limits  COMPREHENSIVE METABOLIC PANEL - Abnormal; Notable for the following components:   Calcium 8.4 (*)    Albumin 3.1 (*)    All other components within normal limits  PROTIME-INR - Abnormal; Notable for the following components:   Prothrombin Time 16.2 (*)    INR 1.3 (*)    All other components within normal limits  LIPID PANEL - Abnormal; Notable for the following components:   HDL 28 (*)    All other components within normal limits  TROPONIN I (HIGH SENSITIVITY) - Abnormal; Notable for the following components:   Troponin I (High Sensitivity) 893 (*)    All other components within normal limits  TROPONIN I (HIGH SENSITIVITY) - Abnormal; Notable for the following components:   Troponin I (High Sensitivity) 869 (*)    All other components within normal limits  SARS CORONAVIRUS 2 (TAT 6-24 HRS)  MAGNESIUM  TSH  HIV ANTIBODY (ROUTINE  TESTING W REFLEX)  COMPREHENSIVE METABOLIC PANEL  CBC  MAGNESIUM  TROPONIN I (HIGH SENSITIVITY)    EKG EKG Interpretation  Date/Time:  Saturday August 18 2019 18:32:28 EDT Ventricular Rate:  110 PR Interval:    QRS Duration: 98 QT Interval:  478 QTC Calculation: 548 R Axis:   -22 Text Interpretation: Atrial flutter Borderline left axis deviation Low voltage, precordial leads Abnormal R-wave progression, early transition Nonspecific repol abnormality, diffuse leads Prolonged QT interval rate is slower compared to earlier in the day Confirmed by Pricilla Loveless [phone]) on 08/18/2019 6:54:07 PM   Radiology DG Chest Portable 1 View  Result Date: 08/18/2019 CLINICAL DATA:  Tachycardia EXAM: PORTABLE CHEST 1 VIEW COMPARISON:  05/31/2019 FINDINGS: Stable cardiomegaly. Prior median sternotomy. Mild pulmonary vascular congestion. No focal airspace consolidation, pleural effusion, or pneumothorax. The visualized skeletal structures are unremarkable. IMPRESSION: Cardiomegaly and mild pulmonary vascular congestion without frank edema or focal consolidation. Electronically Signed   By: Duanne Guess D.O.   On: 08/18/2019 19:24    Procedures Procedures (including critical care time)  Medications Ordered in ED Medications  aspirin EC tablet 81 mg (81 mg Oral Not Given 08/18/19 2214)  rosuvastatin (CRESTOR) tablet 40 mg (40 mg Oral Given 08/18/19 2216)  pantoprazole (PROTONIX) EC tablet 40 mg (40 mg Oral Given 08/18/19 2215)  senna (SENOKOT) tablet 8.6 mg (8.6 mg Oral Given 08/18/19 2215)  rivaroxaban (XARELTO) tablet 20 mg (has no administration in time range)  Melatonin TABS 6 mg (6 mg Oral Given 08/18/19 2330)  amiodarone (NEXTERONE PREMIX) 360-4.14 MG/200ML-% (1.8 mg/mL) IV infusion (60 mg/hr Intravenous New Bag/Given 08/18/19 2329)    Followed by  amiodarone (NEXTERONE PREMIX) 360-4.14 MG/200ML-% (1.8 mg/mL) IV infusion (has no administration in time range)  nicotine (NICODERM CQ - dosed in  mg/24 hr) patch  7 mg (has no administration in time range)  lactated ringers bolus 1,000 mL (0 mLs Intravenous Stopped 08/18/19 2032)  aspirin chewable tablet 324 mg (324 mg Oral Given 08/18/19 1910)  diltiazem (CARDIZEM) injection 10 mg (10 mg Intravenous Given 08/18/19 2033)  magnesium sulfate IVPB 2 g 50 mL (0 g Intravenous Stopped 08/18/19 2256)  amiodarone (NEXTERONE) 1.8 mg/mL load via infusion 150 mg (150 mg Intravenous Bolus from Bag 08/18/19 2328)    ED Course  I have reviewed the triage vital signs and the nursing notes.  Pertinent labs & imaging results that were available during my care of the patient were reviewed by me and considered in my medical decision making (see chart for details).    MDM Rules/Calculators/A&P                     Patient is a 64 year old male with a PMH of CABG in 2013, hyperlipidemia, A. fib/a flutter and hypertension presenting to the ED today due to palpitations.  Physical exam unremarkable.  BP 90/69, HR 156, RR 30, SPO2 98% on room air.  Afebrile.  On arrival, patient appears generally well and is displaying no signs of acute distress.  He continues to experience palpitations but denies any current chest pain, shortness of breath or lightheadedness.  He says that the palpitations have been pretty persistent over the past 2 days.  He used crack cocaine 3 days ago but denies any other drug use since that time.  Patient took his dose of Xarelto this morning but missed his 2 prior doses.  EKG consistent with atrial flutter with 2:1 conduction.  When observing his monitor, his heart rate will briefly become irregular appearing more consistent with atrial fibrillation.  However, it returns to atrial flutter pattern with rate in the 150s after short period of time.  He did have 1 brief episode of atrial flutter with a rate in the 80s and patient said that he no longer felt the palpitations.  However, his heart rate returned to the 150s within a few minutes.  His BP is  soft but has remained stable and he continues to remain asymptomatic other than the feeling of palpitations.  CBC unremarkable.  CMP with calcium 8.4, albumin 3.1.  Mag 1.8.  Initial troponin 893.  Patient continues to deny chest pain.  After discussion with cardiology attending on call, patient would not be a candidate for cardioversion as he has missed his most recent doses of anticoagulant.  Given his significant troponinemia, he will need to be admitted to hospitalist service.  Patient given 10 mg IV diltiazem with mild improvement in his heart rate.  At this time, patient admitted to hospitalist service.  For details on hospitalization following admission, please refer to inpatient team's note.  Patient stable at time of admission.  Patient assessed and evaluated with Dr. Criss Alvine.  Delray Alt, MD     Final Clinical Impression(s) / ED Diagnoses Final diagnoses:  Atrial flutter, unspecified type [facility])    Rx / DC Orders ED Discharge Orders    None       Delray Alt, MD 08/19/19 Quentin Cornwall, MD 08/19/19 [phone]

## 2019-08-19 DIAGNOSIS — E785 Hyperlipidemia, unspecified: Secondary | ICD-10-CM

## 2019-08-19 DIAGNOSIS — E669 Obesity, unspecified: Secondary | ICD-10-CM

## 2019-08-19 DIAGNOSIS — Z9989 Dependence on other enabling machines and devices: Secondary | ICD-10-CM

## 2019-08-19 DIAGNOSIS — Z720 Tobacco use: Secondary | ICD-10-CM

## 2019-08-19 DIAGNOSIS — G4733 Obstructive sleep apnea (adult) (pediatric): Secondary | ICD-10-CM

## 2019-08-19 DIAGNOSIS — I4891 Unspecified atrial fibrillation: Secondary | ICD-10-CM

## 2019-08-19 DIAGNOSIS — I10 Essential (primary) hypertension: Secondary | ICD-10-CM

## 2019-08-19 DIAGNOSIS — I2510 Atherosclerotic heart disease of native coronary artery without angina pectoris: Secondary | ICD-10-CM

## 2019-08-19 LAB — MAGNESIUM: Magnesium: 2 mg/dL (ref 1.7–2.4)

## 2019-08-19 LAB — COMPREHENSIVE METABOLIC PANEL
ALT: 28 U/L (ref 0–44)
AST: 35 U/L (ref 15–41)
Albumin: 2.8 g/dL — ABNORMAL LOW (ref 3.5–5.0)
Alkaline Phosphatase: 59 U/L (ref 38–126)
Anion gap: 10 (ref 5–15)
BUN: 12 mg/dL (ref 8–23)
CO2: 22 mmol/L (ref 22–32)
Calcium: 8.1 mg/dL — ABNORMAL LOW (ref 8.9–10.3)
Chloride: 107 mmol/L (ref 98–111)
Creatinine, Ser: 0.89 mg/dL (ref 0.61–1.24)
GFR calc Af Amer: >60 mL/min (ref >60)
GFR calc non Af Amer: >60 mL/min (ref >60)
Glucose, Bld: 130 mg/dL — ABNORMAL HIGH (ref 70–99)
Potassium: 3.5 mmol/L (ref 3.5–5.1)
Sodium: 139 mmol/L (ref 135–145)
Total Bilirubin: 0.8 mg/dL (ref 0.3–1.2)
Total Protein: 6.3 g/dL — ABNORMAL LOW (ref 6.5–8.1)

## 2019-08-19 LAB — CBC
HCT: 39.1 % (ref 39.0–52.0)
Hemoglobin: 12.4 g/dL — ABNORMAL LOW (ref 13.0–17.0)
MCH: 28 pg (ref 26.0–34.0)
MCHC: 31.7 g/dL (ref 30.0–36.0)
MCV: 88.3 fL (ref 80.0–100.0)
Platelets: 141 10*3/uL — ABNORMAL LOW (ref 150–400)
RBC: 4.43 MIL/uL (ref 4.22–5.81)
RDW: 17.2 % — ABNORMAL HIGH (ref 11.5–15.5)
WBC: 5.9 10*3/uL (ref 4.0–10.5)
nRBC: 0 % (ref 0.0–0.2)

## 2019-08-19 LAB — TROPONIN I (HIGH SENSITIVITY)
Troponin I (High Sensitivity): 997 ng/L (ref <18)
Troponin I (High Sensitivity): 750 ng/L (ref <18)
Troponin I (High Sensitivity): 447 ng/L (ref <18)

## 2019-08-19 LAB — SARS CORONAVIRUS 2 (TAT 6-24 HRS): SARS Coronavirus 2: NEGATIVE

## 2019-08-19 LAB — POTASSIUM: Potassium: 4.2 mmol/L (ref 3.5–5.1)

## 2019-08-19 MED ORDER — ACETAMINOPHEN 500 MG PO TABS
1000.0000 mg | ORAL_TABLET | Freq: Three times a day (TID) | ORAL | Status: DC | PRN
  Administered 2019-08-19 (×2): 1000 mg via ORAL
  Filled 2019-08-19 (×2): qty 2

## 2019-08-19 MED ORDER — ROSUVASTATIN CALCIUM 20 MG PO TABS
20.0000 mg | ORAL_TABLET | Freq: Every day | ORAL | Status: DC
  Administered 2019-08-19: 23:00:00 20 mg via ORAL
  Filled 2019-08-19: qty 1

## 2019-08-19 MED ORDER — AMIODARONE HCL 200 MG PO TABS
400.0000 mg | ORAL_TABLET | Freq: Two times a day (BID) | ORAL | Status: DC
  Administered 2019-08-19 – 2019-08-20 (×2): 400 mg via ORAL
  Filled 2019-08-19 (×2): qty 2

## 2019-08-19 MED ORDER — POTASSIUM CHLORIDE CRYS ER 20 MEQ PO TBCR
40.0000 meq | EXTENDED_RELEASE_TABLET | Freq: Once | ORAL | Status: AC
  Administered 2019-08-19: 40 meq via ORAL
  Filled 2019-08-19: qty 2

## 2019-08-19 NOTE — Progress Notes (Signed)
Will repeat EKG and potassium level per MD

## 2019-08-19 NOTE — Progress Notes (Addendum)
PROGRESS NOTE    Cameron Schultz  HQP:[id number] DOB: 29-Jan-1956 DOA: 08/18/2019 PCP: Windy Fast, MD    Brief Narrative:  HPI per Dr. Cy Blamer Phenix is a 64 y.o. male with PMH PAF on Xarelto, HTN, HLD, OSA, tobacco abuse, CAD s/p CABG in 2013 who presented to ER with palpitations and admitted for Afib with RVR. Patient reported palpitations since 3/18. He had been out of town in [place] visiting family and missed two doses of Xarelto. Patient denied history of drug use for many years but did happen to use cocaine on Wednesday. Reported some dizziness, lightheadedness and shortness of breath but otherwise denies any other complaints at this time. Reported that generally when he has episodes of a fib his heart rate will go up and down but this time it has stayed elevated. Denied any pain. Denied alcohol use. Current smoker. He had not eaten all day and is hungry. Denies fever, chills, cough, chest pain, abdominal pain, nausea, vomiting, diarrhea, constipation, dysuria, hematuria, hematochezia, melena, difficulty moving arms/legs, speech difficulty, trouble eating, confusion or any other complaints.  In the ED: HR elevated to 140-150's and EKG with a flutter. Vitals otherwise stable. CBC, CMP and Mag WNL. Trop elevated to 893. Cardiology consulted and reported patient not a candidate for cardioversion due to missing two doses of Xarelto. Admission for further rate/rhythm control.  CXR without acute cardiopulmonary abnormality. Cardiomegaly noted.  Patient was given 10 mg IV diltiazem.   Assessment & Plan:   Principal Problem:   Atrial fibrillation with RVR (HCC) Active Problems:   Chronic anticoagulation   Essential hypertension   Hyperlipidemia   OSA on CPAP   Obesity (BMI 30-39.9)   Tobacco abuse   CAD (coronary artery disease)  1 A. fib with RVR Patient had presented and noted to be in A. fib.  Patient stated when out of town and forgot to take his medications with him and as  such had not been on his medications for a few days.  Troponins elevated at 869 >>>997>>>750.  TSH within normal limits at 0.874.  2D echo pending patient received 10 mg IV diltiazem in the ED.  Patient subsequently placed on IV amiodarone drip secondary to borderline blood pressure.  Patient seems to have converted to normal sinus rhythm and currently still on amiodarone drip.  Could likely transition back to home regimen of Cardizem and beta-blocker versus oral amiodarone however will defer to cardiology.  Continue Xarelto for anticoagulation.  Patient seen in consultation by cardiology who are following.  2 elevated troponin Felt secondary to demand ischemia per cardiology secondary to problem #1.  Patient denies any ongoing chest pain.  2D echo pending.  Cardiology following.  3.  Hyperlipidemia Decrease statin to 20 mg daily due to probable drug drug interaction with amiodarone.  4.  Tobacco abuse Tobacco cessation.  Nicotine patch.  5.  OSA CPAP nightly.  6.  Hypertension Blood pressure noted to be borderline on admission and as such Coreg and lisinopril on hold.  Blood pressure stable.  Patient currently on amiodarone drip.  7.  Coronary artery disease status post CABG Currently stable.  Continue statin, aspirin.  Patient's beta-blocker and ACE inhibitor held due to borderline blood pressure and patient in A. fib.  Patient currently on amiodarone.  Cardiology following.   DVT prophylaxis: Xarelto Code Status: Full Family Communication: Updated patient and wife at bedside. Disposition Plan:  . Patient came from: Home            .  Anticipated d/c place: Home . Barriers to d/c OR conditions which need to be met to effect a safe d/c: Home when clinically improved, on oral cardiac medications and cleared by cardiology hopefully the next 24 hours.   Consultants:   Cardiology: Dr. Radford Pax 08/18/2019  Procedures:   2D echo pending  Chest x-ray 08/18/2019  Antimicrobials:    None   Subjective: Patient sitting up on gurney.  Denies any chest pain or shortness of breath.  No palpitations.  No dizziness.  No lightheadedness.  States he feels great.  Patient has converted to sinus rhythm and on amiodarone drip.  Objective: Vitals:   08/19/19 0715 08/19/19 0815 08/19/19 0830 08/19/19 0845  BP: 118/83 (!) 115/102 116/86 128/86  Pulse: 69 68 69 67  Resp: (!) 25 (!) '22 15 15  ' Temp:      TempSrc:      SpO2: 98% 98% 96% 97%  Weight:      Height:        Intake/Output Summary (Last 24 hours) at 08/19/2019 0958 Last data filed at 08/19/2019 0320 Gross per 24 hour  Intake 200 ml  Output --  Net 200 ml   Filed Weights   08/18/19 1744  Weight: 129.3 kg    Examination:  General exam: Appears calm and comfortable  Respiratory system: Clear to auscultation. Respiratory effort normal. Cardiovascular system: S1 & S2 heard, RRR. No JVD, murmurs, rubs, gallops or clicks. No pedal edema. Gastrointestinal system: Abdomen is nondistended, soft and nontender. No organomegaly or masses felt. Normal bowel sounds heard. Central nervous system: Alert and oriented. No focal neurological deficits. Extremities: Symmetric 5 x 5 power. Skin: No rashes, lesions or ulcers Psychiatry: Judgement and insight appear normal. Mood & affect appropriate.     Data Reviewed: I have personally reviewed following labs and imaging studies  CBC: Recent Labs  Lab 08/18/19 1751 08/19/19 0500  WBC 8.0 5.9  HGB 13.2 12.4*  HCT 40.7 39.1  MCV 87.5 88.3  PLT 161 824*   Basic Metabolic Panel: Recent Labs  Lab 08/18/19 1751 08/19/19 0500  NA 141 139  K 3.9 3.5  CL 108 107  CO2 22 22  GLUCOSE 95 130*  BUN 14 12  CREATININE 0.95 0.89  CALCIUM 8.4* 8.1*  MG 1.8 2.0   GFR: Estimated Creatinine Clearance: 121.4 mL/min (by C-G formula based on SCr of 0.89 mg/dL). Liver Function Tests: Recent Labs  Lab 08/18/19 1751 08/19/19 0500  AST 32 35  ALT 23 28  ALKPHOS 66 59   BILITOT 1.1 0.8  PROT 7.0 6.3*  ALBUMIN 3.1* 2.8*   No results for input(s): LIPASE, AMYLASE in the last 168 hours. No results for input(s): AMMONIA in the last 168 hours. Coagulation Profile: Recent Labs  Lab 08/18/19 2100  INR 1.3*   Cardiac Enzymes: No results for input(s): CKTOTAL, CKMB, CKMBINDEX, TROPONINI in the last 168 hours. BNP (last 3 results) No results for input(s): PROBNP in the last 8760 hours. HbA1C: No results for input(s): HGBA1C in the last 72 hours. CBG: No results for input(s): GLUCAP in the last 168 hours. Lipid Profile: Recent Labs    08/18/19 2100  CHOL 104  HDL 28*  LDLCALC 64  TRIG 62  CHOLHDL 3.7   Thyroid Function Tests: Recent Labs    08/18/19 2211  TSH 0.874   Anemia Panel: No results for input(s): VITAMINB12, FOLATE, FERRITIN, TIBC, IRON, RETICCTPCT in the last 72 hours. Sepsis Labs: No results for input(s): PROCALCITON, LATICACIDVEN in  the last 168 hours.  Recent Results (from the past 240 hour(s))  SARS CORONAVIRUS 2 (TAT 6-24 HRS) Nasopharyngeal Nasopharyngeal Swab     Status: None   Collection Time: 08/18/19 11:10 PM   Specimen: Nasopharyngeal Swab  Result Value Ref Range Status   SARS Coronavirus 2 NEGATIVE NEGATIVE Final    Comment: (NOTE) SARS-CoV-2 target nucleic acids are NOT DETECTED. The SARS-CoV-2 RNA is generally detectable in upper and lower respiratory specimens during the acute phase of infection. Negative results do not preclude SARS-CoV-2 infection, do not rule out co-infections with other pathogens, and should not be used as the sole basis for treatment or other patient management decisions. Negative results must be combined with clinical observations, patient history, and epidemiological information. The expected result is Negative. Fact Sheet for Patients: SugarRoll.be Fact Sheet for Healthcare Providers: https://www.woods-mathews.com/ This test is not yet  approved or cleared by the [place] FDA and  has been authorized for detection and/or diagnosis of SARS-CoV-2 by FDA under an Emergency Use Authorization (EUA). This EUA will remain  in effect (meaning this test can be used) for the duration of the COVID-19 declaration under Section 56 4(b)(1) of the Act, 21 U.S.C. section 360bbb-3(b)(1), unless the authorization is terminated or revoked sooner. Performed at [facility], [place] [address] [place], [place] [address]          Radiology Studies: DG Chest Portable 1 View  Result Date: 08/18/2019 CLINICAL DATA:  Tachycardia EXAM: PORTABLE CHEST 1 VIEW COMPARISON:  05/31/2019 FINDINGS: Stable cardiomegaly. Prior median sternotomy. Mild pulmonary vascular congestion. No focal airspace consolidation, pleural effusion, or pneumothorax. The visualized skeletal structures are unremarkable. IMPRESSION: Cardiomegaly and mild pulmonary vascular congestion without frank edema or focal consolidation. Electronically Signed   By: Davina Poke D.O.   On: 08/18/2019 19:24        Scheduled Meds: . aspirin EC  81 mg Oral Daily  . Melatonin  6 mg Oral QHS  . nicotine  7 mg Transdermal Daily  . pantoprazole  40 mg Oral Daily  . rivaroxaban  20 mg Oral Q breakfast  . rosuvastatin  20 mg Oral QHS  . senna  1 tablet Oral BID   Continuous Infusions: . amiodarone 30 mg/hr (08/19/19 0325)     LOS: 1 day    Time spent: 35 minutes    Irine Seal, MD [facility]   To contact the attending provider between 7A-7P or the covering provider during after hours 7P-7A, please log into the web site [email or web address] and access using universal [place] password for that web site. If you do not have the password, please call the hospital operator.  08/19/2019, 9:58 AM

## 2019-08-19 NOTE — ED Notes (Signed)
Admitting paged to inform of pt new troponin result.

## 2019-08-19 NOTE — Progress Notes (Signed)
Patient had 11 beats of VT. QTC around 500. BP on "softer side". Patient asymptomatic, states that he feels great. Paged MD Janee Morn and made aware.

## 2019-08-19 NOTE — ED Notes (Signed)
SDU  Breakfast ordered  

## 2019-08-19 NOTE — ED Notes (Signed)
Attempted to call report x 1  

## 2019-08-19 NOTE — Progress Notes (Signed)
Progress Note  Patient Name: Calix Heinbaugh Date of Encounter: 08/19/2019  Primary Cardiologist: No primary care provider on file.   Subjective   Breathing is good   He feels a lot better now that he is in SR   No CP     Inpatient Medications    Scheduled Meds: . aspirin EC  81 mg Oral Daily  . Melatonin  6 mg Oral QHS  . nicotine  7 mg Transdermal Daily  . pantoprazole  40 mg Oral Daily  . rivaroxaban  20 mg Oral Q breakfast  . rosuvastatin  20 mg Oral QHS  . senna  1 tablet Oral BID   Continuous Infusions: . amiodarone 30 mg/hr (08/19/19 1412)   PRN Meds: acetaminophen   Vital Signs    Vitals:   08/19/19 1541 08/19/19 1558 08/19/19 1600 08/19/19 1700  BP: 92/68   99/67  Pulse:  61 63   Resp:  20 19 20   Temp:      TempSrc:      SpO2:  91% 100%   Weight:      Height:        Intake/Output Summary (Last 24 hours) at 08/19/2019 1709 Last data filed at 08/19/2019 1444 Gross per 24 hour  Intake 320 ml  Output --  Net 320 ml   Last 3 Weights 08/19/2019 08/18/2019 05/31/2019  Weight (lbs) 290 lb 14.4 oz 285 lb 289 lb 14.5 oz  Weight (kg) 131.951 kg 129.275 kg 131.5 kg      Telemetry    SR   Converted at around 1:45   - Personally Reviewed  ECG    SR   Personally Reviewed  Physical Exam   GEN:   Obeser 64 yo No acute distress.   Neck: No JVD Cardiac: RRR, no murmurs, rubs, or gallops.  Respiratory: Clear to auscultation bilaterally. GI: Soft, nontender, non-distended  MS: No edema; No deformity. Neuro:  Nonfocal  Psych: Normal affect   Labs    High Sensitivity Troponin:   Recent Labs  Lab 08/18/19 1751 08/18/19 2100 08/19/19 0100 08/19/19 0500 08/19/19 1423  TROPONINIHS 893* 869* 997* 750* 447*      Chemistry Recent Labs  Lab 08/18/19 1751 08/19/19 0500 08/19/19 1423  NA 141 139  --   K 3.9 3.5 4.2  CL 108 107  --   CO2 22 22  --   GLUCOSE 95 130*  --   BUN 14 12  --   CREATININE 0.95 0.89  --   CALCIUM 8.4* 8.1*  --   PROT  7.0 6.3*  --   ALBUMIN 3.1* 2.8*  --   AST 32 35  --   ALT 23 28  --   ALKPHOS 66 59  --   BILITOT 1.1 0.8  --   GFRNONAA >60 >60  --   GFRAA >60 >60  --   ANIONGAP 11 10  --      Hematology Recent Labs  Lab 08/18/19 1751 08/19/19 0500  WBC 8.0 5.9  RBC 4.65 4.43  HGB 13.2 12.4*  HCT 40.7 39.1  MCV 87.5 88.3  MCH 28.4 28.0  MCHC 32.4 31.7  RDW 17.2* 17.2*  PLT 161 141*    BNPNo results for input(s): BNP, PROBNP in the last 168 hours.   DDimer No results for input(s): DDIMER in the last 168 hours.   Radiology    DG Chest Portable 1 View  Result Date: 08/18/2019 CLINICAL DATA:  Tachycardia EXAM: PORTABLE  CHEST 1 VIEW COMPARISON:  05/31/2019 FINDINGS: Stable cardiomegaly. Prior median sternotomy. Mild pulmonary vascular congestion. No focal airspace consolidation, pleural effusion, or pneumothorax. The visualized skeletal structures are unremarkable. IMPRESSION: Cardiomegaly and mild pulmonary vascular congestion without frank edema or focal consolidation. Electronically Signed   By: Davina Poke D.O.   On: 08/18/2019 19:24    Cardiac Studies   Echo ordered    Patient Profile     64 y.o. male with hx of atrial flutter with RVR and elev trop     Assessment & Plan    1  PAF/atrial flutter  Pt presented int atrial flutter with RVR  Has had mult cardioversions in the past  He had missed Xarelto on Thurs and Friday   He started IV amiodarone last night because he was tachy and BP low   Converted   Around 1:AM   Would continue but switch to PO  Amiodarone 400 bid in hosp  Echo in early am  Switch to 400 qd  Need to make a decision on long term for this patient    He has not been seen bycardiololgy here  Stressed importance of not missing Xarelto.    2  CAD  Pt with CABG in 2013 in [place]  Followed at [facility]  No CP now that in [place]   Most llikely reflects demand ischemia in this setting   Folllow clinically    3  HTN  BP remains rel low   Would follow  Not on anything  to lower   Plan tentatively for d/c tomorrow after echo and review  For questions or updates, please contact [place] Please consult [email or web address] for contact info under        Signed, Dorris Carnes, MD  08/19/2019, 5:09 PM

## 2019-08-19 NOTE — ED Notes (Addendum)
Dr. Morene Antu notified of pt troponin. Also notified of pt HR and Rhythm.

## 2019-08-20 ENCOUNTER — Inpatient Hospital Stay (HOSPITAL_COMMUNITY): Payer: No Typology Code available for payment source

## 2019-08-20 DIAGNOSIS — I4892 Unspecified atrial flutter: Secondary | ICD-10-CM

## 2019-08-20 DIAGNOSIS — I340 Nonrheumatic mitral (valve) insufficiency: Secondary | ICD-10-CM

## 2019-08-20 LAB — CBC
HCT: 35.1 % — ABNORMAL LOW (ref 39.0–52.0)
Hemoglobin: 11.5 g/dL — ABNORMAL LOW (ref 13.0–17.0)
MCH: 28.5 pg (ref 26.0–34.0)
MCHC: 32.8 g/dL (ref 30.0–36.0)
MCV: 86.9 fL (ref 80.0–100.0)
Platelets: 125 10*3/uL — ABNORMAL LOW (ref 150–400)
RBC: 4.04 MIL/uL — ABNORMAL LOW (ref 4.22–5.81)
RDW: 17.2 % — ABNORMAL HIGH (ref 11.5–15.5)
WBC: 5.7 10*3/uL (ref 4.0–10.5)
nRBC: 0 % (ref 0.0–0.2)

## 2019-08-20 LAB — COMPREHENSIVE METABOLIC PANEL
ALT: 30 U/L (ref 0–44)
AST: 31 U/L (ref 15–41)
Albumin: 2.7 g/dL — ABNORMAL LOW (ref 3.5–5.0)
Alkaline Phosphatase: 58 U/L (ref 38–126)
Anion gap: 7 (ref 5–15)
BUN: 13 mg/dL (ref 8–23)
CO2: 27 mmol/L (ref 22–32)
Calcium: 8.1 mg/dL — ABNORMAL LOW (ref 8.9–10.3)
Chloride: 108 mmol/L (ref 98–111)
Creatinine, Ser: 1.01 mg/dL (ref 0.61–1.24)
GFR calc Af Amer: >60 mL/min (ref >60)
GFR calc non Af Amer: >60 mL/min (ref >60)
Glucose, Bld: 98 mg/dL (ref 70–99)
Potassium: 4 mmol/L (ref 3.5–5.1)
Sodium: 142 mmol/L (ref 135–145)
Total Bilirubin: 0.8 mg/dL (ref 0.3–1.2)
Total Protein: 5.9 g/dL — ABNORMAL LOW (ref 6.5–8.1)

## 2019-08-20 LAB — ECHOCARDIOGRAM COMPLETE
Height: 74 in
Weight: 4678.4 oz

## 2019-08-20 LAB — MAGNESIUM: Magnesium: 1.8 mg/dL (ref 1.7–2.4)

## 2019-08-20 MED ORDER — LISINOPRIL 10 MG PO TABS: 10.0000 mg | ORAL_TABLET | Freq: Every day | ORAL | 1 refills | Status: DC

## 2019-08-20 MED ORDER — CARVEDILOL 12.5 MG PO TABS: 12.5000 mg | ORAL_TABLET | Freq: Two times a day (BID) | ORAL | 1 refills | Status: DC

## 2019-08-20 MED ORDER — NICOTINE 7 MG/24HR TD PT24: 7.0000 mg | MEDICATED_PATCH | Freq: Every day | TRANSDERMAL | 0 refills | Status: DC

## 2019-08-20 MED ORDER — SODIUM CHLORIDE 0.9 % IV SOLN
INTRAVENOUS | Status: DC | PRN
  Administered 2019-08-20: 250 mL via INTRAVENOUS

## 2019-08-20 MED ORDER — MAGNESIUM SULFATE 4 GM/100ML IV SOLN
4.0000 g | Freq: Once | INTRAVENOUS | Status: AC
  Administered 2019-08-20: 4 g via INTRAVENOUS
  Filled 2019-08-20: qty 100

## 2019-08-20 MED ORDER — AMIODARONE HCL 200 MG PO TABS: 400.0000 mg | ORAL_TABLET | Freq: Every day | ORAL | 1 refills | Status: DC

## 2019-08-20 MED FILL — CARVEDILOL 12.5 MG TABLET: 12.5 | 30 days supply | Qty: 60 | Fill #0

## 2019-08-20 MED FILL — LISINOPRIL 10 MG TABS: 10 | 30 days supply | Qty: 30 | Fill #0

## 2019-08-20 MED FILL — AMIODARONE HCL 200 MG TAB: 200 | 30 days supply | Qty: 42 | Fill #0

## 2019-08-20 NOTE — TOC Progression Note (Addendum)
Transition of Care [facility]) - Progression Note    Patient Details  Name: Cameron Schultz MRN: [id number] Date of Birth: April 08, 1956  [facility] CM/SW Contact  Leone Haven, RN Phone Number: 08/20/2019, 10:07 AM  Clinical Narrative:    NCM notified April with [facility] that patient is here at the hospital.  He is a member of the [employer], his provider is [name] fax is [phone] , CSW is [employer], pager [phone] , office [phone] ext [phone].   Patient goes to the [place] to get medications. NCM will fax dc summary to provider.  TOC pharmacy is filling the medications for patient today, amount for meds is around 20.00, patient states he can pay for this and he will get his nicotine patch from the [place].         Expected Discharge Plan and Services                                                 Social Determinants of Health (SDOH) Interventions    Readmission Risk Interventions No flowsheet data found.

## 2019-08-20 NOTE — Progress Notes (Signed)
  Echocardiogram 2D Echocardiogram has been performed.  [name] 08/20/2019, 8:43 AM

## 2019-08-20 NOTE — Plan of Care (Signed)
  Problem: Elimination: Goal: Will not experience complications related to bowel motility Outcome: Progressing   Problem: Elimination: Goal: Will not experience complications related to urinary retention Outcome: Progressing   Problem: Pain Managment: Goal: General experience of comfort will improve Outcome: Progressing   Problem: Safety: Goal: Ability to remain free from injury will improve Outcome: Progressing   

## 2019-08-20 NOTE — Progress Notes (Addendum)
Discharge education and medication education given to patient  with teach back. Education on low sodium heart healthy diet and increasing activity given.  All questions and concerns answered. Peripheral IV and telemetry leads removed. All patient belongings given to patient. Medications form [facility] pharmacy were delivered to patient's room. Patient transported to main entrance via wheelchair by nurse tech.

## 2019-08-20 NOTE — Progress Notes (Addendum)
Progress Note  Patient Name: Cameron Schultz Date of Encounter: 08/20/2019  Primary Cardiologist: Kristeen Miss, MD and the [facility]  Subjective   No chest pain and no complaints.  Though he had apnea last night and had rough time waking with it.    Inpatient Medications    Scheduled Meds: . amiodarone  400 mg Oral BID  . aspirin EC  81 mg Oral Daily  . Melatonin  6 mg Oral QHS  . nicotine  7 mg Transdermal Daily  . pantoprazole  40 mg Oral Daily  . rivaroxaban  20 mg Oral Q breakfast  . rosuvastatin  20 mg Oral QHS  . senna  1 tablet Oral BID   Continuous Infusions: . amiodarone 30 mg/hr (08/19/19 1412)  . magnesium sulfate bolus IVPB     PRN Meds: acetaminophen   Vital Signs    Vitals:   08/20/19 0350 08/20/19 0354 08/20/19 0355 08/20/19 0739  BP:   116/79 113/65  Pulse: (!) 59 61 61   Resp: 18 16 17    Temp:   97.9 F (36.6 C) 98 F (36.7 C)  TempSrc:    Oral  SpO2: 98% 98% 98%   Weight:   132.6 kg   Height:        Intake/Output Summary (Last 24 hours) at 08/20/2019 0755 Last data filed at 08/20/2019 0742 Gross per 24 hour  Intake 840 ml  Output 1025 ml  Net -185 ml   Last 3 Weights 08/20/2019 08/19/2019 08/18/2019  Weight (lbs) 292 lb 6.4 oz 290 lb 14.4 oz 285 lb  Weight (kg) 132.632 kg 131.951 kg 129.275 kg      Telemetry    11 beats of WCT possible aberrancy vs. NSVT. - Personally Reviewed  ECG    Yesterday at 1700 SR with LA enlargement and Q waves in III, AVF, similar to 05/31/19 EKG   - Personally Reviewed  Physical Exam   GEN: No acute distress.   Neck: No JVD Cardiac: RRR, no murmurs, rubs, or gallops.  Respiratory: Clear to auscultation bilaterally. GI: Soft, nontender, non-distended  MS: No edema; No deformity. SCDs in place Neuro:  Nonfocal  Psych: Normal affect   Labs    High Sensitivity Troponin:   Recent Labs  Lab 08/18/19 1751 08/18/19 2100 08/19/19 0100 08/19/19 0500 08/19/19 1423  TROPONINIHS 893* 869* 997* 750* 447*     Chemistry Recent Labs  Lab 08/18/19 1751 08/18/19 1751 08/19/19 0500 08/19/19 1423 08/20/19 0429  NA 141  --  139  --  142  K 3.9   < > 3.5 4.2 4.0  CL 108  --  107  --  108  CO2 22  --  22  --  27  GLUCOSE 95  --  130*  --  98  BUN 14  --  12  --  13  CREATININE 0.95  --  0.89  --  1.01  CALCIUM 8.4*  --  8.1*  --  8.1*  PROT 7.0  --  6.3*  --  5.9*  ALBUMIN 3.1*  --  2.8*  --  2.7*  AST 32  --  35  --  31  ALT 23  --  28  --  30  ALKPHOS 66  --  59  --  58  BILITOT 1.1  --  0.8  --  0.8  GFRNONAA >60  --  >60  --  >60  GFRAA >60  --  >60  --  >60  ANIONGAP 11  --  10  --  7   < > = values in this interval not displayed.     Hematology Recent Labs  Lab 08/18/19 1751 08/19/19 0500 08/20/19 0429  WBC 8.0 5.9 5.7  RBC 4.65 4.43 4.04*  HGB 13.2 12.4* 11.5*  HCT 40.7 39.1 35.1*  MCV 87.5 88.3 86.9  MCH 28.4 28.0 28.5  MCHC 32.4 31.7 32.8  RDW 17.2* 17.2* 17.2*  PLT 161 141* 125*    BNPNo results for input(s): BNP, PROBNP in the last 168 hours.   DDimer No results for input(s): DDIMER in the last 168 hours.   Radiology    DG Chest Portable 1 View  Result Date: 08/18/2019 CLINICAL DATA:  Tachycardia EXAM: PORTABLE CHEST 1 VIEW COMPARISON:  05/31/2019 FINDINGS: Stable cardiomegaly. Prior median sternotomy. Mild pulmonary vascular congestion. No focal airspace consolidation, pleural effusion, or pneumothorax. The visualized skeletal structures are unremarkable. IMPRESSION: Cardiomegaly and mild pulmonary vascular congestion without frank edema or focal consolidation. Electronically Signed   By: Duanne Guess D.O.   On: 08/18/2019 19:24    Cardiac Studies   Echo pending    Echo 05/31/19  1. Left ventricular ejection fraction, by visual estimation, is 50 to  55%. The left ventricle has low normal function. There is moderately  increased left ventricular hypertrophy.  2. Left ventricular diastolic parameters are consistent with Grade I  diastolic  dysfunction (impaired relaxation).  3. The left ventricle has no regional wall motion abnormalities.  4. Global right ventricle has mildly reduced systolic function.The right  ventricular size is normal. No increase in right ventricular wall  thickness.  5. Left atrial size was moderately dilated.  6. Right atrial size was mildly dilated.  7. The mitral valve is abnormal. Trivial mitral valve regurgitation.  8. The tricuspid valve is grossly normal.  9. The aortic valve is tricuspid. Aortic valve regurgitation is not  visualized.  10. The pulmonic valve was grossly normal. Pulmonic valve regurgitation is  not visualized.  11. The inferior vena cava is normal in size with greater than 50%  respiratory variability, suggesting right atrial pressure of 3 mmHg.   FINDINGS  Left Ventricle: Left ventricular ejection fraction, by visual estimation,  is 50 to 55%. The left ventricle has low normal function. The left  ventricle has no regional wall motion abnormalities. There is moderately  increased left ventricular hypertrophy.  Left ventricular diastolic parameters are consistent with Grade I  diastolic dysfunction (impaired relaxation). Indeterminate filling  pressures.   Patient Profile     64 y.o. male with hx of atrial flutter with RVR on xarelto, HTN, HLD, OSA does not use CPAP, tobacco use, ASCAD with remote CABG 2013 in [place], now back with a fib RVR and elev trop     Assessment & Plan    1  PAF/atrial flutter  Pt presented int atrial flutter with RVR (08/18/19)  Has had mult cardioversions in the past  He had missed Xarelto on Thurs and Friday   He started IV amiodarone because he was tachy and BP low   Converted   Around 1:AM   Would continue but switch to PO  Amiodarone 400 bid in hosp  Echo in early am  Switch to 400 qd prior to discharge. TSH 0.874 Need to make a decision on long term for this patient    He has not been seen by cardiololgy here post hospital-  He states  he will if [place] pays for it.  Dr. Harrington Challenger -Stressed importance of not missing Xarelto.    2  CAD  Pt with CABG in 2013 in [place] at [facility]  No CP now that in SR --  HS Troponin 893, 869, 007, 750, 447 Most llikely reflects demand ischemia in this setting  is on ASA.  Folllow clinically    3  HTN  BP remains rel low   Would follow  Not on anything to lower  108 to 123/71-91  4.  Tobacco use on nicotine patch.  Recommendations given to stop  5.  OSA without use of CPAP.  Recommending to resume, may need sleep study- through the [place].  6.  HLD on crestor 20 mg.  LDL this admit 64   Possible discharge after echo.   For questions or updates, please contact [place] Please consult [email or web address] for contact info under      Signed, Cecilie Kicks, NP  08/20/2019, 7:55 AM    History and all data above reviewed.  Patient examined.  I agree with the findings as above.  He feels OK and is anxious to go home.  No pain.  Breathing OK.  Tele: Wide complex possibly NSVT.  The patient exam reveals COR:RRR  ,  Lungs: Clear  ,  Abd: Positive bowel sounds, no rebound no guarding, Ext No edema  .  All available labs, radiology testing, previous records reviewed. Agree with documented assessment and plan. Atrial flutter:  He wants to go home.  OK to discharge.  Now back in NSR.  I would suggest 400 mg amiodarone.   However, he will need to see his primary cardiologist within a couple of weeks after discharge with plans to reduce this and consider whether to continue this long term.  Back on anticoagulation.  Trop has drifted down.  EF is low normal and possibly slightly lower than previous.  I would suggest OK to go home.  I have a follow up phone call into the cardiologist at the [facility] to discuss transition of care and might suggest out patient [facility] for risk stratification if this has not been done.    (Discharge meds:  Would send home on Coreg at a lower dose 12.5 mg bid and lower dose ACE  inhibitor lisinopril 10 mg.  Would hold Cardizem.  Can have amiodarone 400 mg for two weeks and then 200 mg daily.)   Minus Breeding  11:29 AM  08/20/2019

## 2019-08-20 NOTE — Discharge Summary (Signed)
Physician Discharge Summary  Cameron Schultz WER:[id number] DOB: 02-24-1956 DOA: 08/18/2019  PCP: Cameron Come, MD  Admit date: 08/18/2019 Discharge date: 08/20/2019  Time spent: 60 minutes  Recommendations for Outpatient Follow-up:  1. Follow-up with cardiologist at the [facility] in 1 week.  On follow-up patient's A. fib a flutter will need to be reassessed.  Patient will need a basic metabolic profile done to follow-up on electrolytes and renal function.  Consideration for outpatient Lexiscan Myoview needs to be considered as recommended per cardiology during this hospitalization.  Blood pressure also need to be reassessed. 2. Follow-up with Cameron Come, MD in 2 weeks.  On follow-up patient may need referral for evaluation for his obstructive sleep apnea and CPAP usage.  Patient will need a basic metabolic profile done to follow-up on electrolytes and renal function.     Discharge Diagnoses:  Principal Problem:   Atrial fibrillation with RVR (HCC) Active Problems:   Chronic anticoagulation   Essential hypertension   Hyperlipidemia   OSA on CPAP   Obesity (BMI 30-39.9)   Tobacco abuse   CAD (coronary artery disease)   Discharge Condition: Stable and improved  Diet recommendation: Heart healthy  Filed Weights   08/18/19 1744 08/19/19 1320 08/20/19 0355  Weight: 129.3 kg 132 kg 132.6 kg    History of present illness:  HPI per Dr. [name] is a 64 y.o. male with PMH PAF on Xarelto, HTN, HLD, OSA, tobacco abuse, CAD s/p CABG in 2013 who presented to ER with palpitations and admitted for Afib with RVR. Patient reported palpitations since 3/18. He had been out of town in [place] visiting family and missed two doses of Xarelto. Patient denied history of drug use for many years but did happen to use cocaine on Wednesday. Reported some dizziness, lightheadedness and shortness of breath but otherwise denied any other complaints at this time. Reports that generally when he has  episodes of a fib his heart rate will go up and down but this time it has stayed elevated. Denied any pain. Denied alcohol use. Current smoker. He had not eaten all day and is hungry. Denied fever, chills, cough, chest pain, abdominal pain, nausea, vomiting, diarrhea, constipation, dysuria, hematuria, hematochezia, melena, difficulty moving arms/legs, speech difficulty, trouble eating, confusion or any other complaints.  In the ED: HR elevated to 140-150's and EKG with a flutter. Vitals otherwise stable. CBC, CMP and Mag WNL. Trop elevated to 893. Cardiology consulted and reported patient not a candidate for cardioversion due to missing two doses of Xarelto. Admission for further rate/rhythm control.  CXR without acute cardiopulmonary abnormality. Cardiomegaly noted.  Patient was given 10 mg IV diltiazem.   Hospital Course:  1 A. fib with RVR Patient had presented and noted to be in A. fib.  Patient stated when out of town and forgot to take his medications with him and as such had not been on his medications for a few days.  Troponins elevated at 869 >>>997>>>750.  TSH within normal limits at 0.874.  2D echo a EF of 45 to 50%, mildly decreased left ventricular function, left ventricle demonstrated regional wall abnormalities, moderate LVH, grade 1 diastolic dysfunction, moderate hypokinesis of the left ventricular, basal mid inferior wall.  Right ventricular systolic function mildly reduced. Patient subsequently placed on IV amiodarone drip secondary to borderline blood pressure.  Patient converted to normal sinus rhythm around 1 AM on 08/19/2019 and remained in sinus rhythm.  Patient was seen by cardiology and followed during the hospitalization.  Patient's Cardizem and beta-blocker were held.  Amiodarone was subsequently transition to 400 mg orally twice daily.  Cardiology recommended continuation of amiodarone 400 mg twice daily x2 weeks, then 200 mg daily with close outpatient follow-up with his  cardiologist at the [place].  Cardiology also recommended patient being discharged on a decreased dose of his Coreg at 12.5 mg twice daily as well as discontinuation of patient's Cardizem.  Patient was also maintained on Xarelto for anticoagulation throughout the hospitalization and importance of compliance with Xarelto was stressed to the patient.  Outpatient follow-up with cardiology in the [place].   2 elevated troponin Felt secondary to demand ischemia per cardiology secondary to problem #1.  Patient denies any ongoing chest pain.  2D echo with a EF of 45 to 50%, mildly decreased left ventricular function, left ventricle demonstrated regional wall abnormalities, moderate LVH, grade 1 diastolic dysfunction, moderate hypokinesis of the left ventricular, basal mid inferior wall.  Right ventricular systolic function mildly reduced.  Patient was assessed by cardiology during the hospitalization and recommends close outpatient with consideration for [facility] for risk stratification if this has not been done.  Cardiology recommended discharge on lower dose of Coreg 12.5 mg twice daily, lower dose of ACE inhibitor lisinopril 10 mg daily discontinuation of Cardizem and discharged on amiodarone.  Outpatient follow-up with cardiology at the [facility].   3.  Hyperlipidemia Patient statin was decreased during the hospitalization to 20 mg daily due to probable drug drug interaction with amiodarone.  Patient was discharged back on home regimen of statin.  Outpatient follow-up with cardiology.   4.  Tobacco abuse Tobacco cessation stressed to patient.  Patient maintained on a nicotine patch during the hospitalization.  Outpatient follow-up..   5.  OSA CPAP nightly.  6.  Hypertension Blood pressure noted to be borderline on admission and as such Coreg and lisinopril were held throughout the hospitalization.  Blood pressure improved.  Patient maintained on amiodarone during the hospitalization.  Per cardiology patient  be discharged on a decreased dose of Coreg at 12.5 mg twice daily as well as a decreased dose of lisinopril at 10 mg daily.  Outpatient follow-up.   7.  Coronary artery disease status post CABG Currently stable.    Patient was maintained on aspirin and statin.  Patient's beta-blocker and ACE inhibitor held due to borderline blood pressure and patient in A. fib.  Patient was placed on amiodarone.  Cardiology assessed and followed the patient throughout the hospitalization.  On discharge cardiology had recommended decreasing patient's Coreg to 12.5 mg twice daily, decreasing lisinopril to 10 mg daily and discontinuing Cardizem.  Patient will be discharged on amiodarone for A. fib.  Patient will need close outpatient follow-up with his cardiologist at the [facility] in 1 week.   Procedures:  2D echo 08/20/2019  Chest x-ray 08/18/2019  Consultations:  Cardiology: Dr. Mayford Knife 08/18/2019  Discharge Exam: Vitals:   08/20/19 0739 08/20/19 1155  BP: 113/65 114/74  Pulse: 66   Resp:    Temp: 98 F (36.7 C) 98.2 F (36.8 C)  SpO2: 97%     General: NAD Cardiovascular: RRR Respiratory: CTAB  Discharge Instructions   Discharge Instructions    Diet - low sodium heart healthy   Complete by: As directed    Increase activity slowly   Complete by: As directed      Allergies as of 08/20/2019   No Known Allergies     Medication List    STOP taking these medications  diltiazem 120 MG 24 hr capsule Commonly known as: Cardizem CD   HYDROcodone-acetaminophen 7.5-325 MG tablet Commonly known as: NORCO     TAKE these medications   amiodarone 200 MG tablet Commonly known as: PACERONE Take 2 tablets (400 mg total) by mouth daily. Take 2 tablets (400mg ) daily x2 weeks, then 1 tablet (200mg ) daily thereafter.   aspirin EC 81 MG tablet Take 81 mg by mouth daily.   carvedilol 12.5 MG tablet Commonly known as: COREG Take 1 tablet (12.5 mg total) by mouth 2 (two) times daily with a meal. What  changed:   medication strength  how much to take   docusate sodium 100 MG capsule Commonly known as: COLACE Take 1 capsule (100 mg total) by mouth 2 (two) times daily.   lisinopril 10 MG tablet Commonly known as: ZESTRIL Take 1 tablet (10 mg total) by mouth daily. What changed:   medication strength  how much to take   Melatonin 3 MG Caps Take 6 mg by mouth at bedtime.   nicotine 7 mg/24hr patch Commonly known as: NICODERM CQ - dosed in mg/24 hr Place 1 patch (7 mg total) onto the skin daily. Start taking on: August 21, 2019   ondansetron 4 MG tablet Commonly known as: ZOFRAN Take 1 tablet (4 mg total) by mouth every 6 (six) hours as needed for nausea.   pantoprazole 40 MG tablet Commonly known as: PROTONIX Take 1 tablet (40 mg total) by mouth daily.   rosuvastatin 40 MG tablet Commonly known as: CRESTOR Take 40 mg by mouth at bedtime.   senna 8.6 MG Tabs tablet Commonly known as: SENOKOT Take 1 tablet (8.6 mg total) by mouth 2 (two) times daily.   VITAMIN C PO Take 1 tablet by mouth daily.   Xarelto 20 MG Tabs tablet Generic drug: rivaroxaban Take 20 mg by mouth daily with breakfast.      No Known Allergies Follow-up Information    Cameron ComeWoods, Samuel, MD. Schedule an appointment as soon as possible for a visit in 2 week(s).   Specialty: Internal Medicine Contact information: [address] [place] [address] [phone]        Nahser, Deloris PingPhilip J, MD .   Specialty: Cardiology Contact information: [address] [place] [address] [phone]        [facility] Schedule an appointment as soon as possible for a visit in 1 week(s).            The results of significant diagnostics from this hospitalization (including imaging, microbiology, ancillary and laboratory) are listed below for reference.    Significant Diagnostic Studies: DG Chest Portable 1 View  Result Date: 08/18/2019 CLINICAL DATA:  Tachycardia  EXAM: PORTABLE CHEST 1 VIEW COMPARISON:  05/31/2019 FINDINGS: Stable cardiomegaly. Prior median sternotomy. Mild pulmonary vascular congestion. No focal airspace consolidation, pleural effusion, or pneumothorax. The visualized skeletal structures are unremarkable. IMPRESSION: Cardiomegaly and mild pulmonary vascular congestion without frank edema or focal consolidation. Electronically Signed   By: Duanne GuessNicholas  Plundo D.O.   On: 08/18/2019 19:24   ECHOCARDIOGRAM COMPLETE  Result Date: 08/20/2019    ECHOCARDIOGRAM REPORT   Patient Name:   Cameron PloverRICHARD Pitney Date of Exam: 08/20/2019 Medical Rec #:  [id number]      Height:       74.0 in Accession #:    [id number]     Weight:       292.4 lb Date of Birth:  1955-09-07      BSA:  2.554 m Patient Age:    64 years       BP:           113/65 mmHg Patient Gender: M              HR:           66 bpm. Exam Location:  Inpatient Procedure: 2D Echo Indications:    Atrial Fibrillation 427.31 / I48.91  History:        Patient has prior history of Echocardiogram examinations, most                 recent 05/31/2019. CAD; Risk Factors:Hypertension, Dyslipidemia                 and Tobacco abuse.  Sonographer:    Leeroy Bock Turrentine Referring Phys: [id number] CHELSEA N FAIR IMPRESSIONS  1. Left ventricular ejection fraction, by estimation, is 45 to 50%. The left ventricle has mildly decreased function. The left ventricle demonstrates regional wall motion abnormalities (see scoring diagram/findings for description). There is moderate left ventricular hypertrophy. Left ventricular diastolic parameters are consistent with Grade I diastolic dysfunction (impaired relaxation). There is moderate hypokinesis of the left ventricular, basal-mid inferior wall.  2. Right ventricular systolic function is mildly reduced. The right ventricular size is normal.  3. Left atrial size was severely dilated.  4. The mitral valve is abnormal. Mild mitral valve regurgitation.  5. The aortic valve is tricuspid.  Aortic valve regurgitation is not visualized.  6. The inferior vena cava is dilated in size with >50% respiratory variability, suggesting right atrial pressure of 8 mmHg. Comparison(s): Changes from prior study are noted. 05/31/19: LVEF 50-55%. FINDINGS  Left Ventricle: Left ventricular ejection fraction, by estimation, is 45 to 50%. The left ventricle has mildly decreased function. The left ventricle demonstrates regional wall motion abnormalities. Moderate hypokinesis of the left ventricular, basal-mid inferior wall. The left ventricular internal cavity size was normal in size. There is moderate left ventricular hypertrophy. Left ventricular diastolic parameters are consistent with Grade I diastolic dysfunction (impaired relaxation). Indeterminate filling pressures. Right Ventricle: The right ventricular size is normal. No increase in right ventricular wall thickness. Right ventricular systolic function is mildly reduced. Left Atrium: Left atrial size was severely dilated. Right Atrium: Right atrial size was normal in size. Pericardium: There is no evidence of pericardial effusion. Mitral Valve: The mitral valve is abnormal. There is mild thickening of the mitral valve leaflet(s). Mild mitral valve regurgitation. Tricuspid Valve: The tricuspid valve is grossly normal. Tricuspid valve regurgitation is not demonstrated. Aortic Valve: The aortic valve is tricuspid. Aortic valve regurgitation is not visualized. Pulmonic Valve: The pulmonic valve was normal in structure. Pulmonic valve regurgitation is not visualized. Aorta: The aortic root and ascending aorta are structurally normal, with no evidence of dilitation. Venous: The inferior vena cava is dilated in size with greater than 50% respiratory variability, suggesting right atrial pressure of 8 mmHg. IAS/Shunts: No atrial level shunt detected by color flow Doppler.  LEFT VENTRICLE PLAX 2D LVIDd:         5.60 cm      Diastology LVIDs:         4.25 cm      LV e'  lateral:   11.90 cm/s LV PW:         1.10 cm      LV E/e' lateral: 6.1 LV IVS:        1.10 cm      LV e' medial:  5.66 cm/s LVOT diam:     2.10 cm      LV E/e' medial:  12.7 LV SV:         60 LV SV Index:   24 LVOT Area:     3.46 cm  LV Volumes (MOD) LV vol d, MOD A2C: 121.0 ml LV vol d, MOD A4C: 145.0 ml LV vol s, MOD A2C: 64.7 ml LV vol s, MOD A4C: 74.2 ml LV SV MOD A2C:     56.3 ml LV SV MOD A4C:     145.0 ml LV SV MOD BP:      64.2 ml RIGHT VENTRICLE RV S prime:     8.24 cm/s TAPSE (M-mode): 1.5 cm LEFT ATRIUM              Index       RIGHT ATRIUM           Index LA diam:        4.60 cm  1.80 cm/m  RA Area:     21.10 cm LA Vol (A2C):   137.0 ml 53.64 ml/m RA Volume:   53.60 ml  20.98 ml/m LA Vol (A4C):   108.0 ml 42.28 ml/m LA Biplane Vol: 124.0 ml 48.55 ml/m  AORTIC VALVE LVOT Vmax:   87.50 cm/s LVOT Vmean:  63.600 cm/s LVOT VTI:    0.174 m  AORTA Ao Root diam: 3.80 cm MITRAL VALVE MV Area (PHT): 3.27 cm    SHUNTS MV Decel Time: 232 msec    Systemic VTI:  0.17 m MV E velocity: 72.00 cm/s  Systemic Diam: 2.10 cm MV A velocity: 35.10 cm/s MV E/A ratio:  2.05 Lyman Bishop MD Electronically signed by Lyman Bishop MD Signature Date/Time: 08/20/2019/11:09:56 AM    Final     Microbiology: Recent Results (from the past 240 hour(s))  SARS CORONAVIRUS 2 (TAT 6-24 HRS) Nasopharyngeal Nasopharyngeal Swab     Status: None   Collection Time: 08/18/19 11:10 PM   Specimen: Nasopharyngeal Swab  Result Value Ref Range Status   SARS Coronavirus 2 NEGATIVE NEGATIVE Final    Comment: (NOTE) SARS-CoV-2 target nucleic acids are NOT DETECTED. The SARS-CoV-2 RNA is generally detectable in upper and lower respiratory specimens during the acute phase of infection. Negative results do not preclude SARS-CoV-2 infection, do not rule out co-infections with other pathogens, and should not be used as the sole basis for treatment or other patient management decisions. Negative results must be combined with clinical  observations, patient history, and epidemiological information. The expected result is Negative. Fact Sheet for Patients: SugarRoll.be Fact Sheet for Healthcare Providers: https://www.woods-mathews.com/ This test is not yet approved or cleared by the [place] FDA and  has been authorized for detection and/or diagnosis of SARS-CoV-2 by FDA under an Emergency Use Authorization (EUA). This EUA will remain  in effect (meaning this test can be used) for the duration of the COVID-19 declaration under Section 56 4(b)(1) of the Act, 21 U.S.C. section 360bbb-3(b)(1), unless the authorization is terminated or revoked sooner. Performed at [facility], [place] [address], [place], [place] [address]      Labs: Basic Metabolic Panel: Recent Labs  Lab 08/18/19 1751 08/19/19 0500 08/19/19 1423 08/20/19 0429  NA 141 139  --  142  K 3.9 3.5 4.2 4.0  CL 108 107  --  108  CO2 22 22  --  27  GLUCOSE 95 130*  --  98  BUN 14 12  --  13  CREATININE 0.95 0.89  --  1.01  CALCIUM 8.4* 8.1*  --  8.1*  MG 1.8 2.0  --  1.8   Liver Function Tests: Recent Labs  Lab 08/18/19 1751 08/19/19 0500 08/20/19 0429  AST 32 35 31  ALT 23 28 30   ALKPHOS 66 59 58  BILITOT 1.1 0.8 0.8  PROT 7.0 6.3* 5.9*  ALBUMIN 3.1* 2.8* 2.7*   No results for input(s): LIPASE, AMYLASE in the last 168 hours. No results for input(s): AMMONIA in the last 168 hours. CBC: Recent Labs  Lab 08/18/19 1751 08/19/19 0500 08/20/19 0429  WBC 8.0 5.9 5.7  HGB 13.2 12.4* 11.5*  HCT 40.7 39.1 35.1*  MCV 87.5 88.3 86.9  PLT 161 141* 125*   Cardiac Enzymes: No results for input(s): CKTOTAL, CKMB, CKMBINDEX, TROPONINI in the last 168 hours. BNP: BNP (last 3 results) No results for input(s): BNP in the last 8760 hours.  ProBNP (last 3 results) No results for input(s): PROBNP in the last 8760 hours.  CBG: No results for input(s): GLUCAP in the last 168  hours.     Signed:  08/22/19 MD.  [facility] 08/20/2019, 1:12 PM

## 2019-08-20 NOTE — Discharge Instructions (Signed)

## 2019-09-20 ENCOUNTER — Telehealth: Payer: Self-pay | Admitting: Cardiovascular Disease

## 2019-09-20 NOTE — Telephone Encounter (Signed)
[facility] called stating [facility]. I informed him once it is received he will get a call to schedule.

## 2019-09-21 NOTE — Telephone Encounter (Signed)
Cameron Schultz from the [place] faxed over a referral to billing - he then faxed me a referral for scheduling. The referral is for EP ablation procedure. I faxed over referral to [facility] for scheduling.

## 2019-09-26 NOTE — Progress Notes (Signed)
Cardiology Office Note:    Date:  09/27/2019   ID:  Cameron Schultz, DOB 09/28/1955, MRN [id number]  PCP:  [name], MD  Cardiologist:  Kristeen Miss, MD  Electrophysiologist:  None   Referring MD: [name], MD   Chief Complaint  Patient presents with  . Chest Pain    History of Present Illness:    Cameron Schultz is a 64 y.o. male with a hx of paroxysmal atrial fibrillation on Xarelto, hypertension, hyperlipidemia, OSA on CPAP, tobacco use, CAD status post CABG in 2013 who is referred by Dr. Joseph Art for evaluation of atrial fibrillation/flutter.  He was admitted to [facility] with atrial flutter with RVR on 08/18/2019.  Heart rate up to 150s.  He was started on IV amiodarone and converted to normal sinus rhythm.  He was discharged on amiodarone 400 mg daily x2 weeks then 200 mg daily.  Follows with cardiology at the [place].  Had recommended evaluation for ablation.  He has appointment with Dr Johney Frame tomorrow.    Patient states that he always has symptoms when he goes into AF/AFL.  Reports that he feels his heart is racing and has lightheadedness/near syncope.  Since starting the amiodarone, he reports he has had no symptoms to suggest recurrence of atrial fibrillation/flutter.  He does report that he has been having chest pain.  Occurs about once per month, in center of his chest.  Dull aching pain, can last for 5 minutes or so.  No clear relationship with exertion, the patient states he has not been exercising.  Most exertion he does is walking his dog for about 0.25 miles.  Main complaint when he walks his dog is that he has been having pain in the back of his legs.  He continues to smoke, 2 to 3 cigarettes/day.    Past Medical History:  Diagnosis Date  . Acute upper GI bleed   . Atrial fibrillation (HCC)   . Coronary artery disease   . Dysrhythmia    a fib  . Hypertension   . Myocardial infarction (HCC)    unknown  . Sleep apnea     Past Surgical History:  Procedure Laterality  Date  . CARDIAC SURGERY    . CERVICAL FUSION    . CORONARY ARTERY BYPASS GRAFT  2013   1  . ESOPHAGOGASTRODUODENOSCOPY (EGD) WITH PROPOFOL N/A 12/21/2016   Procedure: ESOPHAGOGASTRODUODENOSCOPY (EGD) WITH PROPOFOL;  Surgeon: Kathi Der, MD;  Location: [facility];  Service: Gastroenterology;  Laterality: N/A;  . TONSILLECTOMY    . TOTAL HIP ARTHROPLASTY Left 02/22/2019   Procedure: TOTAL HIP ARTHROPLASTY ANTERIOR APPROACH;  Surgeon: Samson Frederic, MD;  Location: [facility];  Service: Orthopedics;  Laterality: Left;    Current Medications: Current Meds  Medication Sig  . amiodarone (PACERONE) 200 MG tablet Take 2 tablets (400 mg total) by mouth daily. Take 2 tablets (400mg ) daily x2 weeks, then 1 tablet (200mg ) daily thereafter.  . Ascorbic Acid (VITAMIN C PO) Take 1 tablet by mouth daily.  aspirin EC 81 MG tablet Take 81 mg by mouth daily.  . carvedilol (COREG) 12.5 MG tablet Take 1 tablet (12.5 mg total) by mouth 2 (two) times daily with a meal.  . docusate sodium (COLACE) 100 MG capsule Take 1 capsule (100 mg total) by mouth 2 (two) times daily.  lisinopril (ZESTRIL) 10 MG tablet Take 1 tablet (10 mg total) by mouth daily.  . Melatonin 3 MG CAPS Take 6 mg by mouth at bedtime.  . nicotine (  NICODERM CQ - DOSED IN MG/24 HR) 7 mg/24hr patch Place 1 patch (7 mg total) onto the skin daily.  . pantoprazole (PROTONIX) 40 MG tablet Take 1 tablet (40 mg total) by mouth daily.  . rivaroxaban (XARELTO) 20 MG TABS tablet Take 20 mg by mouth daily with breakfast.   . rosuvastatin (CRESTOR) 40 MG tablet Take 40 mg by mouth at bedtime.  . [DISCONTINUED] ondansetron (ZOFRAN) 4 MG tablet Take 1 tablet (4 mg total) by mouth every 6 (six) hours as needed for nausea.  . [DISCONTINUED] senna (SENOKOT) 8.6 MG TABS tablet Take 1 tablet (8.6 mg total) by mouth 2 (two) times daily.     Allergies:   Patient has no known allergies.   Social History   Socioeconomic History  . Marital status: Married      Spouse name: Not on file  . Number of children: Not on file  . Years of education: Not on file  . Highest education level: Not on file  Occupational History  . Not on file  Tobacco Use  . Smoking status: Current Every Day Smoker    Packs/day: 0.25    Years: 15.00    Pack years: 3.75  . Smokeless tobacco: Never Used  Substance and Sexual Activity  . Alcohol use: No  . Drug use: No  . Sexual activity: Not on file  Other Topics Concern  . Not on file  Social History Narrative  . Not on file   Social Determinants of Health   Financial Resource Strain:   . Difficulty of Paying Living Expenses:   Food Insecurity:   . Worried About Programme researcher, broadcasting/film/video in the Last Year:   . Barista in the Last Year:   Transportation Needs:   . Freight forwarder (Medical):   Marland Kitchen Lack of Transportation (Non-Medical):   Physical Activity:   . Days of Exercise per Week:   . Minutes of Exercise per Session:   Stress:   . Feeling of Stress :   Social Connections:   . Frequency of Communication with Friends and Family:   . Frequency of Social Gatherings with Friends and Family:   . Attends Religious Services:   . Active Member of Clubs or Organizations:   . Attends Banker Meetings:   Marland Kitchen Marital Status:      Family History: The patient's family history includes Heart attack in his father.  ROS:   Please see the history of present illness.     All other systems reviewed and are negative.  EKGs/Labs/Other Studies Reviewed:    The following studies were reviewed today:   EKG:  EKG is ordered today.  The ekg ordered today demonstrates sinus bradycardia, rate 54, no ST/T abnormalities  TTE 08/20/19:  1. Left ventricular ejection fraction, by estimation, is 45 to 50%. The  left ventricle has mildly decreased function. The left ventricle  demonstrates regional wall motion abnormalities (see scoring  diagram/findings for description). There is moderate  left  ventricular hypertrophy. Left ventricular diastolic parameters are  consistent with Grade I diastolic dysfunction (impaired relaxation). There  is moderate hypokinesis of the left ventricular, basal-mid inferior wall.  2. Right ventricular systolic function is mildly reduced. The right  ventricular size is normal.  3. Left atrial size was severely dilated.  4. The mitral valve is abnormal. Mild mitral valve regurgitation.  5. The aortic valve is tricuspid. Aortic valve regurgitation is not  visualized.  6. The inferior vena cava  is dilated in size with >50% respiratory  variability, suggesting right atrial pressure of 8 mmHg.   Recent Labs: 08/18/2019: TSH 0.874 08/20/2019: ALT 30; BUN 13; Creatinine, Ser 1.01; Hemoglobin 11.5; Magnesium 1.8; Platelets 125; Potassium 4.0; Sodium 142  Recent Lipid Panel    Component Value Date/Time   CHOL 104 08/18/2019 2100   TRIG 62 08/18/2019 2100   HDL 28 (L) 08/18/2019 2100   CHOLHDL 3.7 08/18/2019 2100   VLDL 12 08/18/2019 2100   LDLCALC 64 08/18/2019 2100    Physical Exam:    VS:  BP 120/78   Pulse (!) 54   Ht 6\' 2"  (1.88 m)   Wt 292 lb 6.4 oz (132.6 kg)   SpO2 96%   BMI 37.54 kg/m     Wt Readings from Last 3 Encounters:  09/27/19 292 lb 6.4 oz (132.6 kg)  08/20/19 292 lb 6.4 oz (132.6 kg)  05/31/19 289 lb 14.5 oz (131.5 kg)     GEN:  Well nourished, well developed in no acute distress HEENT: Normal NECK: No JVD; No carotid bruits LYMPHATICS: No lymphadenopathy CARDIAC: Bradycardic, no murmurs, rubs, gallops RESPIRATORY:  Clear to auscultation without rales, wheezing or rhonchi  ABDOMEN: Soft, non-tender, non-distended MUSCULOSKELETAL:  No edema; No deformity  SKIN: Warm and dry NEUROLOGIC:  Alert and oriented x 3 PSYCHIATRIC:  Normal affect   ASSESSMENT:    1. Chest pain of uncertain etiology   2. CAD in native artery   3. S/P CABG (coronary artery bypass graft)   4. PAF (paroxysmal atrial fibrillation) (HCC)   5.  Claudication of lower extremity (HCC)   6. Systolic dysfunction   7. Essential hypertension   8. Tobacco use    PLAN:    CAD: Status post CABG in 2013.  Reports atypical chest pain. -Continue aspirin 81 mg daily -Continue rosuvastatin 40 mg daily -Continue carvedilol 12.5 mg twice daily -Evaluate for ischemia with Lexiscan Myoview  Systolic dysfunction: EF 45 to 50% on recent TTE.  Appears euvolemic on exam -Continue lisinopril 10 mg and coreg 12.5 mg BID -Evaluate for ischemia with Lexiscan Myoview as above  Claudication: Will check ABIs  Paroxysmal atrial fibrillation/atrial flutter: On Xarelto for anticoagulation.  Started on amiodarone during recent admission.  TTE on 08/20/2019 showed LVEF 45 to 50% -Continue Xarelto -Continue amiodarone 200 mg daily -Has appointment with Dr. 08/22/2019 tomorrow to discuss ablation  Hypertension: Appears controlled.  Continue Coreg 12.5 mg twice daily and lisinopril 10 mg daily  Tobacco use: Patient counseled on the risks of tobacco use and cessation strongly encouraged  OSA: Encourage CPAP use  Hyperlipidemia: On Crestor 20 mg daily.  LDL 64  RTC in 1 month   Medication Adjustments/Labs and Tests Ordered: Current medicines are reviewed at length with the patient today.  Concerns regarding medicines are outlined above.  Orders Placed This Encounter  Procedures  . MYOCARDIAL PERFUSION IMAGING  . EKG 12-Lead  . VAS Johney Frame ABI WITH/WO TBI  . VAS [place] LOWER EXTREMITY ARTERIAL DUPLEX   No orders of the defined types were placed in this encounter.   Patient Instructions  Medication Instructions:  Your physician recommends that you continue on your current medications as directed. Please refer to the Current Medication list given to you today.  Testing/Procedures: Your physician has requested that you have a lexiscan myoview. For further information please visit [place]. Please follow instruction sheet, as given.  Your physician  has requested that you have an ankle brachial index (ABI). During this  test an ultrasound and blood pressure cuff are used to evaluate the arteries that supply the arms and legs with blood. Allow thirty minutes for this exam. There are no restrictions or special instructions.  Follow-Up: At [facility], you and your health needs are our priority.  As part of our continuing mission to provide you with exceptional heart care, we have created designated Provider Care Teams.  These Care Teams include your primary Cardiologist (physician) and Advanced Practice Providers (APPs -  Physician Assistants and Nurse Practitioners) who all work together to provide you with the care you need, when you need it.  We recommend signing up for the patient portal called "MyChart".  Sign up information is provided on this After Visit Summary.  MyChart is used to connect with patients for Virtual Visits (Telemedicine).  Patients are able to view lab/test results, encounter notes, upcoming appointments, etc.  Non-urgent messages can be sent to your provider as well.   To learn more about what you can do with MyChart, go to [email or web address].    Your next appointment:   1 month(s)  The format for your next appointment:   In Person  Provider:   Oswaldo Milian, MD        Signed, Donato Heinz, MD  09/27/2019 8:37 AM    [place]

## 2019-09-27 ENCOUNTER — Ambulatory Visit (INDEPENDENT_AMBULATORY_CARE_PROVIDER_SITE_OTHER): Payer: No Typology Code available for payment source | Admitting: Cardiology

## 2019-09-27 ENCOUNTER — Encounter: Payer: Self-pay | Admitting: Cardiology

## 2019-09-27 ENCOUNTER — Other Ambulatory Visit: Payer: Self-pay

## 2019-09-27 VITALS — BP 120/78 | HR 54 | Ht 74.0 in | Wt 292.4 lb

## 2019-09-27 DIAGNOSIS — I480 Paroxysmal atrial fibrillation: Secondary | ICD-10-CM

## 2019-09-27 DIAGNOSIS — I2510 Atherosclerotic heart disease of native coronary artery without angina pectoris: Secondary | ICD-10-CM | POA: Diagnosis not present

## 2019-09-27 DIAGNOSIS — I739 Peripheral vascular disease, unspecified: Secondary | ICD-10-CM

## 2019-09-27 DIAGNOSIS — R079 Chest pain, unspecified: Secondary | ICD-10-CM | POA: Diagnosis not present

## 2019-09-27 DIAGNOSIS — I10 Essential (primary) hypertension: Secondary | ICD-10-CM

## 2019-09-27 DIAGNOSIS — Z951 Presence of aortocoronary bypass graft: Secondary | ICD-10-CM

## 2019-09-27 DIAGNOSIS — I519 Heart disease, unspecified: Secondary | ICD-10-CM

## 2019-09-27 DIAGNOSIS — Z720 Tobacco use: Secondary | ICD-10-CM

## 2019-09-27 NOTE — Patient Instructions (Signed)
Medication Instructions:  Your physician recommends that you continue on your current medications as directed. Please refer to the Current Medication list given to you today.  Testing/Procedures: Your physician has requested that you have a lexiscan myoview. For further information please visit [email or web address] Please follow instruction sheet, as given.  Your physician has requested that you have an ankle brachial index (ABI). During this test an ultrasound and blood pressure cuff are used to evaluate the arteries that supply the arms and legs with blood. Allow thirty minutes for this exam. There are no restrictions or special instructions.  Follow-Up: At [facility], you and your health needs are our priority.  As part of our continuing mission to provide you with exceptional heart care, we have created designated Provider Care Teams.  These Care Teams include your primary Cardiologist (physician) and Advanced Practice Providers (APPs -  Physician Assistants and Nurse Practitioners) who all work together to provide you with the care you need, when you need it.  We recommend signing up for the patient portal called "MyChart".  Sign up information is provided on this After Visit Summary.  MyChart is used to connect with patients for Virtual Visits (Telemedicine).  Patients are able to view lab/test results, encounter notes, upcoming appointments, etc.  Non-urgent messages can be sent to your provider as well.   To learn more about what you can do with MyChart, go to [email or web address].    Your next appointment:   1 month(s)  The format for your next appointment:   In Person  Provider:   Epifanio Lesches, MD

## 2019-09-28 ENCOUNTER — Telehealth: Payer: Self-pay

## 2019-09-28 ENCOUNTER — Telehealth (INDEPENDENT_AMBULATORY_CARE_PROVIDER_SITE_OTHER): Payer: No Typology Code available for payment source | Admitting: Internal Medicine

## 2019-09-28 ENCOUNTER — Encounter: Payer: Self-pay | Admitting: Internal Medicine

## 2019-09-28 DIAGNOSIS — I4819 Other persistent atrial fibrillation: Secondary | ICD-10-CM

## 2019-09-28 DIAGNOSIS — Z951 Presence of aortocoronary bypass graft: Secondary | ICD-10-CM

## 2019-09-28 DIAGNOSIS — D6869 Other thrombophilia: Secondary | ICD-10-CM | POA: Diagnosis not present

## 2019-09-28 DIAGNOSIS — G4733 Obstructive sleep apnea (adult) (pediatric): Secondary | ICD-10-CM

## 2019-09-28 DIAGNOSIS — I4891 Unspecified atrial fibrillation: Secondary | ICD-10-CM

## 2019-09-28 IMAGING — CR DG CHEST 2V
2 series · 2 of 2 positions shown · non-contrast
Comparison: July 12, 2016

CLINICAL DATA: Dizziness.  Atrial fibrillation

EXAM:
CHEST - 2 VIEW

[w chest pa *]
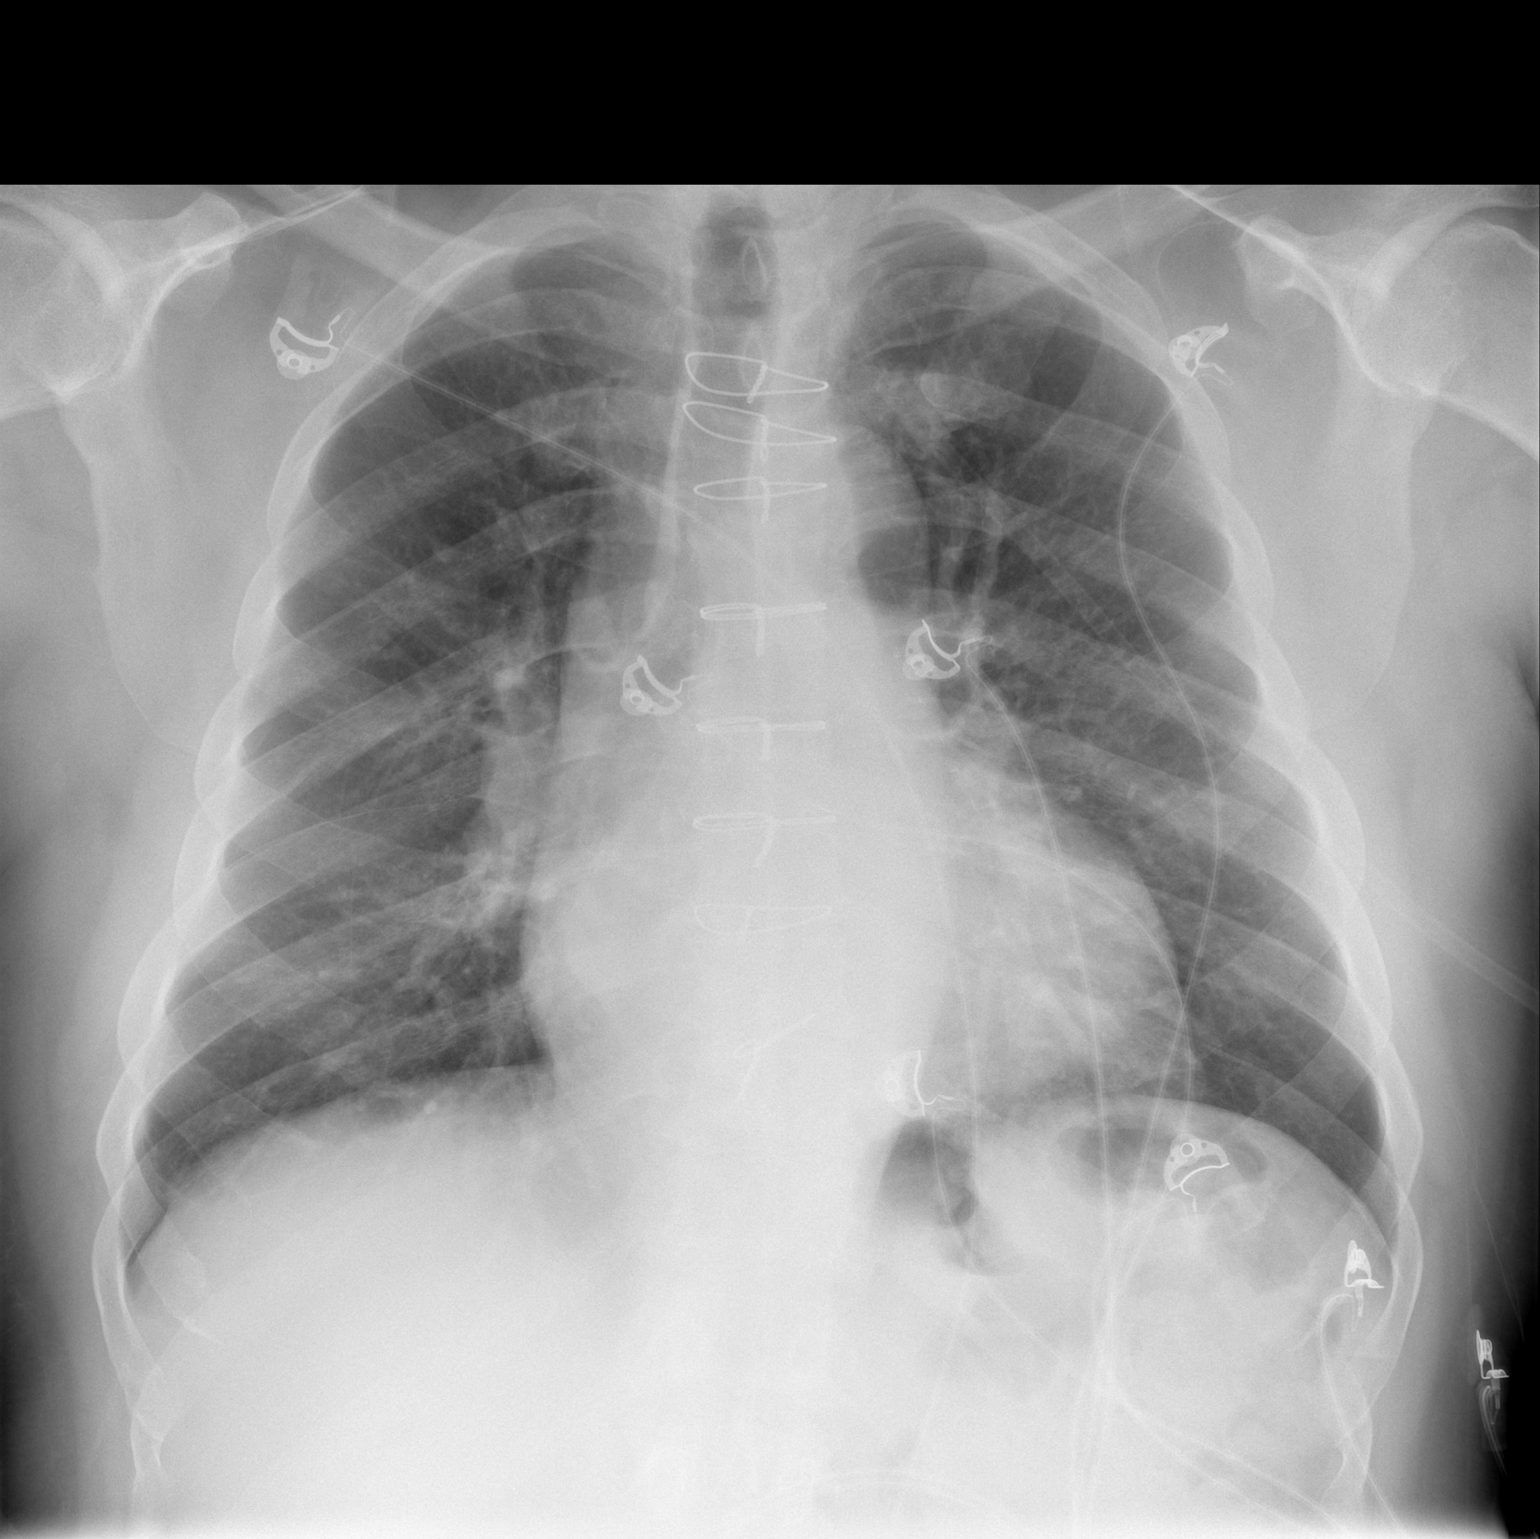

[w chest lat *]
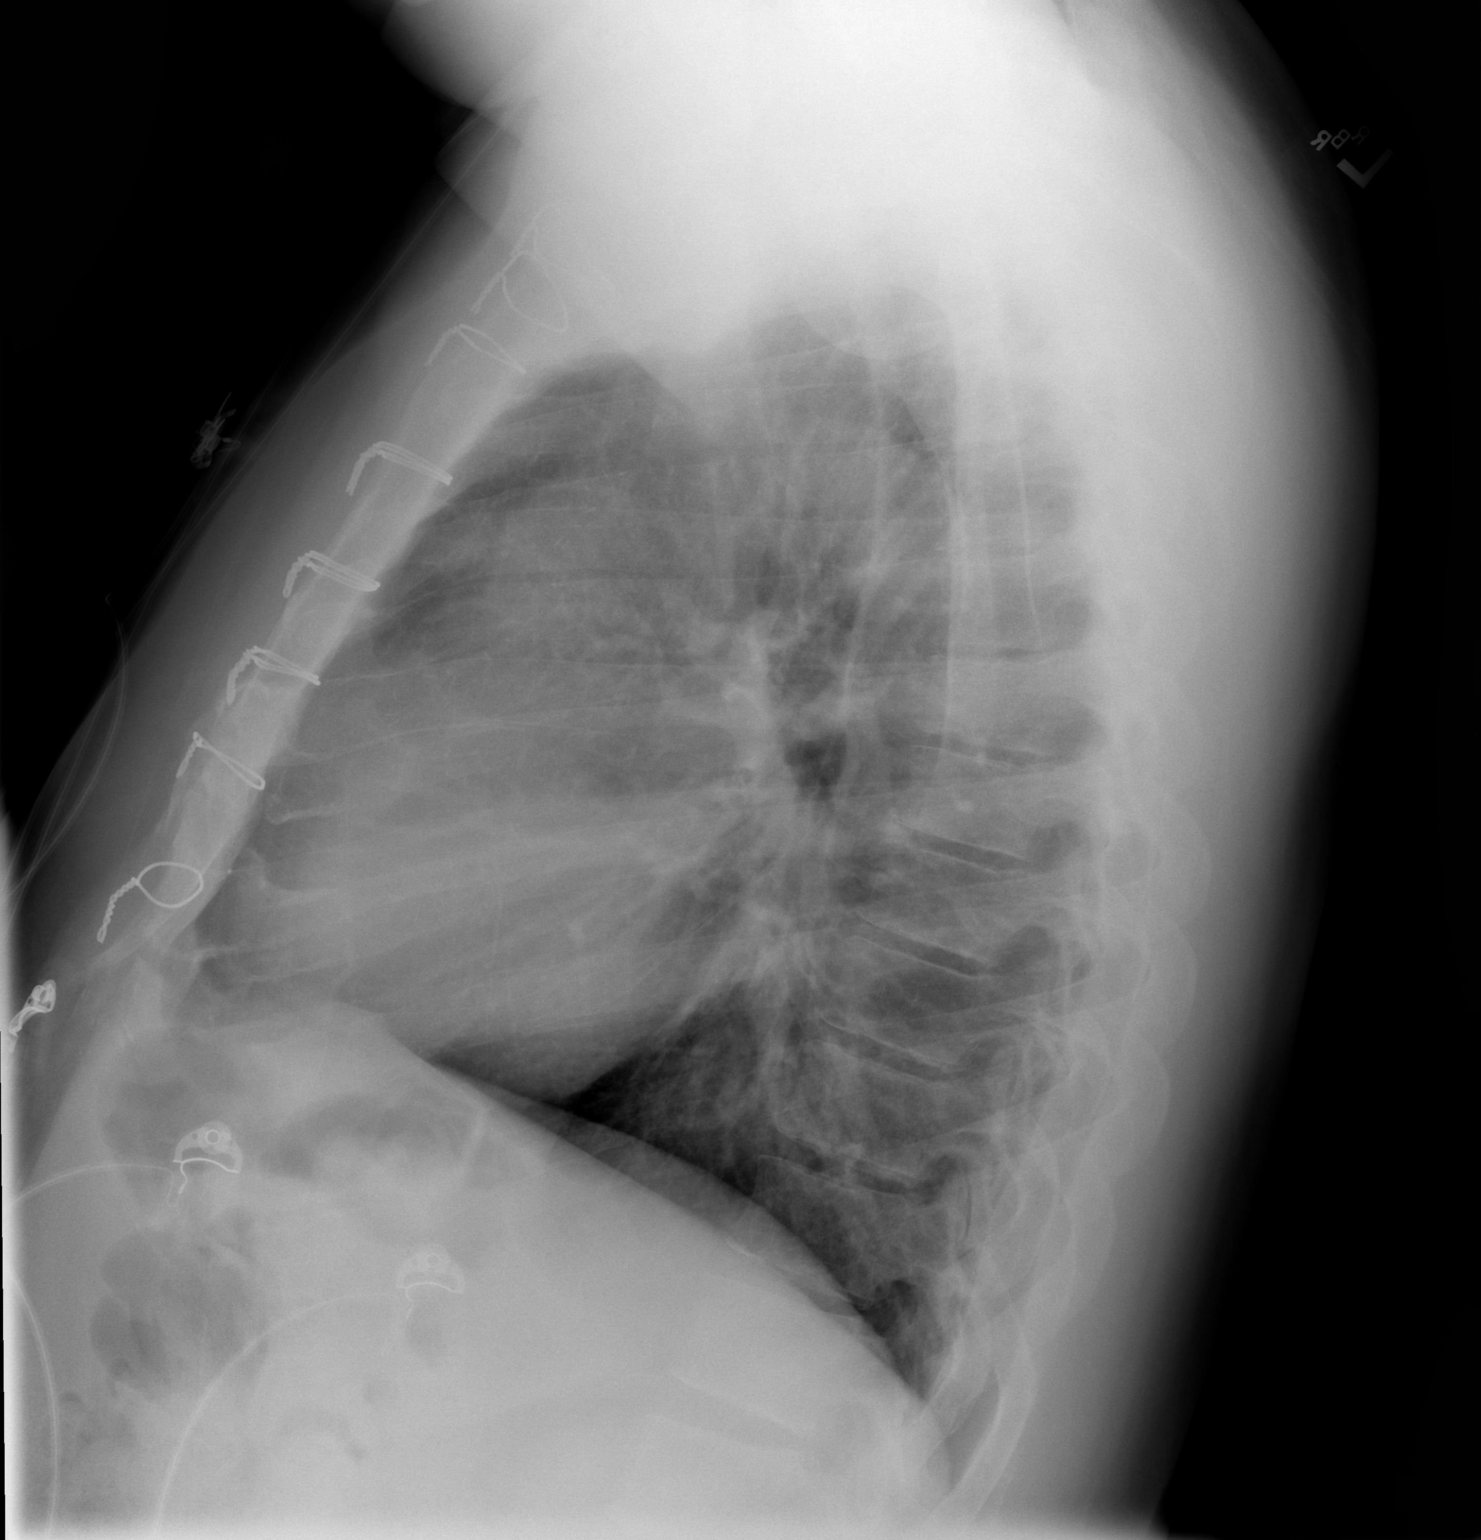

[2 of 2 positions shown; findings below may reference images not displayed]

FINDINGS: There is no edema or consolidation. Heart is upper normal in size
with pulmonary vascularity normal. No adenopathy. Patient is status
post median sternotomy. There is postoperative change in the lower
cervical region. No evident pneumothorax.
IMPRESSION: No edema or consolidation.  Stable cardiac silhouette.

## 2019-09-28 NOTE — Telephone Encounter (Signed)
Transferred call to [employer].

## 2019-09-28 NOTE — H&P (View-Only) (Signed)
Electrophysiology TeleHealth Note   Due to national recommendations of social distancing due to COVID 19, Audio telehealth visit is felt to be most appropriate for this patient at this time.  See MyChart message from today for patient consent regarding telehealth for [facility].   Date:  09/28/2019   ID:  Dione Plover, DOB 1955/06/21, MRN [id number]  Location: home  Provider location: [place] Evaluation Performed: New patient consult  PCP:  [name], MD  Cardiologist:  Kristeen Miss, MD  Electrophysiologist:  None   Chief Complaint:  afib  History of Present Illness:    [name] is a 64 y.o. male who presents via audio conferencing for a telehealth visit today.   The patient is referred for new consultation regarding afib/ atrial flutter by Janora Norlander from the [place]. The patient reports having afib for several years.  He has had increasing frequency and duration of atrial fibrillation since that time.  He estimates that he has been cardioverted 4 times in the past 2 years.   Most recently, he had atrial flutter 08/18/2019.  He was started on amiodarone. He is unaware of triggers/ precipitants for his afib.   During afib, he reports symptoms of fatigue, tachy palpitations, presyncope and SOB.  He has OSA but uses CPAP.  Today, he denies symptoms of chest pain, shortness of breath, orthopnea, PND, lower extremity edema,  dizziness, presyncope, syncope, bleeding, or neurologic sequela. The patient is tolerating medications without difficulties and is otherwise without complaint today.     Past Medical History:  Diagnosis Date  . Acute upper GI bleed   . Atrial fibrillation (HCC)   . Atrial flutter (HCC)   . Chronic anticoagulation   . Coronary artery disease   . Essential hypertension 08/18/2019  . Hyperlipidemia 08/18/2019  . Myocardial infarction (HCC)    unknown  . Obesity (BMI 30-39.9) 08/18/2019  . OSA on CPAP 08/18/2019  . Sleep apnea   .  Tobacco abuse 08/18/2019    Past Surgical History:  Procedure Laterality Date  . CERVICAL FUSION    . CORONARY ARTERY BYPASS GRAFT  2013   1  . CORONARY ARTERY BYPASS GRAFT     D[facility]  . ESOPHAGOGASTRODUODENOSCOPY (EGD) WITH PROPOFOL N/A 12/21/2016   Procedure: ESOPHAGOGASTRODUODENOSCOPY (EGD) WITH PROPOFOL;  Surgeon: Kathi Der, MD;  Location: [facility];  Service: Gastroenterology;  Laterality: N/A;  . TONSILLECTOMY    . TOTAL HIP ARTHROPLASTY Left 02/22/2019   Procedure: TOTAL HIP ARTHROPLASTY ANTERIOR APPROACH;  Surgeon: Samson Frederic, MD;  Location: [facility];  Service: Orthopedics;  Laterality: Left;    Current Outpatient Medications  Medication Sig Dispense Refill  . amiodarone (PACERONE) 200 MG tablet Take 2 tablets (400 mg total) by mouth daily. Take 2 tablets (400mg ) daily x2 weeks, then 1 tablet (200mg ) daily thereafter. 42 tablet 1  . Ascorbic Acid (VITAMIN C PO) Take 1 tablet by mouth daily.    aspirin EC 81 MG tablet Take 81 mg by mouth daily.    . carvedilol (COREG) 12.5 MG tablet Take 1 tablet (12.5 mg total) by mouth 2 (two) times daily with a meal. 60 tablet 1  . docusate sodium (COLACE) 100 MG capsule Take 1 capsule (100 mg total) by mouth 2 (two) times daily. 60 capsule 1  . lisinopril (ZESTRIL) 10 MG tablet Take 1 tablet (10 mg total) by mouth daily. 30 tablet 1  . Melatonin 3 MG CAPS Take 6 mg  by mouth at bedtime.    . nicotine (NICODERM CQ - DOSED IN MG/24 HR) 7 mg/24hr patch Place 1 patch (7 mg total) onto the skin daily. 28 patch 0  . pantoprazole (PROTONIX) 40 MG tablet Take 1 tablet (40 mg total) by mouth daily. 30 tablet 0  . rivaroxaban (XARELTO) 20 MG TABS tablet Take 20 mg by mouth daily with breakfast.     . rosuvastatin (CRESTOR) 40 MG tablet Take 40 mg by mouth at bedtime.     No current facility-administered medications for this visit.    Allergies:   Patient has no known allergies.   Social History:  The  patient  reports that he has been smoking. He has a 3.75 pack-year smoking history. He has never used smokeless tobacco. He reports that he does not drink alcohol or use drugs.   Family History:  The patient's family history includes Heart attack in his father.    ROS:  Please see the history of present illness.   All other systems are personally reviewed and negative.    Exam:    Vital Signs:  There were no vitals taken for this visit.   Well souding, alert and conversant    Labs/Other Tests and Data Reviewed:    Recent Labs: 08/18/2019: TSH 0.874 08/20/2019: ALT 30; BUN 13; Creatinine, Ser 1.01; Hemoglobin 11.5; Magnesium 1.8; Platelets 125; Potassium 4.0; Sodium 142   Wt Readings from Last 3 Encounters:  09/27/19 292 lb 6.4 oz (132.6 kg)  08/20/19 292 lb 6.4 oz (132.6 kg)  05/31/19 289 lb 14.5 oz (131.5 kg)     Other studies personally reviewed: Additional studies/ records that were reviewed today include: echo, recent hospital records, office notes  Review of the above records today demonstrates: as above  Echo 08/20/19- EF 45%, moderate LVH, severe LA enlargement  ASSESSMENT & PLAN:    1.  Persistent atrial fibrillation/ atrial flutter The patient has symptomatic, recurrent persistent atrial fibrillation and atypical atrial flutter. he worries about risks of amiodarone long term Chads2vasc score is 3.  he is anticoagulated with xarelto.  He has severe LA enlargement  He is aware that his anticipated success with ablation is reduced. Therapeutic strategies for afib including medicine and ablation were discussed in detail with the patient today. Risk, benefits, and alternatives to EP study and radiofrequency ablation for afib were also discussed in detail today. These risks include but are not limited to stroke, bleeding, vascular damage, tamponade, perforation, damage to the esophagus, lungs, and other structures, pulmonary vein stenosis, worsening renal function, and death.  The patient understands these risk and wishes to proceed.  We will therefore proceed with catheter ablation at the next available time.  Carto, ICE, anesthesia are requested for the procedure.  Will also obtain cardiac CT prior to the procedure to exclude LAA thrombus and further evaluate atrial anatomy.  2. OSA Compliance with CPAP is encouraged  3. Obesity I have reviewed the patients BMI and decreased success rates with ablation at length today.  Weight loss is strongly advised.  Per Guijian et al (PACE 2013; 36: 481-856), patients with BMI 25-29.9 (obese) have a 27% increase in AF recurrence post ablation.  Patients with BMI >30 have a 31% increase in AF recurrence post ablation when compared to those with BMI <25.  4. CAD No ischemic symptoms   Patient Risk:  after full review of this patients clinical status, I feel that they are at moderate risk at this time.  Today, I have spent 20 minutes with the patient with telehealth technology discussing afib .    Signed, Thompson Grayer MD, [facility] 09/28/2019 12:51 PM   [place] [id number] [phone] (office) [phone] (fax)

## 2019-09-28 NOTE — Progress Notes (Signed)
Electrophysiology TeleHealth Note   Due to national recommendations of social distancing due to COVID 19, Audio telehealth visit is felt to be most appropriate for this patient at this time.  See MyChart message from today for patient consent regarding telehealth for [facility].   Date:  09/28/2019   ID:  Cameron Schultz, DOB 1955/06/21, MRN [id number]  Location: home  Provider location: [place] Evaluation Performed: New patient consult  PCP:  [name], MD  Cardiologist:  Kristeen Miss, MD  Electrophysiologist:  None   Chief Complaint:  afib  History of Present Illness:    Cameron Schultz is a 64 y.o. male who presents via audio conferencing for a telehealth visit today.   The patient is referred for new consultation regarding afib/ atrial flutter by Janora Norlander from the [place]. The patient reports having afib for several years.  He has had increasing frequency and duration of atrial fibrillation since that time.  He estimates that he has been cardioverted 4 times in the past 2 years.   Most recently, he had atrial flutter 08/18/2019.  He was started on amiodarone. He is unaware of triggers/ precipitants for his afib.   During afib, he reports symptoms of fatigue, tachy palpitations, presyncope and SOB.  He has OSA but uses CPAP.  Today, he denies symptoms of chest pain, shortness of breath, orthopnea, PND, lower extremity edema,  dizziness, presyncope, syncope, bleeding, or neurologic sequela. The patient is tolerating medications without difficulties and is otherwise without complaint today.     Past Medical History:  Diagnosis Date  . Acute upper GI bleed   . Atrial fibrillation (HCC)   . Atrial flutter (HCC)   . Chronic anticoagulation   . Coronary artery disease   . Essential hypertension 08/18/2019  . Hyperlipidemia 08/18/2019  . Myocardial infarction (HCC)    unknown  . Obesity (BMI 30-39.9) 08/18/2019  . OSA on CPAP 08/18/2019  . Sleep apnea   .  Tobacco abuse 08/18/2019    Past Surgical History:  Procedure Laterality Date  . CERVICAL FUSION    . CORONARY ARTERY BYPASS GRAFT  2013   1  . CORONARY ARTERY BYPASS GRAFT     DONE IN [facility]  . ESOPHAGOGASTRODUODENOSCOPY (EGD) WITH PROPOFOL N/A 12/21/2016   Procedure: ESOPHAGOGASTRODUODENOSCOPY (EGD) WITH PROPOFOL;  Surgeon: Kathi Der, MD;  [facility];  Service: Gastroenterology;  Laterality: N/A;  . TONSILLECTOMY    . TOTAL HIP ARTHROPLASTY Left 02/22/2019   Procedure: TOTAL HIP ARTHROPLASTY ANTERIOR APPROACH;  Surgeon: Samson Frederic, MD;  Location: [facility];  Service: Orthopedics;  Laterality: Left;    Current Outpatient Medications  Medication Sig Dispense Refill  . amiodarone (PACERONE) 200 MG tablet Take 2 tablets (400 mg total) by mouth daily. Take 2 tablets (400mg ) daily x2 weeks, then 1 tablet (200mg ) daily thereafter. 42 tablet 1  . Ascorbic Acid (VITAMIN C PO) Take 1 tablet by mouth daily.    aspirin EC 81 MG tablet Take 81 mg by mouth daily.    . carvedilol (COREG) 12.5 MG tablet Take 1 tablet (12.5 mg total) by mouth 2 (two) times daily with a meal. 60 tablet 1  . docusate sodium (COLACE) 100 MG capsule Take 1 capsule (100 mg total) by mouth 2 (two) times daily. 60 capsule 1  . lisinopril (ZESTRIL) 10 MG tablet Take 1 tablet (10 mg total) by mouth daily. 30 tablet 1  . Melatonin 3 MG CAPS Take 6 mg  by mouth at bedtime.    . nicotine (NICODERM CQ - DOSED IN MG/24 HR) 7 mg/24hr patch Place 1 patch (7 mg total) onto the skin daily. 28 patch 0  . pantoprazole (PROTONIX) 40 MG tablet Take 1 tablet (40 mg total) by mouth daily. 30 tablet 0  . rivaroxaban (XARELTO) 20 MG TABS tablet Take 20 mg by mouth daily with breakfast.     . rosuvastatin (CRESTOR) 40 MG tablet Take 40 mg by mouth at bedtime.     No current facility-administered medications for this visit.    Allergies:   Patient has no known allergies.   Social History:  The  patient  reports that he has been smoking. He has a 3.75 pack-year smoking history. He has never used smokeless tobacco. He reports that he does not drink alcohol or use drugs.   Family History:  The patient's family history includes Heart attack in his father.    ROS:  Please see the history of present illness.   All other systems are personally reviewed and negative.    Exam:    Vital Signs:  There were no vitals taken for this visit.   Well souding, alert and conversant    Labs/Other Tests and Data Reviewed:    Recent Labs: 08/18/2019: TSH 0.874 08/20/2019: ALT 30; BUN 13; Creatinine, Ser 1.01; Hemoglobin 11.5; Magnesium 1.8; Platelets 125; Potassium 4.0; Sodium 142   Wt Readings from Last 3 Encounters:  09/27/19 292 lb 6.4 oz (132.6 kg)  08/20/19 292 lb 6.4 oz (132.6 kg)  05/31/19 289 lb 14.5 oz (131.5 kg)     Other studies personally reviewed: Additional studies/ records that were reviewed today include: echo, recent hospital records, office notes  Review of the above records today demonstrates: as above  Echo 08/20/19- EF 45%, moderate LVH, severe LA enlargement  ASSESSMENT & PLAN:    1.  Persistent atrial fibrillation/ atrial flutter The patient has symptomatic, recurrent persistent atrial fibrillation and atypical atrial flutter. he worries about risks of amiodarone long term Chads2vasc score is 3.  he is anticoagulated with xarelto.  He has severe LA enlargement  He is aware that his anticipated success with ablation is reduced. Therapeutic strategies for afib including medicine and ablation were discussed in detail with the patient today. Risk, benefits, and alternatives to EP study and radiofrequency ablation for afib were also discussed in detail today. These risks include but are not limited to stroke, bleeding, vascular damage, tamponade, perforation, damage to the esophagus, lungs, and other structures, pulmonary vein stenosis, worsening renal function, and death.  The patient understands these risk and wishes to proceed.  We will therefore proceed with catheter ablation at the next available time.  Carto, ICE, anesthesia are requested for the procedure.  Will also obtain cardiac CT prior to the procedure to exclude LAA thrombus and further evaluate atrial anatomy.  2. OSA Compliance with CPAP is encouraged  3. Obesity I have reviewed the patients BMI and decreased success rates with ablation at length today.  Weight loss is strongly advised.  Per Guijian et al (PACE 2013; 36: 748-756), patients with BMI 25-29.9 (obese) have a 27% increase in AF recurrence post ablation.  Patients with BMI >30 have a 31% increase in AF recurrence post ablation when compared to those with BMI <25.  4. CAD No ischemic symptoms   Patient Risk:  after full review of this patients clinical status, I feel that they are at moderate risk at this time.     Today, I have spent 20 minutes with the patient with telehealth technology discussing afib .    Signed, Thompson Grayer MD, [facility] 09/28/2019 12:51 PM   [place] [address] [phone] (office) [phone] (fax)

## 2019-09-28 NOTE — Telephone Encounter (Signed)
Call back received from Pt.  Pt scheduled for afib ablation on Oct 23, 2019  Will come in for labs and instruction May 5

## 2019-09-28 NOTE — Telephone Encounter (Signed)
[place] [facility] [place] [place]

## 2019-09-28 NOTE — Telephone Encounter (Signed)
LMTCB

## 2019-09-28 NOTE — Telephone Encounter (Signed)
-----   Message from Hillis Range, MD sent at 09/28/2019 12:56 PM EDT ----- Afib ablation  CIA  Cardiac CT

## 2019-10-01 NOTE — Telephone Encounter (Signed)
OV note faxed to [employer] PA at [place].  Work up complete  Will meet with Pt on 5/5 to go over instructions.

## 2019-10-03 ENCOUNTER — Other Ambulatory Visit: Payer: Self-pay

## 2019-10-03 ENCOUNTER — Other Ambulatory Visit: Payer: No Typology Code available for payment source | Admitting: *Deleted

## 2019-10-03 DIAGNOSIS — I4819 Other persistent atrial fibrillation: Secondary | ICD-10-CM

## 2019-10-03 DIAGNOSIS — I4891 Unspecified atrial fibrillation: Secondary | ICD-10-CM

## 2019-10-03 LAB — BASIC METABOLIC PANEL
BUN/Creatinine Ratio: 15 (ref 10–24)
BUN: 16 mg/dL (ref 8–27)
CO2: 23 mmol/L (ref 20–29)
Calcium: 8.9 mg/dL (ref 8.6–10.2)
Chloride: 106 mmol/L (ref 96–106)
Creatinine, Ser: 1.07 mg/dL (ref 0.76–1.27)
GFR calc Af Amer: 85 mL/min/{1.73_m2} (ref >59)
GFR calc non Af Amer: 73 mL/min/{1.73_m2} (ref >59)
Glucose: 94 mg/dL (ref 65–99)
Potassium: 3.9 mmol/L (ref 3.5–5.2)
Sodium: 139 mmol/L (ref 134–144)

## 2019-10-03 LAB — CBC WITH DIFFERENTIAL/PLATELET
Basophils Absolute: 0 10*3/uL (ref 0.0–0.2)
Basos: 0 %
EOS (ABSOLUTE): 0.1 10*3/uL (ref 0.0–0.4)
Eos: 3 %
Hematocrit: 40.2 % (ref 37.5–51.0)
Hemoglobin: 13.7 g/dL (ref 13.0–17.7)
Lymphocytes Absolute: 2 10*3/uL (ref 0.7–3.1)
Lymphs: 38 %
MCH: 28.8 pg (ref 26.6–33.0)
MCHC: 34.1 g/dL (ref 31.5–35.7)
MCV: 85 fL (ref 79–97)
Monocytes Absolute: 0.6 10*3/uL (ref 0.1–0.9)
Monocytes: 11 %
Neutrophils Absolute: 2.5 10*3/uL (ref 1.4–7.0)
Neutrophils: 48 %
Platelets: 142 10*3/uL — ABNORMAL LOW (ref 150–450)
RBC: 4.75 x10E6/uL (ref 4.14–5.80)
RDW: 18.4 % — ABNORMAL HIGH (ref 11.6–15.4)
WBC: 5.3 10*3/uL (ref 3.4–10.8)

## 2019-10-04 ENCOUNTER — Ambulatory Visit (HOSPITAL_COMMUNITY): Payer: No Typology Code available for payment source

## 2019-10-04 ENCOUNTER — Telehealth (HOSPITAL_COMMUNITY): Payer: Self-pay

## 2019-10-04 NOTE — Telephone Encounter (Signed)
Encounter complete. 

## 2019-10-09 ENCOUNTER — Other Ambulatory Visit: Payer: Self-pay

## 2019-10-09 ENCOUNTER — Ambulatory Visit (HOSPITAL_COMMUNITY)
Admission: RE | Admit: 2019-10-09 | Discharge: 2019-10-09 | Disposition: A | Discharge status: Home / Self Care | Payer: No Typology Code available for payment source | Source: Ambulatory Visit | Attending: Cardiology | Admitting: Cardiology

## 2019-10-09 DIAGNOSIS — Z951 Presence of aortocoronary bypass graft: Secondary | ICD-10-CM | POA: Diagnosis present

## 2019-10-09 DIAGNOSIS — R079 Chest pain, unspecified: Secondary | ICD-10-CM

## 2019-10-09 DIAGNOSIS — I2510 Atherosclerotic heart disease of native coronary artery without angina pectoris: Secondary | ICD-10-CM | POA: Diagnosis not present

## 2019-10-09 DIAGNOSIS — I480 Paroxysmal atrial fibrillation: Secondary | ICD-10-CM

## 2019-10-09 MED ORDER — TECHNETIUM TC 99M TETROFOSMIN IV KIT
27.9000 | PACK | Freq: Once | INTRAVENOUS | Status: AC | PRN
  Administered 2019-10-09: 27.9 via INTRAVENOUS
  Filled 2019-10-09: qty 28

## 2019-10-09 MED ORDER — REGADENOSON 0.4 MG/5ML IV SOLN
0.4000 mg | Freq: Once | INTRAVENOUS | Status: AC
  Administered 2019-10-09: 0.4 mg via INTRAVENOUS

## 2019-10-10 ENCOUNTER — Ambulatory Visit (HOSPITAL_COMMUNITY)
Admission: RE | Admit: 2019-10-10 | Discharge: 2019-10-10 | Disposition: A | Discharge status: Home / Self Care | Payer: No Typology Code available for payment source | Source: Ambulatory Visit | Attending: Cardiovascular Disease | Admitting: Cardiovascular Disease

## 2019-10-10 LAB — MYOCARDIAL PERFUSION IMAGING
LV dias vol: 171 mL (ref 62–150)
LV sys vol: 108 mL
Peak HR: 77 {beats}/min
Rest HR: 64 {beats}/min
SDS: 5
SRS: 11
SSS: 16
TID: 0.84

## 2019-10-10 MED ORDER — TECHNETIUM TC 99M TETROFOSMIN IV KIT
28.0000 | PACK | Freq: Once | INTRAVENOUS | Status: AC | PRN
  Administered 2019-10-10: 28 via INTRAVENOUS

## 2019-10-11 ENCOUNTER — Other Ambulatory Visit: Payer: Self-pay

## 2019-10-11 ENCOUNTER — Ambulatory Visit (HOSPITAL_COMMUNITY)
Admission: RE | Admit: 2019-10-11 | Discharge: 2019-10-11 | Disposition: A | Discharge status: Home / Self Care | Payer: No Typology Code available for payment source | Source: Ambulatory Visit | Attending: Cardiology | Admitting: Cardiology

## 2019-10-11 DIAGNOSIS — I739 Peripheral vascular disease, unspecified: Secondary | ICD-10-CM | POA: Diagnosis present

## 2019-10-15 ENCOUNTER — Telehealth: Payer: Self-pay | Admitting: Cardiology

## 2019-10-15 NOTE — Telephone Encounter (Signed)
Patient called to find out if at last visit Dr. Bjorn Pippin advised him to stop taking lisinopril (ZESTRIL) 10 MG tablet.  Advised him according to office note I did not see he was told to stop taking it.

## 2019-10-18 ENCOUNTER — Telehealth (HOSPITAL_COMMUNITY): Payer: Self-pay | Admitting: *Deleted

## 2019-10-18 NOTE — Telephone Encounter (Signed)

## 2019-10-19 ENCOUNTER — Encounter (HOSPITAL_COMMUNITY): Payer: Self-pay

## 2019-10-19 ENCOUNTER — Ambulatory Visit (HOSPITAL_COMMUNITY)
Admission: RE | Admit: 2019-10-19 | Discharge: 2019-10-19 | Disposition: A | Discharge status: Home / Self Care | Payer: No Typology Code available for payment source | Source: Ambulatory Visit | Attending: Internal Medicine | Admitting: Internal Medicine

## 2019-10-19 ENCOUNTER — Other Ambulatory Visit: Payer: Self-pay

## 2019-10-19 DIAGNOSIS — I4891 Unspecified atrial fibrillation: Secondary | ICD-10-CM | POA: Diagnosis not present

## 2019-10-19 MED ORDER — IOHEXOL 350 MG/ML SOLN
80.0000 mL | Freq: Once | INTRAVENOUS | Status: AC | PRN
  Administered 2019-10-19: 80 mL via INTRAVENOUS

## 2019-10-20 ENCOUNTER — Other Ambulatory Visit (HOSPITAL_COMMUNITY)
Admission: RE | Admit: 2019-10-20 | Discharge: 2019-10-20 | Disposition: A | Discharge status: Home / Self Care | Payer: No Typology Code available for payment source | Source: Ambulatory Visit | Attending: Internal Medicine | Admitting: Internal Medicine

## 2019-10-20 DIAGNOSIS — Z01812 Encounter for preprocedural laboratory examination: Secondary | ICD-10-CM | POA: Insufficient documentation

## 2019-10-20 DIAGNOSIS — Z20822 Contact with and (suspected) exposure to covid-19: Secondary | ICD-10-CM | POA: Insufficient documentation

## 2019-10-20 LAB — SARS CORONAVIRUS 2 (TAT 6-24 HRS): SARS Coronavirus 2: NEGATIVE

## 2019-10-22 ENCOUNTER — Telehealth: Payer: Self-pay | Admitting: Internal Medicine

## 2019-10-22 NOTE — Telephone Encounter (Signed)
Pt c/o medication issue:  1. Name of Medication:  rivaroxaban (XARELTO) 20 MG TABS tablet    2. How are you currently taking this medication (dosage and times per day)? As directed  3. Are you having a reaction (difficulty breathing--STAT)? no  4. What is your medication issue? Patient has a procedure tomorrow. The instructions state to not take any medications prior to the procedure but then it states to make sure you take do not miss any doses of your Xarelto. Patient is a little confused and would like some clarification.

## 2019-10-22 NOTE — Telephone Encounter (Signed)
I spoke with patient and told him he should not take any medications the morning of the procedure. He is aware not to eat or drink after midnight prior to the procedure.

## 2019-10-23 ENCOUNTER — Ambulatory Visit (HOSPITAL_COMMUNITY)
Admission: RE | Admit: 2019-10-23 | Discharge: 2019-10-23 | Disposition: A | Discharge status: Home / Self Care | Payer: No Typology Code available for payment source | Attending: Internal Medicine | Admitting: Internal Medicine

## 2019-10-23 ENCOUNTER — Encounter (HOSPITAL_COMMUNITY)
Admission: RE | Disposition: A | Discharge status: Home / Self Care | Payer: Self-pay | Source: Home / Self Care | Attending: Internal Medicine

## 2019-10-23 ENCOUNTER — Ambulatory Visit (HOSPITAL_COMMUNITY): Payer: No Typology Code available for payment source | Admitting: Anesthesiology

## 2019-10-23 ENCOUNTER — Other Ambulatory Visit: Payer: Self-pay

## 2019-10-23 DIAGNOSIS — Z8249 Family history of ischemic heart disease and other diseases of the circulatory system: Secondary | ICD-10-CM | POA: Insufficient documentation

## 2019-10-23 DIAGNOSIS — I483 Typical atrial flutter: Secondary | ICD-10-CM | POA: Diagnosis present

## 2019-10-23 DIAGNOSIS — E785 Hyperlipidemia, unspecified: Secondary | ICD-10-CM | POA: Diagnosis not present

## 2019-10-23 DIAGNOSIS — I4819 Other persistent atrial fibrillation: Secondary | ICD-10-CM | POA: Insufficient documentation

## 2019-10-23 DIAGNOSIS — I4891 Unspecified atrial fibrillation: Secondary | ICD-10-CM

## 2019-10-23 DIAGNOSIS — Z951 Presence of aortocoronary bypass graft: Secondary | ICD-10-CM | POA: Insufficient documentation

## 2019-10-23 DIAGNOSIS — Z6836 Body mass index (BMI) 36.0-36.9, adult: Secondary | ICD-10-CM | POA: Diagnosis not present

## 2019-10-23 DIAGNOSIS — I2510 Atherosclerotic heart disease of native coronary artery without angina pectoris: Secondary | ICD-10-CM | POA: Insufficient documentation

## 2019-10-23 DIAGNOSIS — Z79899 Other long term (current) drug therapy: Secondary | ICD-10-CM | POA: Insufficient documentation

## 2019-10-23 DIAGNOSIS — E669 Obesity, unspecified: Secondary | ICD-10-CM | POA: Insufficient documentation

## 2019-10-23 DIAGNOSIS — I252 Old myocardial infarction: Secondary | ICD-10-CM | POA: Diagnosis not present

## 2019-10-23 DIAGNOSIS — F17200 Nicotine dependence, unspecified, uncomplicated: Secondary | ICD-10-CM | POA: Diagnosis not present

## 2019-10-23 DIAGNOSIS — G4733 Obstructive sleep apnea (adult) (pediatric): Secondary | ICD-10-CM | POA: Insufficient documentation

## 2019-10-23 DIAGNOSIS — Z96642 Presence of left artificial hip joint: Secondary | ICD-10-CM | POA: Diagnosis not present

## 2019-10-23 DIAGNOSIS — Z7982 Long term (current) use of aspirin: Secondary | ICD-10-CM | POA: Insufficient documentation

## 2019-10-23 DIAGNOSIS — Z7901 Long term (current) use of anticoagulants: Secondary | ICD-10-CM | POA: Insufficient documentation

## 2019-10-23 DIAGNOSIS — I10 Essential (primary) hypertension: Secondary | ICD-10-CM | POA: Insufficient documentation

## 2019-10-23 HISTORY — PX: ATRIAL FIBRILLATION ABLATION: EP1191

## 2019-10-23 LAB — POCT ACTIVATED CLOTTING TIME
Activated Clotting Time: 279 seconds
Activated Clotting Time: 301 seconds

## 2019-10-23 SURGERY — ATRIAL FIBRILLATION ABLATION
Anesthesia: General

## 2019-10-23 MED ORDER — HEPARIN (PORCINE) IN NACL 1000-0.9 UT/500ML-% IV SOLN
INTRAVENOUS | Status: DC | PRN
  Administered 2019-10-23: 500 mL

## 2019-10-23 MED ORDER — FENTANYL CITRATE (PF) 100 MCG/2ML IJ SOLN
INTRAMUSCULAR | Status: DC | PRN
  Administered 2019-10-23 (×2): 50 ug via INTRAVENOUS

## 2019-10-23 MED ORDER — SODIUM CHLORIDE 0.9 % IV SOLN: INTRAVENOUS | Status: DC

## 2019-10-23 MED ORDER — SODIUM CHLORIDE 0.9 % IV SOLN: 250.0000 mL | INTRAVENOUS | Status: DC | PRN

## 2019-10-23 MED ORDER — EPHEDRINE SULFATE 50 MG/ML IJ SOLN
INTRAMUSCULAR | Status: DC | PRN
  Administered 2019-10-23: 15 mg via INTRAVENOUS
  Administered 2019-10-23: 10 mg via INTRAVENOUS

## 2019-10-23 MED ORDER — HEPARIN SODIUM (PORCINE) 1000 UNIT/ML IJ SOLN
INTRAMUSCULAR | Status: AC
  Filled 2019-10-23: qty 1

## 2019-10-23 MED ORDER — HEPARIN SODIUM (PORCINE) 1000 UNIT/ML IJ SOLN
INTRAMUSCULAR | Status: DC | PRN
  Administered 2019-10-23: 1000 [IU] via INTRAVENOUS
  Administered 2019-10-23: 14000 [IU] via INTRAVENOUS

## 2019-10-23 MED ORDER — DEXAMETHASONE SODIUM PHOSPHATE 10 MG/ML IJ SOLN
INTRAMUSCULAR | Status: DC | PRN
Start: 2019-10-23 — End: 2019-10-23
  Administered 2019-10-23: 10 mg via INTRAVENOUS

## 2019-10-23 MED ORDER — PROPOFOL 10 MG/ML IV BOLUS
INTRAVENOUS | Status: DC | PRN
  Administered 2019-10-23: 100 mg via INTRAVENOUS

## 2019-10-23 MED ORDER — PROTAMINE SULFATE 10 MG/ML IV SOLN
INTRAVENOUS | Status: DC | PRN
  Administered 2019-10-23: 40 mg via INTRAVENOUS

## 2019-10-23 MED ORDER — ROCURONIUM BROMIDE 10 MG/ML (PF) SYRINGE
PREFILLED_SYRINGE | INTRAVENOUS | Status: DC | PRN
  Administered 2019-10-23: 70 mg via INTRAVENOUS

## 2019-10-23 MED ORDER — PHENYLEPHRINE HCL-NACL 10-0.9 MG/250ML-% IV SOLN
INTRAVENOUS | Status: DC | PRN
  Administered 2019-10-23: 40 ug/min via INTRAVENOUS

## 2019-10-23 MED ORDER — ONDANSETRON HCL 4 MG/2ML IJ SOLN
INTRAMUSCULAR | Status: DC | PRN
  Administered 2019-10-23: 4 mg via INTRAVENOUS

## 2019-10-23 MED ORDER — LIDOCAINE 2% (20 MG/ML) 5 ML SYRINGE
INTRAMUSCULAR | Status: DC | PRN
  Administered 2019-10-23: 40 mg via INTRAVENOUS

## 2019-10-23 MED ORDER — RIVAROXABAN 20 MG PO TABS: 20.0000 mg | ORAL_TABLET | Freq: Once | ORAL | Status: DC

## 2019-10-23 MED ORDER — HEPARIN (PORCINE) IN NACL 1000-0.9 UT/500ML-% IV SOLN
INTRAVENOUS | Status: AC
  Filled 2019-10-23: qty 500

## 2019-10-23 MED ORDER — HEPARIN SODIUM (PORCINE) 1000 UNIT/ML IJ SOLN
INTRAMUSCULAR | Status: DC | PRN
Start: 2019-10-23 — End: 2019-10-23
  Administered 2019-10-23: 4000 [IU] via INTRAVENOUS
  Administered 2019-10-23: 3000 [IU] via INTRAVENOUS

## 2019-10-23 MED ORDER — MIDAZOLAM HCL 5 MG/5ML IJ SOLN
INTRAMUSCULAR | Status: DC | PRN
  Administered 2019-10-23: 2 mg via INTRAVENOUS

## 2019-10-23 MED ORDER — SODIUM CHLORIDE 0.9% FLUSH: 3.0000 mL | Freq: Two times a day (BID) | INTRAVENOUS | Status: DC

## 2019-10-23 MED ORDER — SUGAMMADEX SODIUM 200 MG/2ML IV SOLN
INTRAVENOUS | Status: DC | PRN
  Administered 2019-10-23: 200 mg via INTRAVENOUS

## 2019-10-23 SURGICAL SUPPLY — 21 items
BLANKET WARM UNDERBOD FULL ACC (MISCELLANEOUS) ×3 IMPLANT
CATH 8FR REPROCESSED SOUNDSTAR (CATHETERS) ×3 IMPLANT
CATH 8FR SOUNDSTAR REPROCESSED (CATHETERS) IMPLANT
CATH MAPPNG PENTARAY F 2-6-2MM (CATHETERS) IMPLANT
CATH SMTCH THERMOCOOL SF DF (CATHETERS) ×2 IMPLANT
CATH WEBSTER BI DIR CS D-F CRV (CATHETERS) ×2 IMPLANT
COVER SWIFTLINK CONNECTOR (BAG) ×3 IMPLANT
DEVICE CLOSURE PERCLS PRGLD 6F (VASCULAR PRODUCTS) IMPLANT
NDL BAYLIS TRANSSEPTAL 71CM (NEEDLE) IMPLANT
NEEDLE BAYLIS TRANSSEPTAL 71CM (NEEDLE) ×3 IMPLANT
PACK EP LATEX FREE (CUSTOM PROCEDURE TRAY) ×2
PACK EP LF (CUSTOM PROCEDURE TRAY) ×1 IMPLANT
PAD PRO RADIOLUCENT 2001M-C (PAD) ×3 IMPLANT
PATCH CARTO3 (PAD) ×2 IMPLANT
PENTARAY F 2-6-2MM (CATHETERS) ×3
PERCLOSE PROGLIDE 6F (VASCULAR PRODUCTS) ×9
SHEATH PINNACLE 7F 10CM (SHEATH) ×4 IMPLANT
SHEATH PINNACLE 9F 10CM (SHEATH) ×2 IMPLANT
SHEATH PROBE COVER 6X72 (BAG) ×2 IMPLANT
SHEATH SWARTZ TS SL2 63CM 8.5F (SHEATH) ×2 IMPLANT
TUBING SMART ABLATE COOLFLOW (TUBING) ×2 IMPLANT

## 2019-10-23 NOTE — Anesthesia Preprocedure Evaluation (Addendum)
Anesthesia Evaluation  Patient identified by MRN, date of birth, ID band Patient awake    Reviewed: Allergy & Precautions, NPO status , Patient's Chart, lab work & pertinent test results, reviewed documented beta blocker date and time   History of Anesthesia Complications Negative for: history of anesthetic complications  Airway Mallampati: II  TM Distance: >3 FB Neck ROM: Full    Dental  (+) Missing, Dental Advisory Given   Pulmonary sleep apnea and Continuous Positive Airway Pressure Ventilation , Current Smoker and Patient abstained from smoking.,  10/20/2019 SARS coronavirus NEG   breath sounds clear to auscultation       Cardiovascular hypertension, Pt. on medications and Pt. on home beta blockers (-) angina+ CAD and + CABG  + dysrhythmias Atrial Fibrillation  Rhythm:Irregular Rate:Normal  10/10/2019 Stress: There is a medium size (6-8% of LV), severe, fixed perfusion defect present in the basal to mid inferolateral segments consistent with prior infarction.  2. There is no ischemia. 3. Dilated LV with reduced LVEF, 37%.  4. This is an intermediate risk study  07/2019 ECHO: EF 40-45%, moderate hypokinesis of the left ventricular, basal-mid inferior wall, mild MR   Neuro/Psych negative neurological ROS     GI/Hepatic Neg liver ROS, GERD  Controlled,  Endo/Other  Morbid obesity  Renal/GU negative Renal ROS     Musculoskeletal   Abdominal (+) + obese,   Peds  Hematology negative hematology ROS (+) xarelto   Anesthesia Other Findings   Reproductive/Obstetrics                            Anesthesia Physical Anesthesia Plan  ASA: III  Anesthesia Plan: General   Post-op Pain Management:    Induction: Intravenous  PONV Risk Score and Plan: 1 and Ondansetron and Dexamethasone  Airway Management Planned: Oral ETT  Additional Equipment:   Intra-op Plan:   Post-operative Plan:  Extubation in OR  Informed Consent: I have reviewed the patients History and Physical, chart, labs and discussed the procedure including the risks, benefits and alternatives for the proposed anesthesia with the patient or authorized representative who has indicated his/her understanding and acceptance.     Dental advisory given  Plan Discussed with: CRNA and Surgeon  Anesthesia Plan Comments:         Anesthesia Quick Evaluation

## 2019-10-23 NOTE — Interval H&P Note (Signed)
History and Physical Interval Note:  10/23/2019 12:42 PM  Cameron Schultz  has presented today for surgery, with the diagnosis of atrial fibrillation.  The various methods of treatment have been discussed with the patient and family. After consideration of risks, benefits and other options for treatment, the patient has consented to  Procedure(s): ATRIAL FIBRILLATION ABLATION (N/A) as a surgical intervention.  The patient's history has been reviewed, patient examined, no change in status, stable for surgery.  I have reviewed the patient's chart and labs.  Questions were answered to the patient's satisfaction.    He reports compliance with xarelto without interruption. Cardiac CT reviewed with patient.  Repeat noncontrast CT in 12 months to follow-up on his lung nodule is advised.  He says he will arrange to have this done through the [place].  Hillis Range

## 2019-10-23 NOTE — Transfer of Care (Signed)
Immediate Anesthesia Transfer of Care Note  Patient: Cameron Schultz  Procedure(s) Performed: ATRIAL FIBRILLATION ABLATION (N/A )  Patient Location: Cath Lab  Anesthesia Type:General  Level of Consciousness: awake, alert , oriented and patient cooperative  Airway & Oxygen Therapy: Patient Spontanous Breathing and Patient connected to nasal cannula oxygen  Post-op Assessment: Report given to RN and Post -op Vital signs reviewed and stable  Post vital signs: Reviewed and stable  Last Vitals:  Vitals Value Taken Time  BP 129/59 10/23/19 1556  Temp    Pulse 60 10/23/19 1557  Resp 19 10/23/19 1557  SpO2 100 % 10/23/19 1557  Vitals shown include unvalidated device data.  Last Pain:  Vitals:   10/23/19 1033  TempSrc: Oral         Complications: No apparent anesthesia complications

## 2019-10-23 NOTE — Progress Notes (Signed)
Patient and wife was given discharge instructions. Both verbalized understanding. 

## 2019-10-23 NOTE — Anesthesia Postprocedure Evaluation (Signed)
Anesthesia Post Note  Patient: Dione Plover  Procedure(s) Performed: ATRIAL FIBRILLATION ABLATION (N/A )     Anesthesia Type: General Level of consciousness: awake and alert Pain management: pain level controlled Vital Signs Assessment: post-procedure vital signs reviewed and stable Respiratory status: spontaneous breathing, nonlabored ventilation and respiratory function stable Cardiovascular status: blood pressure returned to baseline and stable Postop Assessment: no apparent nausea or vomiting Anesthetic complications: no    Last Vitals:  Vitals:   10/23/19 1033 10/23/19 1556  BP: 113/71 (!) 129/59  Pulse: 67 61  Resp: 16 18  Temp: 36.9 C (!) 35.8 C  SpO2: 98%     Last Pain:  Vitals:   10/23/19 1556  TempSrc: Temporal  PainSc: 0-No pain                 Lucretia Kern

## 2019-10-23 NOTE — Discharge Instructions (Signed)

## 2019-10-24 ENCOUNTER — Telehealth: Payer: Self-pay | Admitting: Internal Medicine

## 2019-10-24 NOTE — Telephone Encounter (Signed)
    Pt would like to know when he take his first shower after his procedure if he needs to keep his bandages on or off, and if the wound can get wet  Please advise

## 2019-10-24 NOTE — Telephone Encounter (Signed)
Advised Pt to remove bandage after 24 hours and then he can shower.  Advised to not let water hit area directly and to be careful of area for a few days to not disrupt the clot.  Pt indicates understanding and thanked nurse for call back.

## 2019-11-05 ENCOUNTER — Ambulatory Visit: Payer: No Typology Code available for payment source | Admitting: Cardiology

## 2019-11-20 ENCOUNTER — Other Ambulatory Visit: Payer: Self-pay

## 2019-11-20 ENCOUNTER — Encounter (HOSPITAL_COMMUNITY): Payer: Self-pay | Admitting: Physician Assistant

## 2019-11-20 ENCOUNTER — Ambulatory Visit (HOSPITAL_COMMUNITY)
Admission: RE | Admit: 2019-11-20 | Discharge: 2019-11-20 | Disposition: A | Discharge status: Home / Self Care | Payer: No Typology Code available for payment source | Source: Ambulatory Visit | Attending: Physician Assistant | Admitting: Physician Assistant

## 2019-11-20 VITALS — BP 128/88 | HR 59 | Ht 73.5 in | Wt 282.8 lb

## 2019-11-20 DIAGNOSIS — I4892 Unspecified atrial flutter: Secondary | ICD-10-CM | POA: Diagnosis not present

## 2019-11-20 DIAGNOSIS — Z951 Presence of aortocoronary bypass graft: Secondary | ICD-10-CM | POA: Insufficient documentation

## 2019-11-20 DIAGNOSIS — Z8249 Family history of ischemic heart disease and other diseases of the circulatory system: Secondary | ICD-10-CM | POA: Insufficient documentation

## 2019-11-20 DIAGNOSIS — Z7901 Long term (current) use of anticoagulants: Secondary | ICD-10-CM | POA: Diagnosis not present

## 2019-11-20 DIAGNOSIS — Z981 Arthrodesis status: Secondary | ICD-10-CM | POA: Diagnosis not present

## 2019-11-20 DIAGNOSIS — I483 Typical atrial flutter: Secondary | ICD-10-CM | POA: Diagnosis not present

## 2019-11-20 DIAGNOSIS — F17210 Nicotine dependence, cigarettes, uncomplicated: Secondary | ICD-10-CM | POA: Insufficient documentation

## 2019-11-20 DIAGNOSIS — Z6836 Body mass index (BMI) 36.0-36.9, adult: Secondary | ICD-10-CM | POA: Insufficient documentation

## 2019-11-20 DIAGNOSIS — Z79899 Other long term (current) drug therapy: Secondary | ICD-10-CM | POA: Insufficient documentation

## 2019-11-20 DIAGNOSIS — E669 Obesity, unspecified: Secondary | ICD-10-CM | POA: Diagnosis not present

## 2019-11-20 DIAGNOSIS — I10 Essential (primary) hypertension: Secondary | ICD-10-CM | POA: Diagnosis not present

## 2019-11-20 DIAGNOSIS — Z96642 Presence of left artificial hip joint: Secondary | ICD-10-CM | POA: Insufficient documentation

## 2019-11-20 DIAGNOSIS — E785 Hyperlipidemia, unspecified: Secondary | ICD-10-CM | POA: Insufficient documentation

## 2019-11-20 DIAGNOSIS — G4733 Obstructive sleep apnea (adult) (pediatric): Secondary | ICD-10-CM | POA: Diagnosis not present

## 2019-11-20 DIAGNOSIS — I252 Old myocardial infarction: Secondary | ICD-10-CM | POA: Diagnosis not present

## 2019-11-20 DIAGNOSIS — I4819 Other persistent atrial fibrillation: Secondary | ICD-10-CM | POA: Insufficient documentation

## 2019-11-20 DIAGNOSIS — D6869 Other thrombophilia: Secondary | ICD-10-CM | POA: Diagnosis not present

## 2019-11-20 DIAGNOSIS — I2510 Atherosclerotic heart disease of native coronary artery without angina pectoris: Secondary | ICD-10-CM | POA: Diagnosis not present

## 2019-11-20 NOTE — Progress Notes (Signed)
Primary Care Physician: Windy Fast, MD Primary Cardiologist: [facility] Primary Electrophysiologist: Dr Rayann Heman Referring Physician: Dr Rosilyn Mings Nibert is a 64 y.o. male with a history of persistent atrial fibrillation, atrial flutter, OSA, CAD, systolic dysfunction, HTN, HLD who presents for follow up in the [facility]. The patient was initially diagnosed with atrial fibrillation remotely and has been maintained on amiodarone. Patient is on Xarelto for a CHADS2VASC score of 3. Patient evaluated by Dr Rayann Heman on 09/28/19 who recommended ablation. Patient is s/p afib and aflutter ablation 10/23/19 with Dr Rayann Heman. He has done very well since the procedure with no heart racing or palpitations. He denies CP, swallowing, or groin issues.   Today, he denies symptoms of palpitations, chest pain, shortness of breath, orthopnea, PND, lower extremity edema, dizziness, presyncope, syncope, snoring, daytime somnolence, bleeding, or neurologic sequela. The patient is tolerating medications without difficulties and is otherwise without complaint today.    Atrial Fibrillation Risk Factors:  he does have symptoms or diagnosis of sleep apnea. he is compliant with CPAP therapy. he does not have a history of rheumatic fever.   he has a BMI of Body mass index is 36.81 kg/m.Marland Kitchen Filed Weights   11/20/19 1145  Weight: 128.3 kg    Family History  Problem Relation Age of Onset  . Heart attack Father        in his 53s     Atrial Fibrillation Management history:  Previous antiarrhythmic drugs: amiodarone Previous cardioversions: 4 times Previous ablations: 10/23/19 CHADS2VASC score: 3 Anticoagulation history: Xarelto    Past Medical History:  Diagnosis Date  . Acute upper GI bleed   . Atrial fibrillation ([place])   . Atrial flutter ([place])   . Chronic anticoagulation   . Coronary artery disease   . Essential hypertension 08/18/2019  . Hyperlipidemia 08/18/2019    . Myocardial infarction ([place])    unknown  . Obesity (BMI 30-39.9) 08/18/2019  . OSA on CPAP 08/18/2019  . Sleep apnea   . Tobacco abuse 08/18/2019   Past Surgical History:  Procedure Laterality Date  . ATRIAL FIBRILLATION ABLATION N/A 10/23/2019   Procedure: ATRIAL FIBRILLATION ABLATION;  Surgeon: Thompson Grayer, MD;  Location: [facility];  Service: Cardiovascular;  Laterality: N/A;  . CERVICAL FUSION    . CORONARY ARTERY BYPASS GRAFT  2013   1  . CORONARY ARTERY BYPASS GRAFT     DONE IN [place]  . ESOPHAGOGASTRODUODENOSCOPY (EGD) WITH PROPOFOL N/A 12/21/2016   Procedure: ESOPHAGOGASTRODUODENOSCOPY (EGD) WITH PROPOFOL;  Surgeon: Otis Brace, MD;  Location: [facility];  Service: Gastroenterology;  Laterality: N/A;  . TONSILLECTOMY    . TOTAL HIP ARTHROPLASTY Left 02/22/2019   Procedure: TOTAL HIP ARTHROPLASTY ANTERIOR APPROACH;  Surgeon: Rod Can, MD;  Location: [facility];  Service: Orthopedics;  Laterality: Left;    Current Outpatient Medications  Medication Sig Dispense Refill  . acetaminophen (TYLENOL) 325 MG tablet Take by mouth.    . betamethasone dipropionate 0.05 % cream Apply topically.    . carvedilol (COREG) 12.5 MG tablet Take 1 tablet (12.5 mg total) by mouth 2 (two) times daily with a meal. 60 tablet 1  . diclofenac Sodium (VOLTAREN) 1 % GEL Apply topically.    . docusate sodium (COLACE) 100 MG capsule Take 1 capsule (100 mg total) by mouth 2 (two) times daily. (Patient taking differently: Take 100 mg by mouth daily. ) 60 capsule 1  . lisinopril (ZESTRIL) 10 MG  tablet Take 1 tablet (10 mg total) by mouth daily. 30 tablet 1  . Melatonin 3 MG CAPS Take 6 mg by mouth at bedtime.    . nicotine (NICODERM CQ - DOSED IN MG/24 HR) 7 mg/24hr patch Place 1 patch (7 mg total) onto the skin daily. 28 patch 0  . pantoprazole (PROTONIX) 40 MG tablet Take 1 tablet (40 mg total) by mouth daily. 30 tablet 0  . rivaroxaban (XARELTO) 20 MG TABS tablet Take  20 mg by mouth daily with breakfast.     . rosuvastatin (CRESTOR) 40 MG tablet Take 40 mg by mouth at bedtime.    . torsemide (DEMADEX) 20 MG tablet Take 10 mg by mouth daily.     No current facility-administered medications for this encounter.    No Known Allergies  Social History   Socioeconomic History  . Marital status: Married    Spouse name: Not on file  . Number of children: Not on file  . Years of education: Not on file  . Highest education level: Not on file  Occupational History  . Not on file  Tobacco Use  . Smoking status: Current Every Day Smoker    Packs/day: 0.25    Years: 15.00    Pack years: 3.75  . Smokeless tobacco: Never Used  . Tobacco comment: 3 cigarettes daily  Vaping Use  . Vaping Use: Never used  Substance and Sexual Activity  . Alcohol use: No  . Drug use: No  . Sexual activity: Not on file  Other Topics Concern  . Not on file  Social History Narrative   Lives in [place] with wife.   Disabled vet   Social Determinants of Health   Financial Resource Strain:   . Difficulty of Paying Living Expenses:   Food Insecurity:   . Worried About Programme researcher, broadcasting/film/video in the Last Year:   . Barista in the Last Year:   Transportation Needs:   . Freight forwarder (Medical):   Marland Kitchen Lack of Transportation (Non-Medical):   Physical Activity:   . Days of Exercise per Week:   . Minutes of Exercise per Session:   Stress:   . Feeling of Stress :   Social Connections:   . Frequency of Communication with Friends and Family:   . Frequency of Social Gatherings with Friends and Family:   . Attends Religious Services:   . Active Member of Clubs or Organizations:   . Attends Banker Meetings:   Marland Kitchen Marital Status:   Intimate Partner Violence:   . Fear of Current or Ex-Partner:   . Emotionally Abused:   Marland Kitchen Physically Abused:   . Sexually Abused:      ROS- All systems are reviewed and negative except as per the HPI above.  Physical  Exam: Vitals:   11/20/19 1145  BP: 128/88  Pulse: (!) 59  Weight: 128.3 kg  Height: 6' 1.5" (1.867 m)    GEN- The patient is well appearing obese male, alert and oriented x 3 today.   Head- normocephalic, atraumatic Eyes-  Sclera clear, conjunctiva pink Ears- hearing intact Oropharynx- clear Neck- supple  Lungs- Clear to ausculation bilaterally, normal work of breathing Heart- Regular rate and rhythm, no murmurs, rubs or gallops  GI- soft, NT, ND, + BS Extremities- no clubbing, cyanosis, or edema MS- no significant deformity or atrophy Skin- no rash or lesion Psych- euthymic mood, full affect Neuro- strength and sensation are intact  Wt Readings from  Last 3 Encounters:  11/20/19 128.3 kg  10/23/19 127 kg  10/09/19 132.5 kg    EKG today demonstrates SB HR 59, PR 164, QRS 102, QTc 435  Echo 08/20/19 demonstrated  1. Left ventricular ejection fraction, by estimation, is 45 to 50%. The  left ventricle has mildly decreased function. The left ventricle  demonstrates regional wall motion abnormalities (see scoring  diagram/findings for description). There is moderate  left ventricular hypertrophy. Left ventricular diastolic parameters are  consistent with Grade I diastolic dysfunction (impaired relaxation). There  is moderate hypokinesis of the left ventricular, basal-mid inferior wall.  2. Right ventricular systolic function is mildly reduced. The right  ventricular size is normal.  3. Left atrial size was severely dilated.  4. The mitral valve is abnormal. Mild mitral valve regurgitation.  5. The aortic valve is tricuspid. Aortic valve regurgitation is not  visualized.  6. The inferior vena cava is dilated in size with >50% respiratory  variability, suggesting right atrial pressure of 8 mmHg.   Comparison(s): Changes from prior study are noted. 05/31/19: LVEF 50-55%.   Epic records are reviewed at length today  CHA2DS2-VASc Score = 3  The patient's score is based  upon: CHF History: 1 HTN History: 1 Age : 0 Diabetes History: 0 Stroke History: 0 Vascular Disease History: 1 Gender: 0      ASSESSMENT AND PLAN: 1. Persistent Atrial Fibrillation/atrial flutter The patient's CHA2DS2-VASc score is 3, indicating a 3.2% annual risk of stroke.   S/p afib and aflutter ablation with Dr Johney Frame on 10/23/19. Patient appears to be maintaining SR off AAD Continue Xarelto 20 mg daily with no missed doses for at least 3 months post ablation. Continue Coreg 12.5 mg BID  2. Secondary Hypercoagulable State (ICD10:  D68.69) The patient is at significant risk for stroke/thromboembolism based upon his CHA2DS2-VASc Score of 3.  Continue Rivaroxaban (Xarelto).   3. Obesity Body mass index is 36.81 kg/m. Lifestyle modification was discussed at length including regular exercise and weight reduction.  4. Obstructive sleep apnea The importance of adequate treatment of sleep apnea was discussed today in order to improve our ability to maintain sinus rhythm long term. Patient reports compliance with CPAP therapy.  5. CAD No anginal symptoms. No ischemia on recent stress test.  6. HTN Stable, no changes today.   Follow up with Dr Johney Frame as scheduled.    Jorja Loa PA-C [facility] [address] [place], [place] [address] [phone] 11/20/2019 12:11 PM

## 2020-01-21 ENCOUNTER — Other Ambulatory Visit: Payer: Self-pay

## 2020-01-21 ENCOUNTER — Telehealth (INDEPENDENT_AMBULATORY_CARE_PROVIDER_SITE_OTHER): Payer: No Typology Code available for payment source | Admitting: Internal Medicine

## 2020-01-21 DIAGNOSIS — I483 Typical atrial flutter: Secondary | ICD-10-CM

## 2020-01-21 DIAGNOSIS — I4819 Other persistent atrial fibrillation: Secondary | ICD-10-CM | POA: Diagnosis not present

## 2020-01-21 DIAGNOSIS — I2510 Atherosclerotic heart disease of native coronary artery without angina pectoris: Secondary | ICD-10-CM | POA: Diagnosis not present

## 2020-01-21 DIAGNOSIS — D6869 Other thrombophilia: Secondary | ICD-10-CM

## 2020-01-21 NOTE — Progress Notes (Signed)
Electrophysiology TeleHealth Note  Due to national recommendations of social distancing due to COVID 19, an audio telehealth visit is felt to be most appropriate for this patient at this time.  Verbal consent was obtained by me for the telehealth visit today.  The patient does not have capability for a virtual visit.  A phone visit is therefore required today.   Date:  01/21/2020   ID:  Cameron Schultz, DOB 08-Mar-1956, MRN [id number]  Location: patient's home  Provider location:  [place]  Evaluation Performed: Follow-up visit  PCP:  [name], MD   Electrophysiologist:  Dr Johney Frame  Chief Complaint:  palpitations  History of Present Illness:    Cameron Schultz is a 64 y.o. male who presents via telehealth conferencing today.  Since his ablation, the patient reports doing very well. He is pleased with results.  Denies procedure related complications.  SOB is much improved. Today, he denies symptoms of palpitations, chest pain, shortness of breath,  lower extremity edema, dizziness, presyncope, or syncope.  The patient is otherwise without complaint today.     Past Medical History:  Diagnosis Date  . Acute upper GI bleed   . Atrial fibrillation (HCC)   . Atrial flutter (HCC)   . Chronic anticoagulation   . Coronary artery disease   . Essential hypertension 08/18/2019  . Hyperlipidemia 08/18/2019  . Myocardial infarction (HCC)    unknown  . Obesity (BMI 30-39.9) 08/18/2019  . OSA on CPAP 08/18/2019  . Sleep apnea   . Tobacco abuse 08/18/2019    Past Surgical History:  Procedure Laterality Date  . ATRIAL FIBRILLATION ABLATION N/A 10/23/2019   Procedure: ATRIAL FIBRILLATION ABLATION;  Surgeon: Hillis Range, MD;  [facility];  Service: Cardiovascular;  Laterality: N/A;  . CERVICAL FUSION    . CORONARY ARTERY BYPASS GRAFT  2013   1  . CORONARY ARTERY BYPASS GRAFT     [facility]  . ESOPHAGOGASTRODUODENOSCOPY (EGD) WITH PROPOFOL  N/A 12/21/2016   Procedure: ESOPHAGOGASTRODUODENOSCOPY (EGD) WITH PROPOFOL;  Surgeon: Kathi Der, MD;  [facility];  Service: Gastroenterology;  Laterality: N/A;  . TONSILLECTOMY    . TOTAL HIP ARTHROPLASTY Left 02/22/2019   Procedure: TOTAL HIP ARTHROPLASTY ANTERIOR APPROACH;  Surgeon: Samson Frederic, MD;  Location: [facility];  Service: Orthopedics;  Laterality: Left;    Current Outpatient Medications  Medication Sig Dispense Refill  . acetaminophen (TYLENOL) 325 MG tablet Take by mouth.    . betamethasone dipropionate 0.05 % cream Apply topically.    . carvedilol (COREG) 12.5 MG tablet Take 1 tablet (12.5 mg total) by mouth 2 (two) times daily with a meal. 60 tablet 1  . diclofenac Sodium (VOLTAREN) 1 % GEL Apply topically.    . docusate sodium (COLACE) 100 MG capsule Take 1 capsule (100 mg total) by mouth 2 (two) times daily. (Patient taking differently: Take 100 mg by mouth daily. ) 60 capsule 1  . lisinopril (ZESTRIL) 10 MG tablet Take 1 tablet (10 mg total) by mouth daily. 30 tablet 1  . Melatonin 3 MG CAPS Take 6 mg by mouth at bedtime.    . nicotine (NICODERM CQ - DOSED IN MG/24 HR) 7 mg/24hr patch Place 1 patch (7 mg total) onto the skin daily. 28 patch 0  . pantoprazole (PROTONIX) 40 MG tablet Take 1 tablet (40 mg total) by mouth daily. 30 tablet 0  . rivaroxaban (XARELTO) 20 MG TABS tablet Take 20 mg by  mouth daily with breakfast.     . rosuvastatin (CRESTOR) 40 MG tablet Take 40 mg by mouth at bedtime.    . torsemide (DEMADEX) 20 MG tablet Take 10 mg by mouth daily.     No current facility-administered medications for this visit.    Allergies:   Patient has no known allergies.   Social History:  The patient  reports that he has been smoking. He has a 3.75 pack-year smoking history. He has never used smokeless tobacco. He reports that he does not drink alcohol and does not use drugs.   ROS:  Please see the history of present illness.   All other systems are  personally reviewed and negative.    Exam:    Vital Signs:  There were no vitals taken for this visit.  Well sounding, alert and conversant   Labs/Other Tests and Data Reviewed:    Recent Labs: 08/18/2019: TSH 0.874 08/20/2019: ALT 30; Magnesium 1.8 10/03/2019: BUN 16; Creatinine, Ser 1.07; Hemoglobin 13.7; Platelets 142; Potassium 3.9; Sodium 139   Wt Readings from Last 3 Encounters:  11/20/19 282 lb 12.8 oz (128.3 kg)  10/23/19 280 lb (127 kg)  10/09/19 292 lb (132.5 kg)     ASSESSMENT & PLAN:    1.  Persistent atrial fibrillation/ atrial flutter Doing great post ablation off AAD therapy Pleased with results of the procedure and denies procedure related complications chads2vasc score is 3.  He should be on long term anticoagulation We discussed lifestyle modification at length today.  The importance of regular weight loss and exercise were discussed  2. OSA Compliance with CPAP is advised  3. Obesity We discussed importance of weight loss today  4. HTN Stable No change required today  5. CAD No ischemic symptoms   Risks, benefits and potential toxicities for medications prescribed and/or refilled reviewed with patient today.   Follow-up:  He wishes to follow-up at the [place].  He says he has cardiology arranged there.  He will contact my office if I can assist further in the future.   Patient Risk:  after full review of this patients clinical status, I feel that they are at moderate risk at this time.  Today, I have spent 15 minutes with the patient with telehealth technology discussing arrhythmia management .    Randolm Idol, MD  01/21/2020 11:40 AM     [facility] [address] [place] [address] [phone] (office) [phone] (fax)

## 2020-03-14 ENCOUNTER — Telehealth: Payer: Self-pay | Admitting: *Deleted

## 2020-03-14 NOTE — Telephone Encounter (Signed)
   [facility] Pre-operative Risk Assessment    HEARTCARE STAFF: - Please ensure there is not already an duplicate clearance open for this procedure. - Under Visit Info/Reason for Call, type in Other and utilize the format Clearance MM/DD/YY or Clearance TBD. Do not use dashes or single digits. - If request is for dental extraction, please clarify the # of teeth to be extracted.  Request for surgical clearance:  1. What type of surgery is being performed? LEFT L5, S1 TFESI   2. When is this surgery scheduled? TBD   3. What type of clearance is required (medical clearance vs. Pharmacy clearance to hold med vs. Both)? BOTH  4. Are there any medications that need to be held prior to surgery and how long? XARELTO x 3 DAYS  5. Practice name and name of physician performing surgery? [facility]; DR. Julieta Gutting [facility]   6. What is the office phone number? [phone]   7.   What is the office fax number? [phone]  8.   Anesthesia type (None, local, MAC, general) ? NOT LISTED   Julaine Hua 03/14/2020, 11:46 AM  _________________________________________________________________   (provider comments below)

## 2020-03-14 NOTE — Telephone Encounter (Signed)
Clinical pharmacist to review Xarelto 

## 2020-03-14 NOTE — Telephone Encounter (Signed)
Patient with diagnosis of afib on Xarelto for anticoagulation.    Procedure: left L5, S1 ESI Date of procedure: TBD [id number]} CHA2DS2-VASc Score = 3  This indicates a 3.2% annual risk of stroke. The patient's score is based upon: CHF History: 1 HTN History: 1 Diabetes History: 0 Stroke History: 0 Vascular Disease History: 1 Age Score: 0 Gender Score: 0  CrCl >168mL/min Platelet count 142K  Pt underwent ablation 10/23/19, now outside 3 month required post-op anticoag period. Per office protocol, patient can hold Xarelto for 3 days prior to procedure.

## 2020-03-14 NOTE — Telephone Encounter (Signed)
   Primary Cardiologist: Kristeen Miss, MD  Chart reviewed as part of pre-operative protocol coverage. Patient was contacted 03/14/2020 in reference to pre-operative risk assessment for pending surgery as outlined below.  Cameron Schultz was last seen on 01/21/2020 via virtual visit by Dr. Johney Frame.  Since that day, Cameron Schultz has done well without chest pain or significant shortness of breath.  Therefore, based on ACC/AHA guidelines, the patient would be at acceptable risk for the planned procedure without further cardiovascular testing.   Per our clinical pharmacist, he will need to hold Xarelto for 3 days prior to the procedure and restart as soon as possible after the procedure at the surgeon's discretion.   The patient was advised that if he develops new symptoms prior to surgery to contact our office to arrange for a follow-up visit, and he verbalized understanding.  I will route this recommendation to the requesting party via Epic fax function and remove from pre-op pool. Please call with questions.  [place], [place] 03/14/2020, 5:02 PM

## 2020-08-18 DIAGNOSIS — F1920 Other psychoactive substance dependence, uncomplicated: Secondary | ICD-10-CM | POA: Diagnosis present

## 2020-09-19 IMAGING — DX DG PORTABLE PELVIS
1 series · 1 of 1 positions shown · non-contrast
Comparison: Intraoperative C-arm image of 02/22/2019

CLINICAL DATA: Avascular necrosis of the left femoral head. Status
post left total hip replacement.

EXAM:
PORTABLE PELVIS 1-2 VIEWS

[pelvis ap]
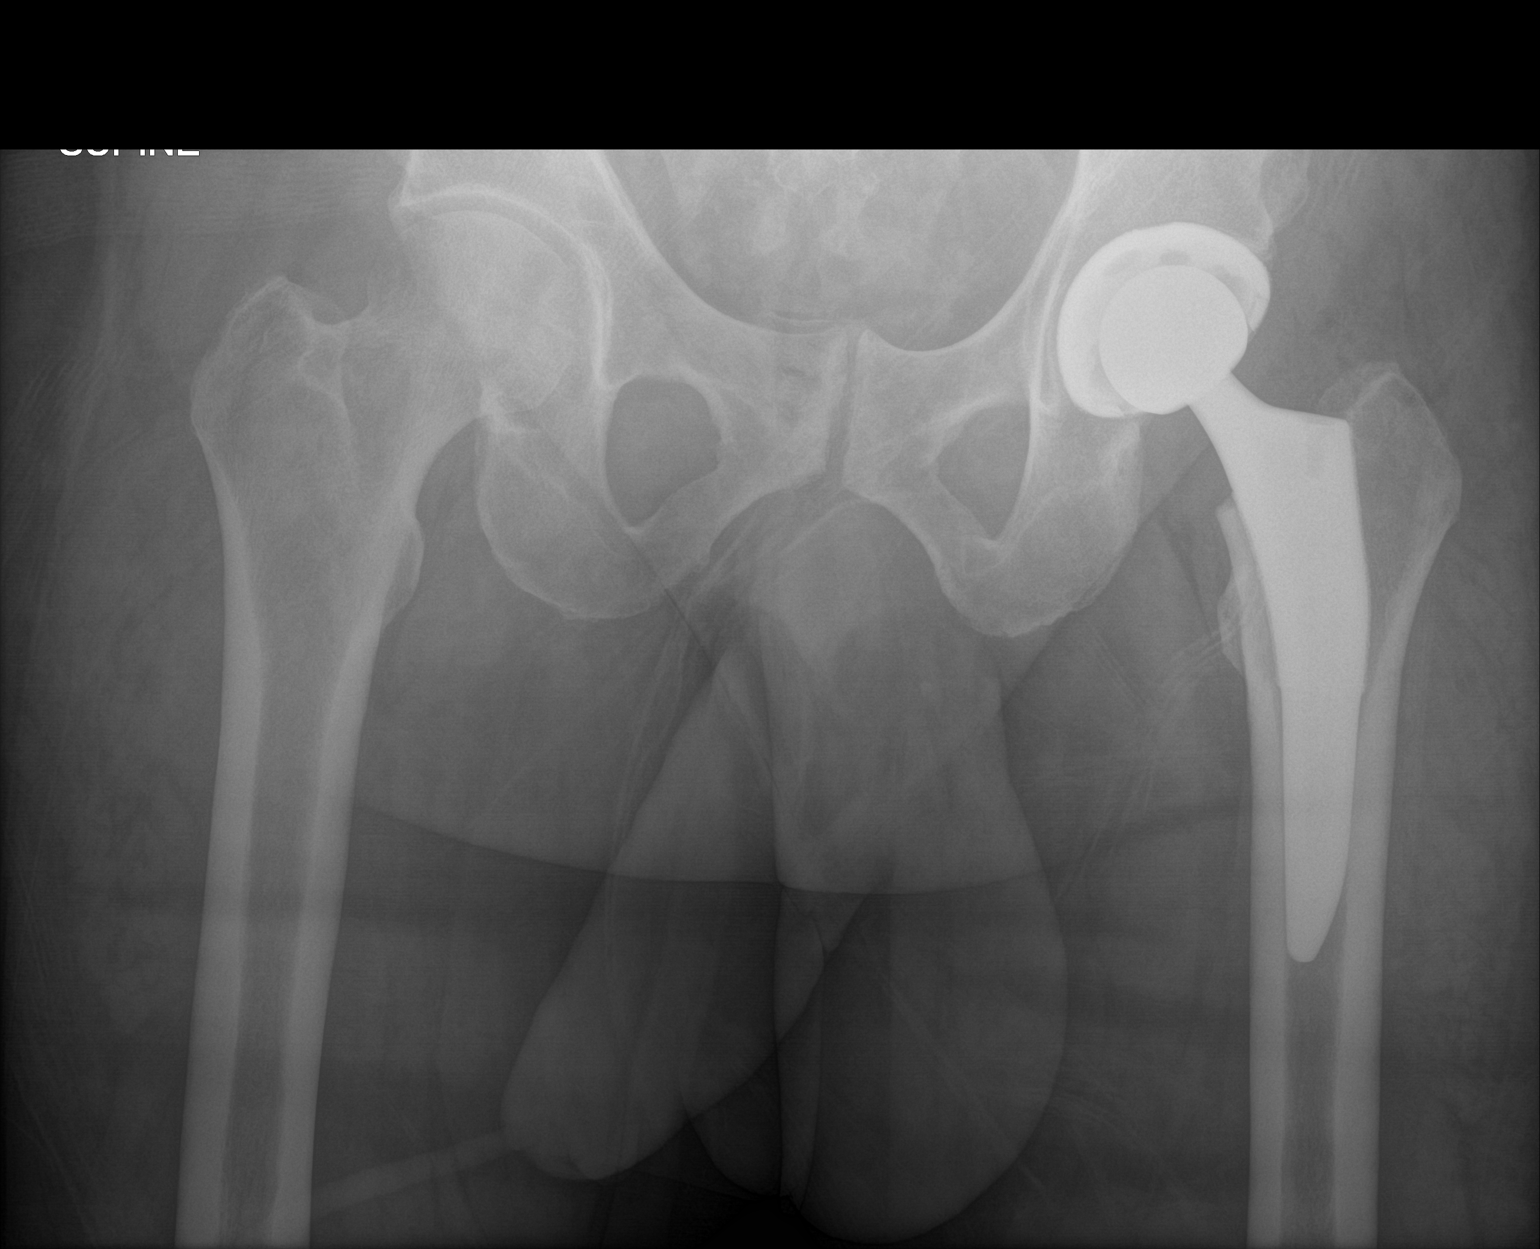

[1 of 1 positions shown; findings below may reference images not displayed]

FINDINGS: The components of the right total hip prosthesis appear in excellent
position in the AP projection. No fractures. The visualized pelvic
bones and right hip appear normal.
IMPRESSION: Satisfactory appearance of the right total hip prosthesis in the AP
projection.

## 2020-09-19 IMAGING — RF DG C-ARM 1-60 MIN-NO REPORT
1 series · 2 of 2 positions shown · non-contrast
Comparison: None.

CLINICAL DATA: Hip arthroplasty

EXAM:
OPERATIVE LEFT HIP (WITH PELVIS IF PERFORMED) AP VIEWS
TECHNIQUE: Fluoroscopic spot image(s) were submitted for interpretation
post-operatively.

[Series 1: unknown protocol · 0.20mm/px · 2 of 2 slices shown]
[im 1/2]
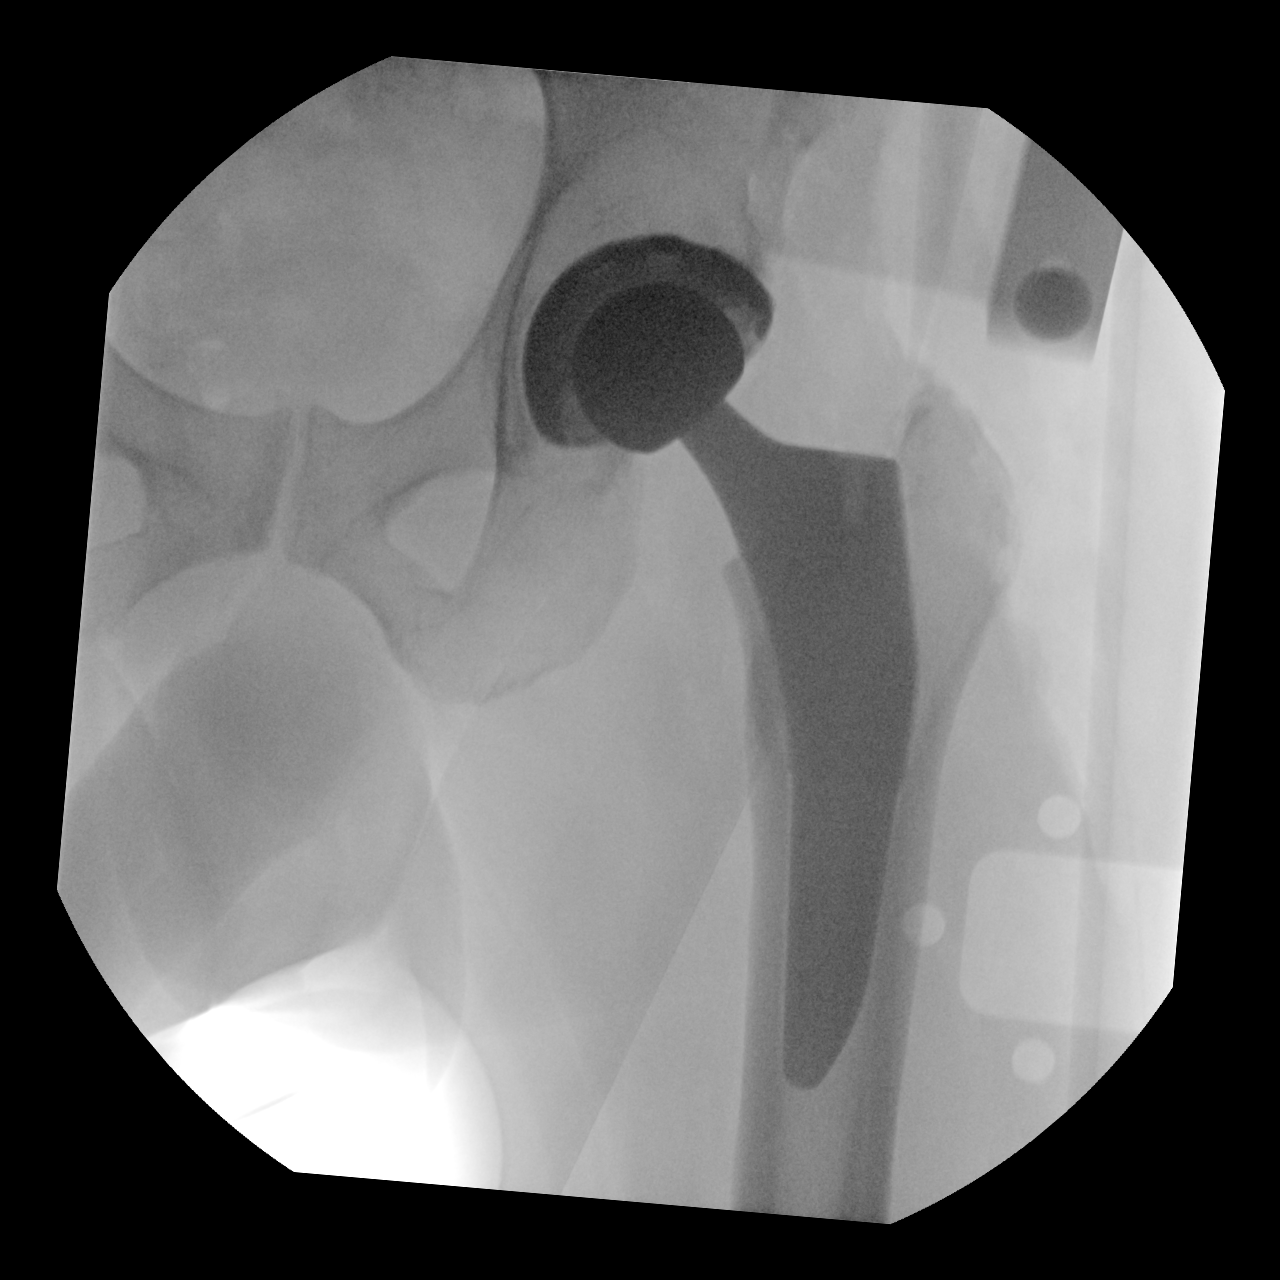
[im 2/2]
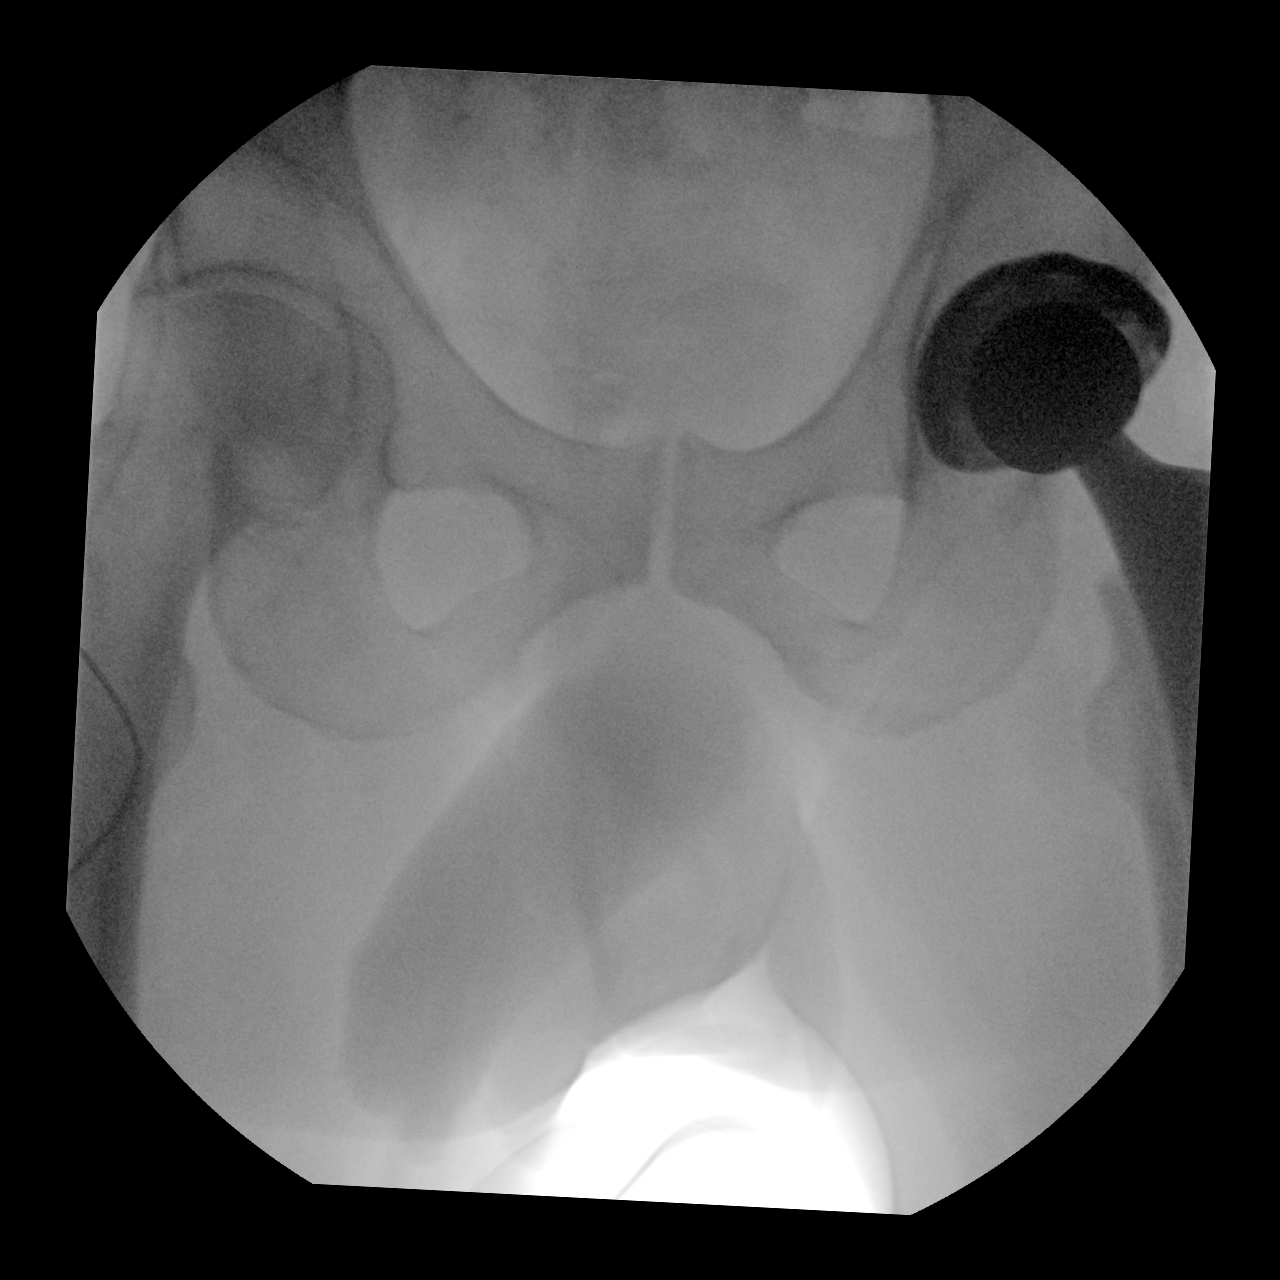

[2 of 2 positions shown; findings below may reference images not displayed]

FINDINGS: Two C-arm fluoroscopic images were obtained intraoperatively and
submitted for post operative interpretation. Left total hip
arthroplasty hardware appears in its expected alignment. No obvious
complication. Please see the performing provider's procedural report
for further detail.
IMPRESSION: As above.

## 2020-12-03 DIAGNOSIS — M5416 Radiculopathy, lumbar region: Secondary | ICD-10-CM | POA: Insufficient documentation

## 2021-03-05 ENCOUNTER — Encounter (HOSPITAL_BASED_OUTPATIENT_CLINIC_OR_DEPARTMENT_OTHER): Payer: Self-pay

## 2021-03-05 ENCOUNTER — Emergency Department (HOSPITAL_BASED_OUTPATIENT_CLINIC_OR_DEPARTMENT_OTHER)
Admission: EM | Admit: 2021-03-05 | Discharge: 2021-03-05 | Disposition: A | Discharge status: Home / Self Care | Payer: No Typology Code available for payment source | Attending: Emergency Medicine | Admitting: Emergency Medicine

## 2021-03-05 ENCOUNTER — Other Ambulatory Visit: Payer: Self-pay

## 2021-03-05 DIAGNOSIS — Z79899 Other long term (current) drug therapy: Secondary | ICD-10-CM | POA: Insufficient documentation

## 2021-03-05 DIAGNOSIS — I2510 Atherosclerotic heart disease of native coronary artery without angina pectoris: Secondary | ICD-10-CM | POA: Diagnosis not present

## 2021-03-05 DIAGNOSIS — L989 Disorder of the skin and subcutaneous tissue, unspecified: Secondary | ICD-10-CM

## 2021-03-05 DIAGNOSIS — Z951 Presence of aortocoronary bypass graft: Secondary | ICD-10-CM | POA: Diagnosis not present

## 2021-03-05 DIAGNOSIS — K629 Disease of anus and rectum, unspecified: Secondary | ICD-10-CM | POA: Insufficient documentation

## 2021-03-05 DIAGNOSIS — I10 Essential (primary) hypertension: Secondary | ICD-10-CM | POA: Diagnosis not present

## 2021-03-05 DIAGNOSIS — F17210 Nicotine dependence, cigarettes, uncomplicated: Secondary | ICD-10-CM | POA: Diagnosis not present

## 2021-03-05 MED ORDER — SULFAMETHOXAZOLE-TRIMETHOPRIM 800-160 MG PO TABS: 1.0000 | ORAL_TABLET | Freq: Two times a day (BID) | ORAL | 0 refills | Status: AC

## 2021-03-05 NOTE — ED Provider Notes (Signed)
[facility] EMERGENCY DEPARTMENT Provider Note   CSN: [id number] Arrival date & time: 03/05/21  1539     History Chief Complaint  Patient presents with   Groin Swelling    Cameron Schultz is a 65 y.o. male presenting for evaluation of a groin lesion. Patient states today he noticed an area of swelling at the base of his left testicle.  It is not very tender.  He was lifting something heavy today, and had vigorous sexual intercourse yesterday on thought he may have a hernia or have pulled something.  He denies any discomfort with multiple bone, Donnell pain.  No urinary symptoms or abnormal bowel movements.  No penile discharge.  No pain in the testicle or scrotum.  He has not taken anything for this.  HPI     Past Medical History:  Diagnosis Date   Acute upper GI bleed    Atrial fibrillation (HCC)    Atrial flutter (HCC)    Chronic anticoagulation    Coronary artery disease    Essential hypertension 08/18/2019   Hyperlipidemia 08/18/2019   Myocardial infarction (HCC)    unknown   Obesity (BMI 30-39.9) 08/18/2019   OSA on CPAP 08/18/2019   Sleep apnea    Tobacco abuse 08/18/2019    Patient Active Problem List   Diagnosis Date Noted   Persistent atrial fibrillation (HCC) 11/20/2019   Secondary hypercoagulable state (HCC) 11/20/2019   Atrial flutter (HCC)    Atrial fibrillation with RVR (HCC) 08/18/2019   Essential hypertension 08/18/2019   Hyperlipidemia 08/18/2019   OSA on CPAP 08/18/2019   Obesity (BMI 30-39.9) 08/18/2019   Tobacco abuse 08/18/2019   CAD (coronary artery disease) 08/18/2019   Avascular necrosis of hip, left (HCC) 02/22/2019   Chronic anticoagulation     Past Surgical History:  Procedure Laterality Date   ATRIAL FIBRILLATION ABLATION N/A 10/23/2019   Procedure: ATRIAL FIBRILLATION ABLATION;  Surgeon: Hillis Range, MD;  [facility];  Service: Cardiovascular;  Laterality: N/A;   BACK SURGERY     CERVICAL FUSION      CORONARY ARTERY BYPASS GRAFT  2013   1   CORONARY ARTERY BYPASS GRAFT     [facility]   ESOPHAGOGASTRODUODENOSCOPY (EGD) WITH PROPOFOL N/A 12/21/2016   Procedure: ESOPHAGOGASTRODUODENOSCOPY (EGD) WITH PROPOFOL;  Surgeon: Kathi Der, MD;  [facility];  Service: Gastroenterology;  Laterality: N/A;   TONSILLECTOMY     TOTAL HIP ARTHROPLASTY Left 02/22/2019   Procedure: TOTAL HIP ARTHROPLASTY ANTERIOR APPROACH;  Surgeon: Samson Frederic, MD;  Location: [facility];  Service: Orthopedics;  Laterality: Left;       Family History  Problem Relation Age of Onset   Heart attack Father        in his 57s    Social History   Tobacco Use   Smoking status: Every Day    Packs/day: 0.25    Years: 15.00    Pack years: 3.75    Types: Cigarettes   Smokeless tobacco: Never   Tobacco comments:    3 cigarettes daily  Vaping Use   Vaping Use: Never used  Substance Use Topics   Alcohol use: No   Drug use: No    Home Medications Prior to Admission medications   Medication Sig Start Date End Date Taking? Authorizing Provider  sulfamethoxazole-trimethoprim (BACTRIM DS) 800-160 MG tablet Take 1 tablet by mouth 2 (two) times daily for 7 days. 03/05/21 03/12/21 Yes Sohana Austell, PA-C  acetaminophen (TYLENOL)  325 MG tablet Take by mouth. 12/01/18   [provider]  betamethasone dipropionate 0.05 % cream Apply topically.    [provider]  carvedilol (COREG) 12.5 MG tablet Take 1 tablet (12.5 mg total) by mouth 2 (two) times daily with a meal. 08/20/19   Rodolph Bong, MD  diclofenac Sodium (VOLTAREN) 1 % GEL Apply topically.    [provider]  docusate sodium (COLACE) 100 MG capsule Take 1 capsule (100 mg total) by mouth 2 (two) times daily. Patient taking differently: Take 100 mg by mouth daily.  02/23/19   Swinteck, Arlys John, MD  lisinopril (ZESTRIL) 10 MG tablet Take 1 tablet (10 mg total) by mouth daily. 08/20/19   Rodolph Bong, MD  Melatonin 3 MG CAPS Take 6 mg by mouth at bedtime.    [provider]  nicotine (NICODERM CQ - DOSED IN MG/24 HR) 7 mg/24hr patch Place 1 patch (7 mg total) onto the skin daily. 08/21/19   Rodolph Bong, MD  pantoprazole (PROTONIX) 40 MG tablet Take 1 tablet (40 mg total) by mouth daily. 12/23/16   Marquette Saa, MD  rivaroxaban (XARELTO) 20 MG TABS tablet Take 20 mg by mouth daily with breakfast.     [provider]  rosuvastatin (CRESTOR) 40 MG tablet Take 40 mg by mouth at bedtime.    [provider]  torsemide (DEMADEX) 20 MG tablet Take 10 mg by mouth daily.    [provider]    Allergies    Patient has no known allergies.  Review of Systems   Review of Systems  Constitutional:  Negative for fever.  Genitourinary:        Perineum lesion   Physical Exam Updated Vital Signs BP 133/85 (BP Location: Left Arm)   Pulse 79   Temp 98.7 F (37.1 C) (Oral)   Resp 20   Ht 6\' 1"  (1.854 m)   Wt 129.7 kg   SpO2 99%   BMI 37.73 kg/m   Physical Exam Vitals and nursing note reviewed. Exam conducted with a chaperone present.  Constitutional:      General: He is not in acute distress.    Appearance: Normal appearance.  HENT:     Head: Normocephalic and atraumatic.  Eyes:     Conjunctiva/sclera: Conjunctivae normal.     Pupils: Pupils are equal, round, and reactive to light.  Cardiovascular:     Rate and Rhythm: Normal rate and regular rhythm.     Pulses: Normal pulses.  Pulmonary:     Effort: Pulmonary effort is normal. No respiratory distress.     Breath sounds: Normal breath sounds. No wheezing.     Comments: Speaking in full sentences.  Clear lung sounds in all fields. Abdominal:     General: There is no distension.     Palpations: Abdomen is soft. There is no mass.     Tenderness: There is no abdominal tenderness. There is no guarding or rebound.     Hernia: No hernia is present.  Genitourinary:      Comments:  Fluid-filled area at the base of the left testicle.  No skin induration or erythema.  No significant tenderness.  No lesions, pain, or swelling noted in the testicle or scrotum.  No lesions noted on the penis.  No inguinal lymphadenopathy or tenderness. Musculoskeletal:        General: Normal range of motion.     Cervical back: Normal range of motion and neck supple.  Skin:  General: Skin is warm and dry.     Capillary Refill: Capillary refill takes less than 2 seconds.  Neurological:     Mental Status: He is alert and oriented to person, place, and time.  Psychiatric:        Mood and Affect: Mood and affect normal.        Speech: Speech normal.        Behavior: Behavior normal.    ED Results / Procedures / Treatments   Labs (all labs ordered are listed, but only abnormal results are displayed) Labs Reviewed - No data to display  EKG None  Radiology No results found.  Procedures Procedures   Medications Ordered in ED Medications - No data to display  ED Course  I have reviewed the triage vital signs and the nursing notes.  Pertinent labs & imaging results that were available during my care of the patient were reviewed by me and considered in my medical decision making (see chart for details).    MDM Rules/Calculators/A&P                           Patient presenting for evaluation of swelling in the perineum.  On exam, patient has what appears to be a fluid-filled area.  It is not erythematous, warm, or indurated.  It is not particularly tender.  Discussed with patient that this appears to be fluid-filled, offered I&D for more definitive management, patient declined.  Discussed as he is not having fevers, and is mildly tender, favor cyst over infection, however in the setting of acute symptoms will cover with antibiotics and have patient use warm compresses.  Follow-up with urology and/or PCP as needed.  At this time, patient appears safe for discharge.  Return precautions  given.  Patient states he understands and agrees to plan.   Final Clinical Impression(s) / ED Diagnoses Final diagnoses:  Lesion of male perineum    Rx / DC Orders ED Discharge Orders          Ordered    sulfamethoxazole-trimethoprim (BACTRIM DS) 800-160 MG tablet  2 times daily        03/05/21 1616             Najmo Pardue, PA-C 03/05/21 1621    Gloris Manchester, MD 03/06/21 1541

## 2021-03-05 NOTE — Discharge Instructions (Signed)
Your exam today showed a fluid-filled area in the perineum. We will treat this with antibiotics and warm compresses initially.  Use warm compresses 3 times a day to help with inflammation and pain.  However if it worsens, it may need to be drained. Follow-up with urologist or your primary care doctor as needed for recheck. Use Tylenol or ibuprofen as needed for pain. Return to the emergency room if develop fevers, severe worsening pain, or with any new, worsening, concerning symptoms.

## 2021-03-05 NOTE — ED Triage Notes (Signed)
Pt c/o swelling and a hard area to groin-noticed during shower today-denies injury-NAD-steady gait

## 2021-04-15 ENCOUNTER — Other Ambulatory Visit (HOSPITAL_COMMUNITY): Payer: Self-pay | Admitting: Internal Medicine

## 2021-04-15 ENCOUNTER — Other Ambulatory Visit: Payer: Self-pay | Admitting: Internal Medicine

## 2021-04-15 DIAGNOSIS — R3121 Asymptomatic microscopic hematuria: Secondary | ICD-10-CM

## 2021-05-04 ENCOUNTER — Other Ambulatory Visit: Payer: Self-pay | Admitting: Radiology

## 2021-05-04 ENCOUNTER — Other Ambulatory Visit: Payer: Self-pay | Admitting: Student

## 2021-05-05 ENCOUNTER — Encounter (HOSPITAL_COMMUNITY): Payer: Self-pay

## 2021-05-05 ENCOUNTER — Other Ambulatory Visit: Payer: Self-pay

## 2021-05-05 ENCOUNTER — Ambulatory Visit (HOSPITAL_COMMUNITY)
Admission: RE | Admit: 2021-05-05 | Discharge: 2021-05-05 | Disposition: A | Discharge status: Home / Self Care | Payer: No Typology Code available for payment source | Source: Ambulatory Visit | Attending: Internal Medicine | Admitting: Internal Medicine

## 2021-05-05 DIAGNOSIS — I4891 Unspecified atrial fibrillation: Secondary | ICD-10-CM | POA: Insufficient documentation

## 2021-05-05 DIAGNOSIS — G4733 Obstructive sleep apnea (adult) (pediatric): Secondary | ICD-10-CM | POA: Diagnosis not present

## 2021-05-05 DIAGNOSIS — I129 Hypertensive chronic kidney disease with stage 1 through stage 4 chronic kidney disease, or unspecified chronic kidney disease: Secondary | ICD-10-CM | POA: Diagnosis not present

## 2021-05-05 DIAGNOSIS — R809 Proteinuria, unspecified: Secondary | ICD-10-CM | POA: Insufficient documentation

## 2021-05-05 DIAGNOSIS — E785 Hyperlipidemia, unspecified: Secondary | ICD-10-CM | POA: Diagnosis not present

## 2021-05-05 DIAGNOSIS — I2510 Atherosclerotic heart disease of native coronary artery without angina pectoris: Secondary | ICD-10-CM | POA: Diagnosis not present

## 2021-05-05 DIAGNOSIS — F17210 Nicotine dependence, cigarettes, uncomplicated: Secondary | ICD-10-CM | POA: Insufficient documentation

## 2021-05-05 DIAGNOSIS — R3121 Asymptomatic microscopic hematuria: Secondary | ICD-10-CM | POA: Insufficient documentation

## 2021-05-05 DIAGNOSIS — I4892 Unspecified atrial flutter: Secondary | ICD-10-CM | POA: Diagnosis not present

## 2021-05-05 DIAGNOSIS — R768 Other specified abnormal immunological findings in serum: Secondary | ICD-10-CM | POA: Diagnosis not present

## 2021-05-05 DIAGNOSIS — Z7901 Long term (current) use of anticoagulants: Secondary | ICD-10-CM | POA: Insufficient documentation

## 2021-05-05 DIAGNOSIS — I252 Old myocardial infarction: Secondary | ICD-10-CM | POA: Insufficient documentation

## 2021-05-05 DIAGNOSIS — N189 Chronic kidney disease, unspecified: Secondary | ICD-10-CM | POA: Diagnosis not present

## 2021-05-05 LAB — PROTIME-INR
INR: 1.1 (ref 0.8–1.2)
Prothrombin Time: 14.4 seconds (ref 11.4–15.2)

## 2021-05-05 LAB — CBC
HCT: 40.4 % (ref 39.0–52.0)
Hemoglobin: 13.7 g/dL (ref 13.0–17.0)
MCH: 29.9 pg (ref 26.0–34.0)
MCHC: 33.9 g/dL (ref 30.0–36.0)
MCV: 88.2 fL (ref 80.0–100.0)
Platelets: 162 10*3/uL (ref 150–400)
RBC: 4.58 MIL/uL (ref 4.22–5.81)
RDW: 14.8 % (ref 11.5–15.5)
WBC: 7.3 10*3/uL (ref 4.0–10.5)
nRBC: 0 % (ref 0.0–0.2)

## 2021-05-05 MED ORDER — LIDOCAINE HCL (PF) 1 % IJ SOLN
INTRAMUSCULAR | Status: AC
  Filled 2021-05-05: qty 30

## 2021-05-05 MED ORDER — GELATIN ABSORBABLE 12-7 MM EX MISC
CUTANEOUS | Status: AC
  Filled 2021-05-05: qty 1

## 2021-05-05 MED ORDER — FENTANYL CITRATE (PF) 100 MCG/2ML IJ SOLN
INTRAMUSCULAR | Status: AC | PRN
  Administered 2021-05-05: 50 ug via INTRAVENOUS

## 2021-05-05 MED ORDER — SODIUM CHLORIDE 0.9 % IV SOLN: INTRAVENOUS | Status: DC

## 2021-05-05 MED ORDER — FENTANYL CITRATE (PF) 100 MCG/2ML IJ SOLN
INTRAMUSCULAR | Status: AC
  Filled 2021-05-05: qty 2

## 2021-05-05 MED ORDER — MIDAZOLAM HCL 2 MG/2ML IJ SOLN
INTRAMUSCULAR | Status: AC
  Filled 2021-05-05: qty 2

## 2021-05-05 MED ORDER — MIDAZOLAM HCL 2 MG/2ML IJ SOLN
INTRAMUSCULAR | Status: AC | PRN
  Administered 2021-05-05: 1 mg via INTRAVENOUS

## 2021-05-05 NOTE — H&P (Signed)
Chief Complaint: Patient was seen in consultation today for image guided random renal biopsy at the request of [place]  Referring Physician(s): [place]  Supervising Physician: Mir, Biochemist, clinical  Patient Status: [place] - Out-pt  History of Present Illness: Cameron Schultz is a 65 y.o. male with PMHs of HTN, HLD, CAD, MI, A. fib/a flutter on Xarelto, OSA on CPAP who was referred to [facility] IR for random kidney biopsy from his provider from [place].  Per radiology order from [place], patient has history of microscopic hematuria, mild proteinuria, positive ANA, anti-SM, anti-SM RNP who is in need for random renal biopsy for further evaluation.  Most recent lab at [place] on 11/19/2020 showed normal kidney function and mild leukocytopenia and thrombocytopenia.   IR was requested for image guided random renal biopsy. Patient presents to [facility] IR today for the procedure  Patient laying in bed, not in acute distress.  Denise headache, fever, chills, shortness of breath, cough, chest pain, abdominal pain, nausea ,vomiting, and bleeding.   Past Medical History:  Diagnosis Date   Acute upper GI bleed    Atrial fibrillation (HCC)    Atrial flutter (HCC)    Chronic anticoagulation    Coronary artery disease    Essential hypertension 08/18/2019   Hyperlipidemia 08/18/2019   Myocardial infarction [facility])    unknown   Obesity (BMI 30-39.9) 08/18/2019   OSA on CPAP 08/18/2019   Sleep apnea    Tobacco abuse 08/18/2019    Past Surgical History:  Procedure Laterality Date   ATRIAL FIBRILLATION ABLATION N/A 10/23/2019   Procedure: ATRIAL FIBRILLATION ABLATION;  Surgeon: Thompson Grayer, MD;  Location: [place] CV LAB;  Service: Cardiovascular;  Laterality: N/A;   BACK SURGERY     CERVICAL FUSION     CORONARY ARTERY BYPASS GRAFT  2013   1   CORONARY ARTERY BYPASS GRAFT     DONE IN [place]   ESOPHAGOGASTRODUODENOSCOPY (EGD) WITH PROPOFOL N/A 12/21/2016   Procedure:  ESOPHAGOGASTRODUODENOSCOPY (EGD) WITH PROPOFOL;  Surgeon: Otis Brace, MD;  Location: [place];  Service: Gastroenterology;  Laterality: N/A;   TONSILLECTOMY     TOTAL HIP ARTHROPLASTY Left 02/22/2019   Procedure: TOTAL HIP ARTHROPLASTY ANTERIOR APPROACH;  Surgeon: Rod Can, MD;  Location: [facility] ORS;  Service: Orthopedics;  Laterality: Left;    Allergies: Patient has no known allergies.  Medications: Prior to Admission medications   Medication Sig Start Date End Date Taking? Authorizing Provider  Ascorbic Acid (VITAMIN C) 1000 MG tablet Take 1,000 mg by mouth daily.   Yes [provider]  betamethasone dipropionate 0.05 % cream Apply 1 application topically daily as needed (irritation).   Yes [provider]  carvedilol (COREG) 25 MG tablet Take 12.5 mg by mouth 2 (two) times daily with a meal.   Yes [provider]  diclofenac Sodium (VOLTAREN) 1 % GEL Apply 1 application topically daily as needed (pain).   Yes [provider]  lisinopril (ZESTRIL) 40 MG tablet Take 40 mg by mouth daily.   Yes [provider]  Melatonin 3 MG CAPS Take 3 mg by mouth at bedtime as needed (sleep).   Yes [provider]  pantoprazole (PROTONIX) 40 MG tablet Take 1 tablet (40 mg total) by mouth daily. 12/23/16  Yes Verner Mould, MD  rivaroxaban (XARELTO) 20 MG TABS tablet Take 20 mg by mouth daily with breakfast.    Yes [provider]  rosuvastatin (CRESTOR) 40 MG tablet Take 40 mg by mouth  at bedtime.   Yes [provider]  torsemide (DEMADEX) 20 MG tablet Take 10 mg by mouth daily.   Yes [provider]  traZODone (DESYREL) 50 MG tablet Take 25 mg by mouth at bedtime.   Yes [provider]  acetaminophen (TYLENOL) 325 MG tablet Take 650 mg by mouth every 6 (six) hours as needed for moderate pain. 12/01/18   [provider]     Family History  Problem Relation Age of Onset   Heart  attack Father        in his 91s    Social History   Socioeconomic History   Marital status: Married    Spouse name: Not on file   Number of children: Not on file   Years of education: Not on file   Highest education level: Not on file  Occupational History   Not on file  Tobacco Use   Smoking status: Every Day    Packs/day: 0.25    Years: 15.00    Pack years: 3.75    Types: Cigarettes   Smokeless tobacco: Never   Tobacco comments:    3 cigarettes daily  Vaping Use   Vaping Use: Never used  Substance and Sexual Activity   Alcohol use: No   Drug use: No   Sexual activity: Not on file  Other Topics Concern   Not on file  Social History Narrative   Lives in [place] with wife.   Disabled vet   Social Determinants of Health   Financial Resource Strain: Not on file  Food Insecurity: Not on file  Transportation Needs: Not on file  Physical Activity: Not on file  Stress: Not on file  Social Connections: Not on file     Review of Systems: A 12 point ROS discussed and pertinent positives are indicated in the HPI above.  All other systems are negative.  Vital Signs: BP 117/66   Pulse 62   Temp 97.7 F (36.5 C) (Oral)   Resp 16   Ht 6' (1.829 m)   Wt 280 lb (127 kg)   SpO2 95%   BMI 37.97 kg/m   Physical Exam Vitals and nursing note reviewed.  Constitutional:      General: Patient is not in acute distress.    Appearance: Normal appearance. Patient is not ill-appearing.  HENT:     Head: Normocephalic and atraumatic.     Mouth/Throat:     Mouth: Mucous membranes are moist.     Pharynx: Oropharynx is clear.  Cardiovascular:     Rate and Rhythm: Normal rate and regular rhythm.     Pulses: Normal pulses.     Heart sounds: Normal heart sounds.  Pulmonary:     Effort: Pulmonary effort is normal.     Breath sounds: Normal breath sounds.  Abdominal:     General: Abdomen is flat. Bowel sounds are normal.     Palpations: Abdomen is soft.  Musculoskeletal:      Cervical back: Neck supple.  Skin:    General: Skin is warm and dry.     Coloration: Skin is not jaundiced or pale.  Neurological:     Mental Status: Patient is alert and oriented to person, place, and time.  Psychiatric:        Mood and Affect: Mood normal.        Behavior: Behavior normal.        Judgment: Judgment normal.    MD Evaluation Airway: WNL Heart: WNL Abdomen: WNL Chest/  Lungs: WNL ASA  Classification: 2 Mallampati/Airway Score: Two  Imaging: No results found.  Labs:  CBC: Recent Labs    05/05/21 0630  WBC 7.3  HGB 13.7  HCT 40.4  PLT 162    COAGS: Recent Labs    05/05/21 0630  INR 1.1    BMP: No results for input(s): NA, K, CL, CO2, GLUCOSE, BUN, CALCIUM, CREATININE, GFRNONAA, GFRAA in the last 8760 hours.  Invalid input(s): CMP  LIVER FUNCTION TESTS: No results for input(s): BILITOT, AST, ALT, ALKPHOS, PROT, ALBUMIN in the last 8760 hours.  TUMOR MARKERS: No results for input(s): AFPTM, CEA, CA199, CHROMGRNA in the last 8760 hours.  Assessment and Plan: 65 y.o. male with microscopic hematuria, mild proteinuria, positive ANA, anti-SM, anti-SM RNP who is in need for random renal biopsy for further evaluation.  IR was requested for image guided random renal biopsy. Patient presents to [facility] IR today for the procedure N.p.o. since midnight VSS CBC all within normal limit INR 1.1 Patient on Xarelto for A. fib-last taken on  Risks and benefits of random renal biopsy was discussed with the patient and/or patient's family including, but not limited to bleeding, infection, damage to adjacent structures or low yield requiring additional tests.  All of the questions were answered and there is agreement to proceed.  Consent signed and in chart.     Thank you for this interesting consult.  I greatly enjoyed meeting Kristion Deforge and look forward to participating in their care.  A copy of this report was sent to the requesting provider on this  date.  Electronically Signed: Tera Mater, PA-C 05/05/2021, 7:42 AM   I spent a total of  30 Minutes   in face to face in clinical consultation, greater than 50% of which was counseling/coordinating care for random renal biopsy  This chart was dictated using voice recognition software.  Despite best efforts to proofread,  errors can occur which can change the documentation meaning.

## 2021-05-05 NOTE — Procedures (Signed)
Interventional Radiology Procedure Note  Procedure: US guided random renal biopsy  Indication: Microscopic hematuria, mild proteinuria, positive ANA, anti-SM, anti-SM RNP   Findings: Please refer to procedural dictation for full description.  Complications: None  EBL: < 10 mL  Acquanetta Belling, MD [phone]

## 2021-06-02 ENCOUNTER — Encounter (HOSPITAL_COMMUNITY): Payer: Self-pay

## 2021-06-11 LAB — SURGICAL PATHOLOGY

## 2022-02-04 ENCOUNTER — Emergency Department (HOSPITAL_BASED_OUTPATIENT_CLINIC_OR_DEPARTMENT_OTHER)
Admission: EM | Admit: 2022-02-04 | Discharge: 2022-02-04 | Disposition: A | Discharge status: Home / Self Care | Payer: No Typology Code available for payment source | Attending: Emergency Medicine | Admitting: Emergency Medicine

## 2022-02-04 ENCOUNTER — Encounter (HOSPITAL_BASED_OUTPATIENT_CLINIC_OR_DEPARTMENT_OTHER): Payer: Self-pay | Admitting: Emergency Medicine

## 2022-02-04 ENCOUNTER — Other Ambulatory Visit: Payer: Self-pay

## 2022-02-04 DIAGNOSIS — I2510 Atherosclerotic heart disease of native coronary artery without angina pectoris: Secondary | ICD-10-CM | POA: Insufficient documentation

## 2022-02-04 DIAGNOSIS — Z951 Presence of aortocoronary bypass graft: Secondary | ICD-10-CM | POA: Insufficient documentation

## 2022-02-04 DIAGNOSIS — I10 Essential (primary) hypertension: Secondary | ICD-10-CM | POA: Diagnosis not present

## 2022-02-04 DIAGNOSIS — K5900 Constipation, unspecified: Secondary | ICD-10-CM | POA: Insufficient documentation

## 2022-02-04 DIAGNOSIS — Z79899 Other long term (current) drug therapy: Secondary | ICD-10-CM | POA: Insufficient documentation

## 2022-02-04 DIAGNOSIS — Z7901 Long term (current) use of anticoagulants: Secondary | ICD-10-CM | POA: Diagnosis not present

## 2022-02-04 DIAGNOSIS — I4891 Unspecified atrial fibrillation: Secondary | ICD-10-CM | POA: Insufficient documentation

## 2022-02-04 DIAGNOSIS — K625 Hemorrhage of anus and rectum: Secondary | ICD-10-CM | POA: Diagnosis not present

## 2022-02-04 LAB — BASIC METABOLIC PANEL
Anion gap: 8 (ref 5–15)
BUN: 24 mg/dL — ABNORMAL HIGH (ref 8–23)
CO2: 23 mmol/L (ref 22–32)
Calcium: 8.9 mg/dL (ref 8.9–10.3)
Chloride: 109 mmol/L (ref 98–111)
Creatinine, Ser: 1.17 mg/dL (ref 0.61–1.24)
GFR, Estimated: >60 mL/min (ref >60)
Glucose, Bld: 78 mg/dL (ref 70–99)
Potassium: 5.1 mmol/L (ref 3.5–5.1)
Sodium: 140 mmol/L (ref 135–145)

## 2022-02-04 LAB — CBC
HCT: 42.5 % (ref 39.0–52.0)
Hemoglobin: 14.3 g/dL (ref 13.0–17.0)
MCH: 30.2 pg (ref 26.0–34.0)
MCHC: 33.6 g/dL (ref 30.0–36.0)
MCV: 89.9 fL (ref 80.0–100.0)
Platelets: 125 10*3/uL — ABNORMAL LOW (ref 150–400)
RBC: 4.73 MIL/uL (ref 4.22–5.81)
RDW: 14.6 % (ref 11.5–15.5)
WBC: 6.4 10*3/uL (ref 4.0–10.5)
nRBC: 0 % (ref 0.0–0.2)

## 2022-02-04 LAB — OCCULT BLOOD X 1 CARD TO LAB, STOOL: Fecal Occult Bld: NEGATIVE

## 2022-02-04 NOTE — ED Notes (Signed)
Reviewed AVS/discharge instruction with patient. Time allotted for and all questions answered. Patient is agreeable for d/c and escorted to ed exit by staff.  

## 2022-02-04 NOTE — ED Provider Notes (Signed)
[facility] EMERGENCY DEPT Provider Note   CSN: [id number] Arrival date & time: 02/04/22  1745     History  Chief Complaint  Patient presents with   Constipation   Rectal Bleeding    Cameron Schultz is a 66 y.o. male.  66 year old male with a history of peptic ulcer disease complicated by bleed, hypertension, hyperlipidemia, CAD status post bypass, atrial fibrillation on Xarelto who presents to the emergency department with constipation.  States he had 2 days of constipation.  No significant abdominal pain or nausea or vomiting.  Says that he was straining at home to have a bowel movement did have a small 1 and noticed small amount of blood on the toilet paper and pain after he defecated.  Says that just prior to evaluation he had a large bowel movement without significant bleeding.  Says that he did notice a small amount of blood on the toilet paper.  Denies any dizziness, shortness of breath, chest pain.  No melena recently.  Says that he has had a colonoscopy and has not had any lower GI bleeds but did have a peptic ulcer that required intervention in the past.   Constipation Associated symptoms: hematochezia   Rectal Bleeding      Home Medications Prior to Admission medications   Medication Sig Start Date End Date Taking? Authorizing Provider  acetaminophen (TYLENOL) 325 MG tablet Take 650 mg by mouth every 6 (six) hours as needed for moderate pain. 12/01/18   [provider]  Ascorbic Acid (VITAMIN C) 1000 MG tablet Take 1,000 mg by mouth daily.    [provider]  betamethasone dipropionate 0.05 % cream Apply 1 application topically daily as needed (irritation).    [provider]  carvedilol (COREG) 25 MG tablet Take 12.5 mg by mouth 2 (two) times daily with a meal.    [provider]  diclofenac Sodium (VOLTAREN) 1 % GEL Apply 1 application topically daily as needed (pain).    [provider]  lisinopril (ZESTRIL) 40  MG tablet Take 40 mg by mouth daily.    [provider]  Melatonin 3 MG CAPS Take 3 mg by mouth at bedtime as needed (sleep).    [provider]  pantoprazole (PROTONIX) 40 MG tablet Take 1 tablet (40 mg total) by mouth daily. 12/23/16   Marquette Saa, MD  rivaroxaban (XARELTO) 20 MG TABS tablet Take 20 mg by mouth daily with breakfast.     [provider]  rosuvastatin (CRESTOR) 40 MG tablet Take 40 mg by mouth at bedtime.    [provider]  torsemide (DEMADEX) 20 MG tablet Take 10 mg by mouth daily.    [provider]  traZODone (DESYREL) 50 MG tablet Take 25 mg by mouth at bedtime.    [provider]      Allergies    Patient has no known allergies.    Review of Systems   Review of Systems  Gastrointestinal:  Positive for constipation and hematochezia.    Physical Exam Updated Vital Signs BP 127/79   Pulse (!) 58   Temp 98.1 F (36.7 C) (Oral)   Resp 18   SpO2 99%  Physical Exam Vitals and nursing note reviewed.  Constitutional:      General: He is not in acute distress.    Appearance: He is well-developed.  HENT:     Head: Normocephalic and atraumatic.     Right Ear: External ear normal.     Left Ear:  External ear normal.     Nose: Nose normal.  Eyes:     Extraocular Movements: Extraocular movements intact.     Conjunctiva/sclera: Conjunctivae normal.     Pupils: Pupils are equal, round, and reactive to light.  Cardiovascular:     Rate and Rhythm: Normal rate. Rhythm irregular.     Heart sounds: Normal heart sounds.  Pulmonary:     Effort: Pulmonary effort is normal. No respiratory distress.     Breath sounds: Normal breath sounds.  Abdominal:     General: There is no distension.     Palpations: Abdomen is soft. There is no mass.     Tenderness: There is no abdominal tenderness. There is no guarding.  Genitourinary:    Comments: Chaperoned by patient's RN. No external hemorrhoids or fissures noted  on external inspection. No internal masses or hemorroids noted. Normal rectal tone. Brown stool in rectal vault. No melena or gross blood.   Musculoskeletal:        General: No swelling.     Cervical back: Normal range of motion and neck supple.     Right lower leg: No edema.     Left lower leg: No edema.  Skin:    General: Skin is warm and dry.     Capillary Refill: Capillary refill takes less than 2 seconds.  Neurological:     Mental Status: He is alert. Mental status is at baseline.  Psychiatric:        Mood and Affect: Mood normal.        Behavior: Behavior normal.     ED Results / Procedures / Treatments   Labs (all labs ordered are listed, but only abnormal results are displayed) Labs Reviewed  CBC - Abnormal; Notable for the following components:      Result Value   Platelets 125 (*)    All other components within normal limits  BASIC METABOLIC PANEL - Abnormal; Notable for the following components:   BUN 24 (*)    All other components within normal limits  OCCULT BLOOD X 1 CARD TO LAB, STOOL    EKG None  Radiology No results found.  Procedures Procedures   Medications Ordered in ED Medications - No data to display  ED Course/ Medical Decision Making/ A&P                           Medical Decision Making Amount and/or Complexity of Data Reviewed Labs: ordered.   Cameron Schultz is a 66 year old male with a history of peptic ulcer disease complicated by bleed, hypertension, hyperlipidemia, CAD status post bypass, atrial fibrillation on Xarelto who presents to the emergency department with constipation.   Initial Ddx:  Functional constipation, bowel obstruction, ileus, malignancy, GI bleed  MDM:  Feel the patient likely just has functional constipation at this time.  He is overall well-appearing and did not have significant abdominal pain, nausea or vomiting, or abdominal distention/tenderness that would indicate that he has a bowel obstruction or ileus.   Malignancy is possible so we will have him follow-up with his primary doctor regarding his symptoms.  GI bleed also on the differential but feel that it is less likely given the fact that he is straining to have a bowel movement neck pain with a bowel movement which likely suggestive fissure or hemorrhoid.  Plan:  Labs Rectal exam Hemoccult  ED Summary:  Rectal exam did not reveal gross blood.  Hemoccult was negative.  His labs showed baseline hemoglobin.  Do not feel that the patient is having a GI bleed at this time.  He did have a bowel movement in the emergency department and is feeling much better.  As result, do not feel that emergent imaging is required.  We will have the patient follow-up with his primary doctor in several days regarding his symptoms.  Dispo: DC Home. Return precautions discussed including, but not limited to, those listed in the AVS. Allowed pt time to ask questions which were answered fully prior to dc.   Records reviewed Care Everywhere The following labs were independently interpreted: Chemistry  Final Clinical Impression(s) / ED Diagnoses Final diagnoses:  Constipation, unspecified constipation type    Rx / DC Orders ED Discharge Orders     None         Rondel Baton, MD 02/05/22 2004

## 2022-02-04 NOTE — ED Notes (Signed)
Attempted IV in left hand - blown vein. Collected lab tubes; will try for IV if necessary after labs resulted.

## 2022-02-04 NOTE — ED Triage Notes (Signed)
C/o constipation. Last normal BM on Tuesday.  Notice blood on toilet paper today when wiping. On xarelto.

## 2022-02-04 NOTE — Discharge Instructions (Signed)
Today you were seen in the emergency department for your constipation and blood in your stool.    In the emergency department you had lab work that was reassuring and no blood on your rectal exam.    At home, please take MiraLAX to prevent constipation.    Check your MyChart online for the results of any tests that had not resulted by the time you left the emergency department.   Follow-up with your primary doctor in 2-3 days regarding your visit.    Return immediately to the emergency department if you experience any of the following: Recurrent bloody stools, vomiting, fevers, or any other concerning symptoms.    Thank you for visiting our Emergency Department. It was a pleasure taking care of you today.

## 2022-02-04 NOTE — ED Notes (Signed)
Pt reports that he has had relief of constipation while here at the hospital and notes a small amount of blood on toilet paper. He reports much more blood earlier at home. Pt alert & oriented, nad noted.

## 2023-02-07 ENCOUNTER — Other Ambulatory Visit: Payer: Self-pay

## 2023-02-07 ENCOUNTER — Emergency Department (HOSPITAL_COMMUNITY)
Admission: EM | Admit: 2023-02-07 | Discharge: 2023-02-08 | Disposition: A | Discharge status: Home / Self Care | Payer: No Typology Code available for payment source | Attending: Emergency Medicine | Admitting: Emergency Medicine

## 2023-02-07 ENCOUNTER — Encounter (HOSPITAL_COMMUNITY): Payer: Self-pay | Admitting: Emergency Medicine

## 2023-02-07 DIAGNOSIS — I10 Essential (primary) hypertension: Secondary | ICD-10-CM | POA: Diagnosis not present

## 2023-02-07 DIAGNOSIS — I2510 Atherosclerotic heart disease of native coronary artery without angina pectoris: Secondary | ICD-10-CM | POA: Diagnosis not present

## 2023-02-07 DIAGNOSIS — R310 Gross hematuria: Secondary | ICD-10-CM | POA: Diagnosis not present

## 2023-02-07 DIAGNOSIS — Z7901 Long term (current) use of anticoagulants: Secondary | ICD-10-CM | POA: Diagnosis not present

## 2023-02-07 DIAGNOSIS — Z79899 Other long term (current) drug therapy: Secondary | ICD-10-CM | POA: Insufficient documentation

## 2023-02-07 DIAGNOSIS — I4891 Unspecified atrial fibrillation: Secondary | ICD-10-CM | POA: Insufficient documentation

## 2023-02-07 DIAGNOSIS — R319 Hematuria, unspecified: Secondary | ICD-10-CM | POA: Diagnosis present

## 2023-02-07 LAB — CBC WITH DIFFERENTIAL/PLATELET
Abs Immature Granulocytes: 0.01 10*3/uL (ref 0.00–0.07)
Basophils Absolute: 0 10*3/uL (ref 0.0–0.1)
Basophils Relative: 0 %
Eosinophils Absolute: 0.1 10*3/uL (ref 0.0–0.5)
Eosinophils Relative: 2 %
HCT: 39.4 % (ref 39.0–52.0)
Hemoglobin: 13.3 g/dL (ref 13.0–17.0)
Immature Granulocytes: 0 %
Lymphocytes Relative: 43 %
Lymphs Abs: 2.3 10*3/uL (ref 0.7–4.0)
MCH: 30.3 pg (ref 26.0–34.0)
MCHC: 33.8 g/dL (ref 30.0–36.0)
MCV: 89.7 fL (ref 80.0–100.0)
Monocytes Absolute: 0.6 10*3/uL (ref 0.1–1.0)
Monocytes Relative: 12 %
Neutro Abs: 2.3 10*3/uL (ref 1.7–7.7)
Neutrophils Relative %: 43 %
Platelets: 130 10*3/uL — ABNORMAL LOW (ref 150–400)
RBC: 4.39 MIL/uL (ref 4.22–5.81)
RDW: 15.4 % (ref 11.5–15.5)
WBC: 5.3 10*3/uL (ref 4.0–10.5)
nRBC: 0 % (ref 0.0–0.2)

## 2023-02-07 LAB — URINALYSIS, W/ REFLEX TO CULTURE (INFECTION SUSPECTED)
Bilirubin Urine: NEGATIVE
Glucose, UA: NEGATIVE mg/dL
Ketones, ur: NEGATIVE mg/dL
Leukocytes,Ua: NEGATIVE
Nitrite: NEGATIVE
Protein, ur: NEGATIVE mg/dL
Specific Gravity, Urine: 1.001 — ABNORMAL LOW (ref 1.005–1.030)
pH: 5 (ref 5.0–8.0)

## 2023-02-07 LAB — COMPREHENSIVE METABOLIC PANEL
ALT: 26 U/L (ref 0–44)
AST: 27 U/L (ref 15–41)
Albumin: 3.3 g/dL — ABNORMAL LOW (ref 3.5–5.0)
Alkaline Phosphatase: 62 U/L (ref 38–126)
Anion gap: 5 (ref 5–15)
BUN: 10 mg/dL (ref 8–23)
CO2: 21 mmol/L — ABNORMAL LOW (ref 22–32)
Calcium: 8.3 mg/dL — ABNORMAL LOW (ref 8.9–10.3)
Chloride: 111 mmol/L (ref 98–111)
Creatinine, Ser: 0.99 mg/dL (ref 0.61–1.24)
GFR, Estimated: >60 mL/min (ref >60)
Glucose, Bld: 80 mg/dL (ref 70–99)
Potassium: 3.7 mmol/L (ref 3.5–5.1)
Sodium: 137 mmol/L (ref 135–145)
Total Bilirubin: 0.9 mg/dL (ref 0.3–1.2)
Total Protein: 6.8 g/dL (ref 6.5–8.1)

## 2023-02-07 LAB — CK: Total CK: 127 U/L (ref 49–397)

## 2023-02-07 NOTE — ED Provider Triage Note (Signed)
Emergency Medicine Provider Triage Evaluation Note  Cameron Schultz , a 67 y.o. male  was evaluated in triage.  Pt complains of painless hematuria.  He states about 1 hour ago he urinated and it was very dark and looked like there was blood in it.  He has urinated since then and his urine went from pink to clear.  He takes Xarelto.  Review of Systems  Positive: As above Negative: As above  Physical Exam  BP (!) 143/91 (BP Location: Right Arm)   Pulse 73   Temp 98 F (36.7 C)   Resp 16   SpO2 100%  Gen:   Awake, no distress   Resp:  Normal effort  MSK:   Moves extremities without difficulty  Other:    Medical Decision Making  Medically screening exam initiated at 7:23 PM.  Appropriate orders placed.  Dione Plover was informed that the remainder of the evaluation will be completed by another provider, this initial triage assessment does not replace that evaluation, and the importance of remaining in the ED until their evaluation is complete.     Lenard Simmer, [place] 02/07/23 1924

## 2023-02-07 NOTE — ED Triage Notes (Signed)
Patient coming to ED for evaluation of "possible blood in my urine."  Reports that today he noticed when he urinated that "my pee was really dark."  Drank water and noticed that urine became lighter.  Has no pain.  Concerned that he is urinating blood.  No reports of fevers.

## 2023-02-08 ENCOUNTER — Emergency Department (HOSPITAL_COMMUNITY): Payer: No Typology Code available for payment source

## 2023-02-08 NOTE — Discharge Instructions (Signed)
You were seen today for blood in your urine.  Your workup today is reassuring.  No infection.  No lesions on your CT scan.  Follow-up with urology if urine is persistently bloody.

## 2023-02-08 NOTE — ED Provider Notes (Signed)
[place] EMERGENCY DEPARTMENT AT [facility] Provider Note   CSN: [id number] Arrival date & time: 02/07/23  1912     History  Chief Complaint  Patient presents with   Hematuria    Cameron Schultz is a 67 y.o. male.  HPI     This is a 67 year old male who presents with hematuria.  Patient reports he had dark urine yesterday and noted some pink at the urethral meatus when he wipes.  Denies any flank pain or abdominal pain.  Denies dysuria.  Is on blood thinners.  Has a history of smoking.  Home Medications Prior to Admission medications   Medication Sig Start Date End Date Taking? Authorizing Provider  acetaminophen (TYLENOL) 325 MG tablet Take 650 mg by mouth every 6 (six) hours as needed for moderate pain. 12/01/18   [provider]  Ascorbic Acid (VITAMIN C) 1000 MG tablet Take 1,000 mg by mouth daily.    [provider]  betamethasone dipropionate 0.05 % cream Apply 1 application topically daily as needed (irritation).    [provider]  carvedilol (COREG) 25 MG tablet Take 12.5 mg by mouth 2 (two) times daily with a meal.    [provider]  diclofenac Sodium (VOLTAREN) 1 % GEL Apply 1 application topically daily as needed (pain).    [provider]  lisinopril (ZESTRIL) 40 MG tablet Take 40 mg by mouth daily.    [provider]  Melatonin 3 MG CAPS Take 3 mg by mouth at bedtime as needed (sleep).    [provider]  pantoprazole (PROTONIX) 40 MG tablet Take 1 tablet (40 mg total) by mouth daily. 12/23/16   Marquette Saa, MD  rivaroxaban (XARELTO) 20 MG TABS tablet Take 20 mg by mouth daily with breakfast.     [provider]  rosuvastatin (CRESTOR) 40 MG tablet Take 40 mg by mouth at bedtime.    [provider]  torsemide (DEMADEX) 20 MG tablet Take 10 mg by mouth daily.    [provider]  traZODone (DESYREL) 50 MG tablet Take 25 mg by mouth at bedtime.    [provider]      Allergies    Patient has no known allergies.    Review of Systems   Review of Systems  Constitutional:  Negative for fever.  Respiratory:  Negative for shortness of breath.   Cardiovascular:  Negative for chest pain.  Gastrointestinal:  Negative for abdominal pain.  Genitourinary:  Positive for hematuria. Negative for dysuria and flank pain.  All other systems reviewed and are negative.   Physical Exam Updated Vital Signs BP (!) 134/99   Pulse 71   Temp 98.2 F (36.8 C) (Oral)   Resp (!) 21   Ht 1.829 m (6')   Wt 127 kg   SpO2 99%   BMI 37.97 kg/m  Physical Exam Vitals and nursing note reviewed.  Constitutional:      Appearance: He is well-developed. He is not ill-appearing.  HENT:     Head: Normocephalic and atraumatic.  Cardiovascular:     Rate and Rhythm: Normal rate and regular rhythm.  Pulmonary:     Effort: Pulmonary effort is normal. No respiratory distress.  Abdominal:     General: Bowel sounds are normal.     Palpations: Abdomen is soft.     Tenderness: There is no abdominal tenderness. There is no right CVA tenderness, left CVA tenderness or rebound.  Musculoskeletal:     Cervical  back: Neck supple.  Lymphadenopathy:     Cervical: No cervical adenopathy.  Skin:    General: Skin is warm and dry.  Neurological:     Mental Status: He is alert and oriented to person, place, and time.  Psychiatric:        Mood and Affect: Mood normal.     ED Results / Procedures / Treatments   Labs (all labs ordered are listed, but only abnormal results are displayed) Labs Reviewed  URINALYSIS, W/ REFLEX TO CULTURE (INFECTION SUSPECTED) - Abnormal; Notable for the following components:      Result Value   Color, Urine STRAW (*)    Specific Gravity, Urine 1.001 (*)    Hgb urine dipstick LARGE (*)    Bacteria, UA RARE (*)    All other components within normal limits  CBC WITH DIFFERENTIAL/PLATELET - Abnormal; Notable for the following  components:   Platelets 130 (*)    All other components within normal limits  COMPREHENSIVE METABOLIC PANEL - Abnormal; Notable for the following components:   CO2 21 (*)    Calcium 8.3 (*)    Albumin 3.3 (*)    All other components within normal limits  CK    EKG None  Radiology CT Renal Stone Study  Result Date: 02/08/2023 CLINICAL DATA:  Hematuria. EXAM: CT ABDOMEN AND PELVIS WITHOUT CONTRAST TECHNIQUE: Multidetector CT imaging of the abdomen and pelvis was performed following the standard protocol without IV contrast. RADIATION DOSE REDUCTION: This exam was performed according to the departmental dose-optimization program which includes automated exposure control, adjustment of the mA and/or kV according to patient size and/or use of iterative reconstruction technique. COMPARISON:  None Available. FINDINGS: Lower chest: No acute abnormality. Hepatobiliary: No focal liver abnormality is seen. No gallstones, gallbladder wall thickening, or biliary dilatation. Pancreas: Unremarkable. No pancreatic ductal dilatation or surrounding inflammatory changes. Spleen: Normal in size without focal abnormality. Adrenals/Urinary Tract: The adrenal glands are within normal limits. No renal or ureteral calculus or obstructive uropathy bilaterally. The bladder is unremarkable. Stomach/Bowel: Stomach is within normal limits. Appendix appears normal. No evidence of bowel wall thickening, distention, or inflammatory changes. No free air or pneumatosis. A moderate amount of retained stool is present in the colon. Vascular/Lymphatic: Aortic atherosclerosis. No enlarged abdominal or pelvic lymph nodes. Reproductive: The prostate gland is mildly enlarged. Other: Abdominopelvic ascites. There is a fat containing umbilical hernia and right inguinal hernia. Musculoskeletal: Total hip arthroplasty changes are noted on the left. There are degenerative changes in the thoracolumbar spine. Mixed lytic and sclerotic lesions are  noted in the right femoral head, possible avascular necrosis. No acute osseous abnormality. IMPRESSION: 1. No renal calculus or obstructive uropathy. 2. Enlarged prostate gland. 3. Moderate amount of retained stool in the colon suggesting constipation. 4. Aortic atherosclerosis. Electronically Signed   By: Thornell Sartorius M.D.   On: 02/08/2023 03:32    Procedures Procedures    Medications Ordered in ED Medications - No data to display  ED Course/ Medical Decision Making/ A&P                                 Medical Decision Making Amount and/or Complexity of Data Reviewed Radiology: ordered.   This patient presents to the ED for concern of hematuria, this involves an extensive number of treatment options, and is a complaint that carries with it a high risk of complications and morbidity.  I considered the  following differential and admission for this acute, potentially life threatening condition.  The differential diagnosis includes infection, kidney stone, bladder mass, trauma  MDM:    This is a 67 year old male who presents with hematuria.  He is overall nontoxic and vital signs are reassuring.  He has no other associated symptoms including abdominal pain or flank pain.  He does have a smoking history which would increase his risk for bladder cancer.  Labs obtained and urinalysis is not consistent with UTI.  He does have gross hematuria.  CT obtained to rule out stone or renal mass.  CT is largely unremarkable with exception of constipation.  Patient referred to [facility].  (Labs, imaging, consults)  Labs: I Ordered, and personally interpreted labs.  The pertinent results include: CBC, BMP, urinalysis  Imaging Studies ordered: I ordered imaging studies including CT I independently visualized and interpreted imaging. I agree with the radiologist interpretation  Additional history obtained from chart review.  External records from outside source obtained and reviewed including  prior evaluations  Cardiac Monitoring: The patient was maintained on a cardiac monitor.  If on the cardiac monitor, I personally viewed and interpreted the cardiac monitored which showed an underlying rhythm of: Sinus rhythm  Reevaluation: After the interventions noted above, I reevaluated the patient and found that they have :improved  Social Determinants of Health:  lives independently  Disposition: Discharge  Co morbidities that complicate the patient evaluation  Past Medical History:  Diagnosis Date   Acute upper GI bleed    Atrial fibrillation (HCC)    Atrial flutter (HCC)    Chronic anticoagulation    Coronary artery disease    Essential hypertension 08/18/2019   Hyperlipidemia 08/18/2019   Myocardial infarction (HCC)    unknown   Obesity (BMI 30-39.9) 08/18/2019   OSA on CPAP 08/18/2019   Sleep apnea    Tobacco abuse 08/18/2019     Medicines No orders of the defined types were placed in this encounter.   I have reviewed the patients home medicines and have made adjustments as needed  Problem List / ED Course: Problem List Items Addressed This Visit   None Visit Diagnoses     Gross hematuria    -  Primary                   Final Clinical Impression(s) / ED Diagnoses Final diagnoses:  Gross hematuria    Rx / DC Orders ED Discharge Orders     None         Adamary Savary, Mayer Masker, MD 02/08/23 [id number]

## 2023-03-25 ENCOUNTER — Other Ambulatory Visit: Payer: Self-pay

## 2023-03-25 ENCOUNTER — Inpatient Hospital Stay (HOSPITAL_COMMUNITY)
Admission: EM | Admit: 2023-03-25 | Discharge: 2023-03-28 | DRG: 378 | Disposition: A | Discharge status: Home / Self Care | Payer: No Typology Code available for payment source | Attending: Family Medicine | Admitting: Family Medicine

## 2023-03-25 ENCOUNTER — Encounter (HOSPITAL_COMMUNITY): Payer: Self-pay

## 2023-03-25 DIAGNOSIS — R7303 Prediabetes: Secondary | ICD-10-CM | POA: Diagnosis present

## 2023-03-25 DIAGNOSIS — I959 Hypotension, unspecified: Secondary | ICD-10-CM | POA: Diagnosis not present

## 2023-03-25 DIAGNOSIS — I110 Hypertensive heart disease with heart failure: Secondary | ICD-10-CM | POA: Diagnosis present

## 2023-03-25 DIAGNOSIS — Z7901 Long term (current) use of anticoagulants: Secondary | ICD-10-CM | POA: Diagnosis not present

## 2023-03-25 DIAGNOSIS — Z981 Arthrodesis status: Secondary | ICD-10-CM | POA: Diagnosis not present

## 2023-03-25 DIAGNOSIS — I951 Orthostatic hypotension: Secondary | ICD-10-CM | POA: Diagnosis present

## 2023-03-25 DIAGNOSIS — I4819 Other persistent atrial fibrillation: Secondary | ICD-10-CM | POA: Diagnosis present

## 2023-03-25 DIAGNOSIS — F17210 Nicotine dependence, cigarettes, uncomplicated: Secondary | ICD-10-CM | POA: Diagnosis present

## 2023-03-25 DIAGNOSIS — G8929 Other chronic pain: Secondary | ICD-10-CM | POA: Diagnosis present

## 2023-03-25 DIAGNOSIS — K922 Gastrointestinal hemorrhage, unspecified: Secondary | ICD-10-CM | POA: Diagnosis present

## 2023-03-25 DIAGNOSIS — I10 Essential (primary) hypertension: Secondary | ICD-10-CM | POA: Diagnosis present

## 2023-03-25 DIAGNOSIS — Z79899 Other long term (current) drug therapy: Secondary | ICD-10-CM

## 2023-03-25 DIAGNOSIS — E785 Hyperlipidemia, unspecified: Secondary | ICD-10-CM | POA: Diagnosis present

## 2023-03-25 DIAGNOSIS — I2510 Atherosclerotic heart disease of native coronary artery without angina pectoris: Secondary | ICD-10-CM | POA: Diagnosis present

## 2023-03-25 DIAGNOSIS — F1920 Other psychoactive substance dependence, uncomplicated: Secondary | ICD-10-CM | POA: Diagnosis present

## 2023-03-25 DIAGNOSIS — I252 Old myocardial infarction: Secondary | ICD-10-CM | POA: Diagnosis not present

## 2023-03-25 DIAGNOSIS — K5900 Constipation, unspecified: Secondary | ICD-10-CM | POA: Diagnosis present

## 2023-03-25 DIAGNOSIS — K921 Melena: Secondary | ICD-10-CM | POA: Diagnosis not present

## 2023-03-25 DIAGNOSIS — K3182 Dieulafoy lesion (hemorrhagic) of stomach and duodenum: Secondary | ICD-10-CM | POA: Diagnosis present

## 2023-03-25 DIAGNOSIS — E669 Obesity, unspecified: Secondary | ICD-10-CM | POA: Diagnosis present

## 2023-03-25 DIAGNOSIS — K31811 Angiodysplasia of stomach and duodenum with bleeding: Secondary | ICD-10-CM | POA: Diagnosis not present

## 2023-03-25 DIAGNOSIS — Z8249 Family history of ischemic heart disease and other diseases of the circulatory system: Secondary | ICD-10-CM

## 2023-03-25 DIAGNOSIS — N400 Enlarged prostate without lower urinary tract symptoms: Secondary | ICD-10-CM | POA: Diagnosis present

## 2023-03-25 DIAGNOSIS — M48061 Spinal stenosis, lumbar region without neurogenic claudication: Secondary | ICD-10-CM | POA: Insufficient documentation

## 2023-03-25 DIAGNOSIS — Z96642 Presence of left artificial hip joint: Secondary | ICD-10-CM | POA: Diagnosis present

## 2023-03-25 DIAGNOSIS — Z951 Presence of aortocoronary bypass graft: Secondary | ICD-10-CM

## 2023-03-25 DIAGNOSIS — M545 Low back pain, unspecified: Secondary | ICD-10-CM | POA: Diagnosis present

## 2023-03-25 DIAGNOSIS — D62 Acute posthemorrhagic anemia: Secondary | ICD-10-CM

## 2023-03-25 DIAGNOSIS — Z7985 Long-term (current) use of injectable non-insulin antidiabetic drugs: Secondary | ICD-10-CM

## 2023-03-25 DIAGNOSIS — I5022 Chronic systolic (congestive) heart failure: Secondary | ICD-10-CM | POA: Diagnosis present

## 2023-03-25 DIAGNOSIS — G4733 Obstructive sleep apnea (adult) (pediatric): Secondary | ICD-10-CM | POA: Diagnosis present

## 2023-03-25 DIAGNOSIS — I4892 Unspecified atrial flutter: Secondary | ICD-10-CM | POA: Diagnosis present

## 2023-03-25 DIAGNOSIS — Z6832 Body mass index (BMI) 32.0-32.9, adult: Secondary | ICD-10-CM | POA: Diagnosis not present

## 2023-03-25 LAB — CBC
HCT: 28.7 % — ABNORMAL LOW (ref 39.0–52.0)
Hemoglobin: 9.7 g/dL — ABNORMAL LOW (ref 13.0–17.0)
MCH: 30.1 pg (ref 26.0–34.0)
MCHC: 33.8 g/dL (ref 30.0–36.0)
MCV: 89.1 fL (ref 80.0–100.0)
Platelets: 130 10*3/uL — ABNORMAL LOW (ref 150–400)
RBC: 3.22 MIL/uL — ABNORMAL LOW (ref 4.22–5.81)
RDW: 14.9 % (ref 11.5–15.5)
WBC: 6 10*3/uL (ref 4.0–10.5)
nRBC: 0 % (ref 0.0–0.2)

## 2023-03-25 LAB — URINALYSIS, ROUTINE W REFLEX MICROSCOPIC
Bacteria, UA: NONE SEEN
Bilirubin Urine: NEGATIVE
Glucose, UA: NEGATIVE mg/dL
Hgb urine dipstick: NEGATIVE
Ketones, ur: NEGATIVE mg/dL
Leukocytes,Ua: NEGATIVE
Nitrite: NEGATIVE
Protein, ur: 100 mg/dL — AB
Specific Gravity, Urine: 1.023 (ref 1.005–1.030)
pH: 6 (ref 5.0–8.0)

## 2023-03-25 LAB — BASIC METABOLIC PANEL
Anion gap: 5 (ref 5–15)
BUN: 17 mg/dL (ref 8–23)
CO2: 24 mmol/L (ref 22–32)
Calcium: 8.3 mg/dL — ABNORMAL LOW (ref 8.9–10.3)
Chloride: 109 mmol/L (ref 98–111)
Creatinine, Ser: 0.87 mg/dL (ref 0.61–1.24)
GFR, Estimated: >60 mL/min (ref >60)
Glucose, Bld: 94 mg/dL (ref 70–99)
Potassium: 3.9 mmol/L (ref 3.5–5.1)
Sodium: 138 mmol/L (ref 135–145)

## 2023-03-25 LAB — POC OCCULT BLOOD, ED: Fecal Occult Bld: POSITIVE — AB

## 2023-03-25 LAB — HEMOGLOBIN: Hemoglobin: 9 g/dL — ABNORMAL LOW (ref 13.0–17.0)

## 2023-03-25 LAB — HIV ANTIBODY (ROUTINE TESTING W REFLEX): HIV Screen 4th Generation wRfx: NONREACTIVE

## 2023-03-25 LAB — CBG MONITORING, ED: Glucose-Capillary: 88 mg/dL (ref 70–99)

## 2023-03-25 MED ORDER — TRIAMCINOLONE ACETONIDE 0.5 % EX CREA
TOPICAL_CREAM | Freq: Two times a day (BID) | CUTANEOUS | Status: DC
  Filled 2023-03-25: qty 15

## 2023-03-25 MED ORDER — IPRATROPIUM-ALBUTEROL 0.5-2.5 (3) MG/3ML IN SOLN: 3.0000 mL | Freq: Four times a day (QID) | RESPIRATORY_TRACT | Status: DC | PRN

## 2023-03-25 MED ORDER — MELATONIN 3 MG PO TABS
3.0000 mg | ORAL_TABLET | Freq: Every day | ORAL | Status: DC
  Administered 2023-03-25 – 2023-03-27 (×3): 3 mg via ORAL
  Filled 2023-03-25 (×3): qty 1

## 2023-03-25 MED ORDER — GABAPENTIN 100 MG PO CAPS
100.0000 mg | ORAL_CAPSULE | Freq: Every day | ORAL | Status: DC
  Administered 2023-03-25 – 2023-03-27 (×3): 100 mg via ORAL
  Filled 2023-03-25 (×3): qty 1

## 2023-03-25 MED ORDER — SODIUM CHLORIDE 0.9 % IV SOLN: INTRAVENOUS | Status: DC

## 2023-03-25 MED ORDER — POLYETHYLENE GLYCOL 3350 17 G PO PACK
17.0000 g | PACK | Freq: Every day | ORAL | Status: DC | PRN
  Administered 2023-03-26 – 2023-03-27 (×2): 17 g via ORAL
  Filled 2023-03-25 (×2): qty 1

## 2023-03-25 MED ORDER — HYDROXYCHLOROQUINE SULFATE 200 MG PO TABS
400.0000 mg | ORAL_TABLET | Freq: Every day | ORAL | Status: DC
  Administered 2023-03-27 – 2023-03-28 (×2): 400 mg via ORAL
  Filled 2023-03-25 (×2): qty 2

## 2023-03-25 MED ORDER — ROSUVASTATIN CALCIUM 20 MG PO TABS
40.0000 mg | ORAL_TABLET | Freq: Every day | ORAL | Status: DC
  Administered 2023-03-25 – 2023-03-27 (×3): 40 mg via ORAL
  Filled 2023-03-25 (×3): qty 2

## 2023-03-25 MED ORDER — PANTOPRAZOLE SODIUM 40 MG IV SOLR
40.0000 mg | Freq: Two times a day (BID) | INTRAVENOUS | Status: DC
  Administered 2023-03-25 – 2023-03-27 (×4): 40 mg via INTRAVENOUS
  Filled 2023-03-25 (×4): qty 10

## 2023-03-25 MED ORDER — NICOTINE 7 MG/24HR TD PT24
7.0000 mg | MEDICATED_PATCH | Freq: Every day | TRANSDERMAL | Status: DC
  Administered 2023-03-25 – 2023-03-27 (×2): 7 mg via TRANSDERMAL
  Filled 2023-03-25 (×4): qty 1

## 2023-03-25 MED ORDER — SODIUM CHLORIDE 0.9 % IV BOLUS
500.0000 mL | Freq: Once | INTRAVENOUS | Status: AC
  Administered 2023-03-25: 500 mL via INTRAVENOUS

## 2023-03-25 NOTE — Hospital Course (Addendum)
Cameron Schultz. is a 67 y.o.male with a history of A-fib/a flutter status post ablation 2021, CAD status post CABG 2013, HFmrEF, history of GI bleed 2018 and 2020 with previous ulcer and Dieulafoy lesion on EGD, obesity, OSA, hyperlipidemia who was admitted to the family medicine teaching Service at [facility] for lightheadedness and dark stools. His hospital course is detailed below:  Concern for GI bleed ABLA Patient presented to the ED with dark stools, history of GI bleed and currently on Xarelto.  FOBT positive.  Anemia to 9.7.  Received fluids while in the ED and was admitted to the hospitalist service for further evaluation and treatment.  Patient was started on twice daily IV PPI and GI was consulted.  GI continued with scope that showed***.  Hypotension  Orthostasis  In setting of ABLA   Other chronic conditions were medically managed with home medications and formulary alternatives as necessary (***)  PCP Follow-up Recommendations:

## 2023-03-25 NOTE — Progress Notes (Signed)
Cameron Schultz is a pleasant 67 yo M with a PMH of Afib/Aflutter s/p ablation in 2021, CAD s/p CABG in 2013, HFmrEF, h/o GI bleed in 2018 and 2020 with previous ulcer and Dieulafoy lesion on EGD, obesity, OSA, HLD who presents with 5 days of feeling lightheaded esp with leaning forward and position changes, as well as 1 day of darker stools. He notes while visiting his daughter starting this past Monday he was lightheaded with position changes, didn't improve on Wednesday and had home BP 90s/70s, [facility] and he held his home lisinopril yesterday. Then last night he had a darker stool, not tarry or coffee ground or bright red, and another this morning and he thought maybe he was having bleeding and came to ED. Denies abdominal pain, chest pain, dyspnea. Just feels lightheaded with position changes. Only recent med changes added hydroxychloroquine for possible Lupus. Does not take NSAIDs, does not drink. Last took his Xarelto and other meds this AM.  On Exam, RRR no murmurs. Lungs CTA. No edema bilaterally. Abd soft, non-tender without guarding or rebound.   Lab work notable Hgb 9.7, was 13 one month ago. FOBT +. EKG showing NSR, no ST segment changes or TWI.  Vitals 110s/80s, he held his lisinopril but took his beta blocker today.  1. Acute blood loss anemia, Melena with concern for upper GI bleed- appreciate GI consultation, BID IV PPI, EGD tomorrow. Type and screen ordered, trend Hgb. Transfusion threshold <8.  2. Hypotension/orthostasis- plan for orthostatics tomorrow after EGD, GI bleed could definitely be contributing. He denies any chest pain or other cardiac symptoms. Continue to hold home lisinopril and Bblocker. Consider repeat ECHO if not improving with resolving bleed. 3. HFmrEF- no signs of exacerbation today. Holding home beta blocker. 4. Afib/Aflutter- holding beta blocker in setting of hypotension, holding Xarelto in setting of GI bleed. SCDs for DVT ppx.   Will sign resident H&P  as available.

## 2023-03-25 NOTE — Progress Notes (Cosign Needed Addendum)
FMTS Brief Progress Note  S: On interview, patient was resting in bed.  Does not have any new concerns or symptoms at this time.  Comfortably watching TV.   O: BP 116/80 (BP Location: Left Arm)   Pulse 76   Temp 98.3 F (36.8 C) (Oral)   Resp 13   Ht 6\' 1"  (1.854 m)   Wt 113.4 kg   SpO2 100%   BMI 32.98 kg/m    Physical exam Constitutional: NAD, attentive to interview. Cardiovascular: RRR, no M/R/G. Respiratory: CTA in anterior lung fields Abdominal: Soft MSK: ROM intact. Neuro: No gross deficits. Psych: Appropriate mood and affect.  A/P: Acute blood loss anemia - Orders reviewed. Labs for AM ordered, which were adjusted as needed. - Patient stable this evening.  Hemoglobin: 9.0.  EGD tomorrow. Holding home Coreg for now, can restart if tachy overnight. - If condition changes, plan includes reevaluation.   Tomie China, MD 03/25/2023, 8:20 PM PGY-1, [facility] Family Medicine Night Resident  Please page [phone] with questions.

## 2023-03-25 NOTE — Progress Notes (Signed)
Pt received to 6N22 from the ED, alert and oriented x 4, wife at bedside.

## 2023-03-25 NOTE — Anesthesia Preprocedure Evaluation (Addendum)
Anesthesia Evaluation  Patient identified by MRN, date of birth, ID band Patient awake    Reviewed: Allergy & Precautions, NPO status , Patient's Chart, lab work & pertinent test results  Airway Mallampati: II  TM Distance: >3 FB Neck ROM: Full    Dental  (+) Dental Advisory Given   Pulmonary sleep apnea , Patient abstained from smoking., former smoker   Pulmonary exam normal breath sounds clear to auscultation       Cardiovascular hypertension, Pt. on home beta blockers + CAD, + Past MI and + CABG  Normal cardiovascular exam Rhythm:Regular Rate:Normal  Echo 09/2019  1. Left ventricular ejection fraction, by estimation, is 45 to 50%. The left ventricle has mildly decreased function. The left ventricle demonstrates regional wall motion abnormalities (see scoring diagram/findings for description). There is moderate left ventricular hypertrophy. Left ventricular diastolic parameters are consistent with Grade I diastolic dysfunction (impaired relaxation). There is moderate hypokinesis of the left ventricular, basal-mid inferior wall.   2. Right ventricular systolic function is mildly reduced. The right ventricular size is normal.   3. Left atrial size was severely dilated.   4. The mitral valve is abnormal. Mild mitral valve regurgitation.   5. The aortic valve is tricuspid. Aortic valve regurgitation is not visualized.   6. The inferior vena cava is dilated in size with >50% respiratory variability, suggesting right atrial pressure of 8 mmHg.   Comparison(s): Changes from prior study are noted. 05/31/19: LVEF 50-55%.    Stress test 09/2019  The left ventricular ejection fraction is moderately decreased (30-44%).  Nuclear stress EF: 37%.  There was no ST segment deviation noted during stress.  No T wave inversion was noted during stress.  Findings consistent with prior myocardial infarction.  This is an intermediate risk study.    1. There is a medium size (6-8% of LV), severe, fixed perfusion defect present in the basal to mid inferolateral segments consistent with prior infarction.  2. There is no ischemia. 3. Dilated LV with reduced LVEF, 37%.  4. This is an intermediate risk study.      Neuro/Psych  Neuromuscular disease    GI/Hepatic negative GI ROS, Neg liver ROS,,,  Endo/Other  negative endocrine ROS    Renal/GU negative Renal ROS     Musculoskeletal  (+) Arthritis ,    Abdominal  (+) + obese  Peds  Hematology  (+) Blood dyscrasia, anemia   Anesthesia Other Findings   Reproductive/Obstetrics                             Anesthesia Physical Anesthesia Plan  ASA: 3  Anesthesia Plan: MAC   Post-op Pain Management: Minimal or no pain anticipated   Induction: Intravenous  PONV Risk Score and Plan: 2 and Ondansetron, Treatment may vary due to age or medical condition, Propofol infusion and TIVA  Airway Management Planned:   Additional Equipment:   Intra-op Plan:   Post-operative Plan:   Informed Consent: I have reviewed the patients History and Physical, chart, labs and discussed the procedure including the risks, benefits and alternatives for the proposed anesthesia with the patient or authorized representative who has indicated his/her understanding and acceptance.     Dental advisory given  Plan Discussed with: CRNA  Anesthesia Plan Comments:         Anesthesia Quick Evaluation

## 2023-03-25 NOTE — Assessment & Plan Note (Signed)
Report of dark stools with anemia to 9.7. History of gastric ulcer and Dieulafoy lesion clipped in 2020. Hemodynamically stable with no signs of active bleeding. GI consulted. --Admit to FMTS, med-tele, Dr Miquel Dunn attending --GI planning for EGD tomorrow AM, will be NPO at midnight --Protonix IV 40mg  BID --continue 33mL/hr NS --maintain 2 large bore IV --hold Xarelto --PM CBC --AM CMP and CBC pre-procedure per GI --transfusion threshold <8.0 in pt with CAD

## 2023-03-25 NOTE — ED Provider Notes (Signed)
[place] EMERGENCY DEPARTMENT AT [facility] Provider Note   CSN: [id number] Arrival date & time: 03/25/23  6962     History  Chief Complaint  Patient presents with  . Dizziness    Cameron Schultz. is a 67 y.o. male.  With a past medical history of atrial fibrillation on Xarelto, hypertension and MI who presents to the ED for dizziness.  Patient reports lightheadedness and episodes of near syncope a few times over the last week.  He has a blood pressure cuff at home and has been measuring his systolic blood pressure in the 70s.  He called his doctor's office at the [place] who instructed him to hold his antihypertensive regimen this morning.  No chest pain, shortness of breath, nausea, vomiting or diaphoresis.  No fevers chills or recent illness.   Dizziness      Home Medications Prior to Admission medications   Medication Sig Start Date End Date Taking? Authorizing Provider  acetaminophen (TYLENOL) 325 MG tablet Take 650 mg by mouth every 6 (six) hours as needed for moderate pain. 12/01/18   [provider]  adapalene (DIFFERIN) 0.1 % gel Apply 1 Application topically daily.    [provider]  albuterol (VENTOLIN HFA) 108 (90 Base) MCG/ACT inhaler Inhale 1 puff into the lungs every 6 (six) hours as needed for wheezing or shortness of breath.    [provider]  Ascorbic Acid (VITAMIN C) 1000 MG tablet Take 1,000 mg by mouth daily.    [provider]  betamethasone dipropionate 0.05 % cream Apply 1 application topically daily as needed (irritation).    [provider]  carvedilol (COREG) 25 MG tablet Take 12.5 mg by mouth 2 (two) times daily with a meal.    [provider]  cephALEXin (KEFLEX) 500 MG capsule Take 500 mg by mouth 4 (four) times daily. X7 days 03/16/23   [provider]  cyclobenzaprine (FLEXERIL) 10 MG tablet Take 10 mg by mouth at bedtime as needed for muscle spasms.    [provider]   diclofenac Sodium (VOLTAREN) 1 % GEL Apply 1 application topically daily as needed (pain).    [provider]  DULoxetine (CYMBALTA) 60 MG capsule Take 120 mg by mouth at bedtime.    [provider]  ergocalciferol (VITAMIN D2) 1.25 MG (50000 UT) capsule Take 50,000 Units by mouth once a week.    [provider]  gabapentin (NEURONTIN) 100 MG capsule Take 100 mg by mouth at bedtime.    [provider]  hydroxychloroquine (PLAQUENIL) 200 MG tablet Take 400 mg by mouth daily.    [provider]  ketoconazole (NIZORAL) 2 % cream Apply 1 Application topically 2 (two) times daily.    [provider]  lisinopril (ZESTRIL) 40 MG tablet Take 40 mg by mouth daily.    [provider]  Melatonin 3 MG CAPS Take 3 mg by mouth at bedtime as needed (sleep).    [provider]  NONFORMULARY OR COMPOUNDED ITEM 0.5-1 mLs by Intracavernosal route daily as needed (ED). Prostaglandin 10mg /mL Papaverine 30mg /mL Phentolamine 1mg /mL    [provider]  pantoprazole (PROTONIX) 40 MG tablet Take 1 tablet (40 mg total) by mouth daily. 12/23/16   Marquette Saa, MD  polyethylene glycol (MIRALAX / GLYCOLAX) 17 g packet Take 17 g by mouth daily as needed for moderate constipation.    [provider]  rivaroxaban (XARELTO) 20 MG TABS tablet Take 20 mg by mouth  daily with breakfast.     [provider]  rosuvastatin (CRESTOR) 40 MG tablet Take 40 mg by mouth at bedtime.    [provider]  SEMAGLUTIDE, 2 MG/DOSE, [place] Inject 2 mg into the skin once a week.    [provider]  torsemide (DEMADEX) 20 MG tablet Take 10 mg by mouth daily.    [provider]  traZODone (DESYREL) 50 MG tablet Take 25 mg by mouth at bedtime.    [provider]      Allergies    Patient has no known allergies.    Review of Systems   Review of Systems  Neurological:  Positive for dizziness.    Physical  Exam Updated Vital Signs BP 113/75 (BP Location: Left Arm)   Pulse 72   Temp 98 F (36.7 C) (Oral)   Resp 18   Ht 6\' 1"  (1.854 m)   Wt 113.4 kg   SpO2 100%   BMI 32.98 kg/m  Physical Exam Vitals and nursing note reviewed.  HENT:     Head: Normocephalic and atraumatic.  Eyes:     Pupils: Pupils are equal, round, and reactive to light.  Cardiovascular:     Rate and Rhythm: Normal rate and regular rhythm.  Pulmonary:     Effort: Pulmonary effort is normal.     Breath sounds: Normal breath sounds.  Abdominal:     Palpations: Abdomen is soft.     Tenderness: There is no abdominal tenderness.  Musculoskeletal:     Right lower leg: No edema.     Left lower leg: No edema.  Skin:    General: Skin is warm and dry.  Neurological:     Mental Status: He is alert.  Psychiatric:        Mood and Affect: Mood normal.     ED Results / Procedures / Treatments   Labs (all labs ordered are listed, but only abnormal results are displayed) Labs Reviewed  BASIC METABOLIC PANEL - Abnormal; Notable for the following components:      Result Value   Calcium 8.3 (*)    All other components within normal limits  CBC - Abnormal; Notable for the following components:   RBC 3.22 (*)    Hemoglobin 9.7 (*)    HCT 28.7 (*)    Platelets 130 (*)    All other components within normal limits  URINALYSIS, ROUTINE W REFLEX MICROSCOPIC - Abnormal; Notable for the following components:   APPearance HAZY (*)    Protein, ur 100 (*)    All other components within normal limits  POC OCCULT BLOOD, ED - Abnormal; Notable for the following components:   Fecal Occult Bld POSITIVE (*)    All other components within normal limits  HEMOGLOBIN  CBG MONITORING, ED  POC OCCULT BLOOD, ED    EKG EKG Interpretation Date/Time:  Friday March 25 2023 09:41:23 EDT Ventricular Rate:  82 PR Interval:  164 QRS Duration:  94 QT Interval:  394 QTC Calculation: 460 R Axis:   -20  Text Interpretation: Normal  sinus rhythm Minimal voltage criteria for LVH, may be normal variant ( R in aVL ) Inferior infarct , age undetermined Abnormal ECG When compared with ECG of 20-Nov-2019 11:43, PREVIOUS ECG IS PRESENT Confirmed by Estelle June [phone]) on 03/25/2023 3:15:27 PM  Radiology No results found.  Procedures .Critical Care  Performed by: Royanne Foots, DO Authorized by: Royanne Foots, DO   Critical care provider statement:  Critical care time (minutes):  30   Critical care time was exclusive of:  Separately billable procedures and treating other patients and teaching time   Critical care was necessary to treat or prevent imminent or life-threatening deterioration of the following conditions:  Circulatory failure   Critical care was time spent personally by me on the following activities:  Development of treatment plan with patient or surrogate, discussions with consultants, evaluation of patient's response to treatment, examination of patient, ordering and review of laboratory studies, ordering and review of radiographic studies, ordering and performing treatments and interventions, pulse oximetry, re-evaluation of patient's condition, review of old charts and blood draw for specimens   I assumed direction of critical care for this patient from another provider in my specialty: no     Care discussed with: admitting provider       Medications Ordered in ED Medications  pantoprazole (PROTONIX) injection 40 mg (has no administration in time range)  sodium chloride 0.9 % bolus 500 mL (0 mLs Intravenous Stopped 03/25/23 1407)    ED Course/ Medical Decision Making/ A&P Clinical Course as of 03/25/23 1530  Fri Mar 25, 2023  1430 Reviewed laboratory workup.  No significant electrolyte disturbance on metabolic panel.  Renal function at baseline.  Proteinuria but no evidence of hematuria or UTI.  CBC shows no leukocytosis but does show significant drop in H&H from baseline.  H&H of 9.7 28.7 today.   Platelets 130.  Discussed these laboratory findings with the patient and his wife at bedside he does now mention that he has had dark stool over the last 2 days and has a history of melena.  Rectal exam notable for positive fecal occult testing.  I discussed this with GI on call who will evaluate patient in the ED for a nonemergent scope.  Given the patient was symptomatic and hypotensive today admission would be the best plan for him.  Will discuss with admitting hospitalist and admit to medicine [MP]  [address] [facility] [CC]    Clinical Course User Index [CC] Glyn Ade, MD [MP] Royanne Foots, DO                                 Medical Decision Making 67 year old male with history as above presenting for episodic lightheadedness and recent episode of near syncope with hypotension.  Multiple episodes of hypotension at home.  No chest pain or shortness of breath.  Low suspicion for ACS.  Held antihypertensives this morning.  Notes that he has had decreased liquid intake recently but tried to increase yesterday.  Suspect episodes may be due to hypovolemia and will provide IV fluids here for rehydration.  Will evaluate for dysrhythmia with EKG, electrolyte imbalance with metabolic panel and anemia with CBC.  If he remains hemodynamically stable and workup looks like I suspect he can go home today with plan for [facility]  Amount and/or Complexity of Data Reviewed Labs: ordered.  Risk Decision regarding hospitalization.           Final Clinical Impression(s) / ED Diagnoses Final diagnoses:  Gastrointestinal hemorrhage, unspecified gastrointestinal hemorrhage type  Hypotension, unspecified hypotension type    Rx / DC Orders ED Discharge Orders     None         Royanne Foots, DO 03/25/23 1517

## 2023-03-25 NOTE — H&P (Cosign Needed Addendum)
Hospital Admission History and Physical Service Pager: [phone]  Patient name: Cameron Schultz. Medical record number: [id number] Date of Birth: 07-28-1955 Age: 67 y.o. Gender: male  Primary Care Provider: [name] Consultants: GI Code Status: FULL which was confirmed with family if patient unable to confirm   Preferred Emergency Contact:  Contact Information     Name Relation Home Work Mobile   [place] Spouse [phone]  [phone]      Other Contacts   None on File    [phone] is Greta's number   Chief Complaint: Dizziness, dark colored stools  Assessment and Plan: Cameron Schultz. is a 67 y.o. male presenting with lightheadedness and dark-colored stools . Differential for this patient's presentation of this includes: Hypotension:  -Orthostasis/postural (possible given position related symptoms) -Reduced PO intake/dehydration (unlikely given report of normal PO intake and BMP WNL) Dark-colored stools: -Bleeding peptic ulcer (likely given history, though prior was not bleeding) -Repeat Diualfoy lesions (likely given history of this)  -Esophagitis (does not report reflux symptoms) -GI malignancy (appears that patient has had colonoscopy in the past, hx of tobacco use increases risk) Assessment & Plan Gastrointestinal hemorrhage, unspecified gastrointestinal hemorrhage type Report of dark stools with anemia to 9.7. History of gastric ulcer and Dieulafoy lesion clipped in 2020. Hemodynamically stable with no signs of active bleeding. GI consulted. --Admit to [facility]MTS, med-tele, Dr Miquel Dunn attending --GI planning for EGD tomorrow AM, will be NPO at midnight --Protonix IV 40mg  BID --continue 30mL/hr NS --maintain 2 large bore IV --hold Xarelto --PM CBC --AM CMP and CBC pre-procedure per GI --transfusion threshold <8.0 in pt with CAD  Hypotension, unspecified hypotension type Reports of lightheadedness and pressures as low as 70s/30s  prior to admission. S/p bolus of NS, pressures now stable in the 100s/70s.  --hold home Lisinopril 40mg  daily and coreg 12.5 BID --continue IVF as above --monitor vitals per unit  --orthostatic vitals tomorrow    Chronic and Stable Conditions: Lupus: hydroxychloroquine 400mg  daily HLD: rosouvastatin 40mg  daily Tobacco use: nicotine patch Back pain: gabapentin 100mg  daily, hold flexiril 10mg  daily ?Shortness of breath: combivent PRN Obesity/prediabetes: A1c in 2023 5.7, hold semaglutide 2mg  weekly Edema: hold torsemide 10mg  PRN OSA: nightly CPAP Constipation: miralax daily PRN   FEN/GI: Regular diet until midnight, then NPO VTE Prophylaxis: SCD's, no pharmacologic prophylaxis given high risk for bleeding  Disposition: Progressive  History of Present Illness:  Cameron Schultz. is a 67 y.o. male presenting with dark-colored stools x2 (last night and this morning) and lightheadedness. Also some slight headaches.He was also feeling some palpitations.  No shortness of breath. No hematuria, epistaxis.  Does have some slight chest pain, since Monday- sharp pain.  From [place], went back to visit last week and noticed on Monday or Tuesday that he felt dizzy when bending over to tie his shoes. When he got home yesterday, noticed same thing was happening. He checked his blood pressures at home and they were 77/52, 88/50s, etc.  He was feeling lightheaded at [facility]. [facility] encouraged him to drink more water (he said he had not been drinking as much as usual) and to stop taking his lisinopril. BP this morning was 70s/50s.  With history of tarry stools s/p clipping, he had very dark tarry stools at that time.  Just started Hydroxychloroquine for new diagnosis of Lupus. Last took his Xarelto this morning.  In the ED, was found to be anemic to 9.7 with positive fecal occult blood.  He got 500cc bollus of NS. GI was consulted and recommended EGD tomorrow AM, protonix 40mg  IV BID, and  holding Xarelto.   Review Of Systems: Per HPI with the following additions:   Pertinent Past Medical History: Hx Dieulafoy lesion s/p clip Atrial fibrillation/flutter Hypertension CAD s/p CABG  HLD OSA  Remainder reviewed in history tab.   Pertinent Past Surgical History: Past Surgical History:  Procedure Laterality Date   ATRIAL FIBRILLATION ABLATION N/A 10/23/2019   Procedure: ATRIAL FIBRILLATION ABLATION;  Surgeon: Hillis Range, MD;  Location: [facility] INVASIVE CV LAB;  Service: Cardiovascular;  Laterality: N/A;   BACK SURGERY     CERVICAL FUSION     CORONARY ARTERY BYPASS GRAFT  2013   1   CORONARY ARTERY BYPASS GRAFT     [facility]   ESOPHAGOGASTRODUODENOSCOPY (EGD) WITH PROPOFOL N/A 12/21/2016   Procedure: ESOPHAGOGASTRODUODENOSCOPY (EGD) WITH PROPOFOL;  Surgeon: Kathi Der, MD;  Location: [facility] ENDOSCOPY;  Service: Gastroenterology;  Laterality: N/A;   TONSILLECTOMY     TOTAL HIP ARTHROPLASTY Left 02/22/2019   Procedure: TOTAL HIP ARTHROPLASTY ANTERIOR APPROACH;  Surgeon: Samson Frederic, MD;  Location: [facility] ORS;  Service: Orthopedics;  Laterality: Left;     Remainder reviewed in history tab.  Pertinent Social History: Tobacco use: Smokes 4 cigarettes a day, since 67 years of age Alcohol use: No Other Substance use: No Lives with wife Cameron Schultz)  Pertinent Family History: None pertinent  Remainder reviewed in history tab.   Important Outpatient Medications: Carvedilol 12.5 mg BID  Gabapentin 100 mg at bedtime Hydroxychloroquine 400 mg daily Lisniopril 40 mg daily Xarelto 20 mg daily Semaglutide 2 mg weekly  Remainder reviewed in medication history.   Objective: BP 111/70   Pulse 72   Temp 98 F (36.7 C) (Oral)   Resp 13   Ht 6\' 1"  (1.854 m)   Wt 113.4 kg   SpO2 100%   BMI 32.98 kg/m  Exam: General: older male sitting up in bed, pleasant and conversant, in NAD Eyes: normal and anicteric conjunctiva, symmetric pupils ENTM:  moist mucus membranes, no mucosal hemorrhage, no subungual pallor Neck: trachea midline, supple, no obvious masses or thyromegaly Cardiovascular: RRR, normal S1/S2, no murmurs, rubs, or gallops Respiratory: CTAB, normal WOB on RA, no wheezes, rales, or rhonchi Gastrointestinal: normoactive bowel sounds, soft, nontender, nondistended Derm: no obvious bruising, bleeding, or rashes Neuro: AAOx4, CN II-XII grossly intact, moving all extremities spontaneously Psych: Appropriate mood and affect  Labs:  CBC BMET  Recent Labs  Lab 03/25/23 1001  WBC 6.0  HGB 9.7*  HCT 28.7*  PLT 130*   Recent Labs  Lab 03/25/23 1001  NA 138  K 3.9  CL 109  CO2 24  BUN 17  CREATININE 0.87  GLUCOSE 94  CALCIUM 8.3*    POC fecal occult blood: positive  EKG: normal sinus rhythm, no ST elevation, inverted T waves in V1.   Imaging Studies Performed:  none   Lorayne Bender, MD 03/25/2023, 4:12 PM PGY-1, [facility] Family Medicine  FPTS Intern pager: [phone], text pages welcome Secure chat group [facility] Teaching Service   Upper Level Addendum:  I have seen and evaluated this patient along with Dr. Dolan Amen and reviewed the above note, making necessary revisions.  Darral Dash D.O. 03/25/2023, 7:20 PM PGY-3, [facility] Family Medicine

## 2023-03-25 NOTE — ED Notes (Signed)
ED TO INPATIENT HANDOFF REPORT  ED Nurse Name and Phone #: Dahlia Client [phone]  S Name/Age/Gender Cameron Schultz. 67 y.o. male Room/Bed: 016C/016C  Code Status   Code Status: Full Code  Home/SNF/Other Home Patient oriented to: self, place, time, and situation Is this baseline? Yes   Triage Complete: Triage complete  Chief Complaint GI bleed [K92.2]  Triage Note Pt to ED via pov from home. Pt c/o low blood pressure and dizziness for past week. Pt states he feels weak and almost passed out yesterday. Pt states BP was 77/53 this am. [facility] and was told not to take l[facility]ril, he did take his carvedilol this am.    Allergies No Known Allergies  Level of Care/Admitting Diagnosis ED Disposition     ED Disposition  Admit   Condition  --   Comment  Hospital Area: [facility] [[id number]]  Level of Care: Telemetry Medical [104]  May admit patient to [facility] or [facility] if equivalent level of care is available:: Yes  Covid Evaluation: Asymptomatic - no recent exposure (last 10 days) testing not required  Diagnosis: GI bleed [106269]  Admitting Physician: Lorayne Bender [[id number]]  Attending Physician: Billey Co [[id number]]  Certification:: I certify this patient will need inpatient services for at least 2 midnights  Expected Medical Readiness: 03/27/2023          B Medical/Surgery History Past Medical History:  Diagnosis Date   Acute upper GI bleed    Atrial fibrillation (HCC)    Atrial flutter (HCC)    Chronic anticoagulation    Coronary artery disease    Essential hypertension 08/18/2019   Gastrointestinal hemorrhage 12/20/2016   Hyperlipidemia 08/18/2019   Myocardial infarction (HCC)    unknown   Obesity (BMI 30-39.9) 08/18/2019   OSA on CPAP 08/18/2019   Sleep apnea    Tobacco abuse 08/18/2019   Past Surgical History:  Procedure Laterality Date   ATRIAL FIBRILLATION ABLATION N/A 10/23/2019   Procedure: ATRIAL FIBRILLATION  ABLATION;  Surgeon: Hillis Range, MD;  [facility] CV LAB;  Service: Cardiovascular;  Laterality: N/A;   BACK SURGERY     CERVICAL FUSION     CORONARY ARTERY BYPASS GRAFT  2013   1   CORONARY ARTERY BYPASS GRAFT     [facility]   ESOPHAGOGASTRODUODENOSCOPY (EGD) WITH PROPOFOL N/A 12/21/2016   Procedure: ESOPHAGOGASTRODUODENOSCOPY (EGD) WITH PROPOFOL;  Surgeon: Kathi Der, MD;  Location: [facility] ENDOSCOPY;  Service: Gastroenterology;  Laterality: N/A;   TONSILLECTOMY     TOTAL HIP ARTHROPLASTY Left 02/22/2019   Procedure: TOTAL HIP ARTHROPLASTY ANTERIOR APPROACH;  Surgeon: Samson Frederic, MD;  Location: [facility]S;  Service: Orthopedics;  Laterality: Left;     A IV Location/Drains/Wounds Patient Lines/Drains/Airways Status     Active Line/Drains/Airways     Name Placement date Placement time Site Days   Peripheral IV 03/25/23 20 G Anterior;Distal;Right Forearm 03/25/23  1057  Forearm  less than 1   Wound / Incision (Open or Dehisced) 05/05/21 Puncture Flank Left 05/05/21  0910  Flank  689            Intake/Output Last 24 hours No intake or output data in the 24 hours ending 03/25/23 1628  Labs/Imaging Results for orders placed or performed during the hospital encounter of 03/25/23 (from the past 48 hour(s))  Basic metabolic panel     Status: Abnormal   Collection Time: 03/25/23 10:01 AM  Result Value Ref  Range   Sodium 138 135 - 145 mmol/L   Potassium 3.9 3.5 - 5.1 mmol/L   Chloride 109 98 - 111 mmol/L   CO2 24 22 - 32 mmol/L   Glucose, Bld 94 70 - 99 mg/dL    Comment: Glucose reference range applies only to samples taken after fasting for at least 8 hours.   BUN 17 8 - 23 mg/dL   Creatinine, Ser 1.61 0.61 - 1.24 mg/dL   Calcium 8.3 (L) 8.9 - 10.3 mg/dL   GFR, Estimated >09 >60 mL/min    Comment: (NOTE) Calculated using the CKD-EPI Creatinine Equation (2021)    Anion gap 5 5 - 15    Comment: Performed at [facility], [address], [place], [place] [address]  CBC     Status: Abnormal   Collection Time: 03/25/23 10:01 AM  Result Value Ref Range   WBC 6.0 4.0 - 10.5 K/uL   RBC 3.22 (L) 4.22 - 5.81 MIL/uL   Hemoglobin 9.7 (L) 13.0 - 17.0 g/dL   HCT 81.1 (L) 91.4 - 78.2 %   MCV 89.1 80.0 - 100.0 fL   MCH 30.1 26.0 - 34.0 pg   MCHC 33.8 30.0 - 36.0 g/dL   RDW 95.6 21.3 - 08.6 %   Platelets 130 (L) 150 - 400 K/uL    Comment: REPEATED TO VERIFY   nRBC 0.0 0.0 - 0.2 %    Comment: Performed at [facility], [address], [place], [place] [address]  Urinalysis, Routine w reflex microscopic -Urine, Clean Catch     Status: Abnormal   Collection Time: 03/25/23 10:03 AM  Result Value Ref Range   Color, Urine YELLOW YELLOW   APPearance HAZY (A) CLEAR   Specific Gravity, Urine 1.023 1.005 - 1.030   pH 6.0 5.0 - 8.0   Glucose, UA NEGATIVE NEGATIVE mg/dL   Hgb urine dipstick NEGATIVE NEGATIVE   Bilirubin Urine NEGATIVE NEGATIVE   Ketones, ur NEGATIVE NEGATIVE mg/dL   Protein, ur 962 (A) NEGATIVE mg/dL   Nitrite NEGATIVE NEGATIVE   Leukocytes,Ua NEGATIVE NEGATIVE   RBC / HPF 0-5 0 - 5 RBC/hpf   WBC, UA 6-10 0 - 5 WBC/hpf   Bacteria, UA NONE SEEN NONE SEEN   Squamous Epithelial / HPF 0-5 0 - 5 /HPF   Mucus PRESENT    Granular Casts, UA PRESENT     Comment: Performed at [facility], [address] [place], [place] [address]  CBG monitoring, ED     Status: None   Collection Time: 03/25/23 10:07 AM  Result Value Ref Range   Glucose-Capillary 88 70 - 99 mg/dL    Comment: Glucose reference range applies only to samples taken after fasting for at least 8 hours.  POC occult blood, ED     Status: Abnormal   Collection Time: 03/25/23  2:33 PM  Result Value Ref Range   Fecal Occult Bld POSITIVE (A) NEGATIVE   No results found.  Pending Labs Unresulted Labs (From admission, onward)     Start     Ordered   03/26/23 0600  Comprehensive metabolic panel  Tomorrow morning,   R        03/25/23 1504    03/25/23 1800  Hemoglobin  Now then every 12 hours,   R      03/25/23 1504   03/25/23 1621  HIV Antibody (routine testing w rflx)  (HIV Antibody (Routine testing w reflex) panel)  Once,  R        03/25/23 1623            Vitals/Pain Today's Vitals   03/25/23 1215 03/25/23 1245 03/25/23 1410 03/25/23 1515  BP: 100/73 101/61 113/75 111/70  Pulse: 70  72   Resp: 16 19 18 13   Temp:   98 F (36.7 C)   TempSrc:   Oral   SpO2: 99%  100%   Weight:      Height:      PainSc:        Isolation Precautions No active isolations  Medications Medications  0.9 %  sodium chloride infusion (has no administration in time range)  pantoprazole (PROTONIX) injection 40 mg (has no administration in time range)  nicotine (NICODERM CQ - dosed in mg/24 hr) patch 7 mg (has no administration in time range)  sodium chloride 0.9 % bolus 500 mL (0 mLs Intravenous Stopped 03/25/23 1407)    Mobility walks     Focused Assessments     R Recommendations: See Admitting Provider Note  Report given to:   Additional Notes:

## 2023-03-25 NOTE — H&P (View-Only) (Signed)
Consultation  Referring Provider:  Dr. Elayne Snare    Primary Care Physician:  [name] Primary Gastroenterologist:  Gentry Fitz       Reason for Consultation: Anemia and "dark stools"             HPI:   Cameron Schultz. is a 67 y.o. male with a past medical history as listed below including A-fib on Xarelto (08/20/2019 echo with LVEF 45-50%), prior MI and multiple others, who initially presented to the ED today with dizziness.  Found to be anemic with further description of "dark stools".    At time of presentation patient described being lightheaded with episodes of near syncope a few times over the past week, apparently is measuring blood pressure at home with a systolic noted in the 70s.  Called his doctor's office at the [place] who told him to hold antihypertensive regimen.    Today, patient seen with wife by bedside who assists with history.  He explains that he really did not know what was going on but started feeling dizzy and weak with some hypotension at home.  Tells me though that he did notice a dark formed stool yesterday evening and then again this morning.  This has happened to him before previously in 2018 and 2020 as outlined below, but he tells me the stool this time was not tarry and sticky like it was previously and was still formed so he did not really think much about it.  He does take Xarelto with his last dose this morning.  No abdominal pain and remains on Pantoprazole 40 mg daily.    Apparently he had a brother who died from an upper GI bleed it sounds like.    Denies fever, chills or weight loss.  ER course: Hemoglobin 9.7 (13.3 on 02/07/2023), platelets 130, Hemoccult positive, BUN normal  GI history: 02/08/2023 CT renal stone study showed moderate amount of retained stool suggesting constipation and enlarged prostate gland 12/06/2018 patient admitted the hospital in [place] for blood in stool, apparently had EGD there with evidence of Diuelafoy lesion with  recent bleeding and white nipple like projection with visible vessel status post Hemoclip x 1 12/17/2016 EGD done for melena with nonbleeding gastric ulcer and normal duodenum to the second portion as well as gastritis  Past Medical History:  Diagnosis Date   Acute upper GI bleed    Atrial fibrillation (HCC)    Atrial flutter (HCC)    Chronic anticoagulation    Coronary artery disease    Essential hypertension 08/18/2019   Hyperlipidemia 08/18/2019   Myocardial infarction (HCC)    unknown   Obesity (BMI 30-39.9) 08/18/2019   OSA on CPAP 08/18/2019   Sleep apnea    Tobacco abuse 08/18/2019    Past Surgical History:  Procedure Laterality Date   ATRIAL FIBRILLATION ABLATION N/A 10/23/2019   Procedure: ATRIAL FIBRILLATION ABLATION;  Surgeon: Hillis Range, MD;  [facility]: [facility];  Service: Cardiovascular;  Laterality: N/A;   BACK SURGERY     CERVICAL FUSION     CORONARY ARTERY BYPASS GRAFT  2013   1   CORONARY ARTERY BYPASS GRAFT     [facility]   ESOPHAGOGASTRODUODENOSCOPY (EGD) WITH PROPOFOL N/A 12/21/2016   Procedure: ESOPHAGOGASTRODUODENOSCOPY (EGD) WITH PROPOFOL;  Surgeon: Kathi Der, MD;  Location: [facility];  Service: Gastroenterology;  Laterality: N/A;   TONSILLECTOMY     TOTAL HIP ARTHROPLASTY Left 02/22/2019   Procedure: TOTAL  HIP ARTHROPLASTY ANTERIOR APPROACH;  Surgeon: Samson Frederic, MD;  Location: [facility];  Service: Orthopedics;  Laterality: Left;    Family History  Problem Relation Age of Onset   Heart attack Father        in his 94s    Social History   Tobacco Use   Smoking status: Former    Current packs/day: 0.00    Average packs/day: 0.3 packs/day for 15.0 years (3.8 ttl pk-yrs)    Types: Cigarettes    Start date: 10/05/2006    Quit date: 10/04/2021    Years since quitting: 1.4   Smokeless tobacco: Never   Tobacco comments:    3 cigarettes daily  Vaping Use   Vaping status: Never Used  Substance Use Topics    Alcohol use: No   Drug use: No    Prior to Admission medications   Medication Sig Start Date End Date Taking? Authorizing Provider  acetaminophen (TYLENOL) 325 MG tablet Take 650 mg by mouth every 6 (six) hours as needed for moderate pain. 12/01/18   [provider]  adapalene (DIFFERIN) 0.1 % gel Apply 1 Application topically daily.    [provider]  albuterol (VENTOLIN HFA) 108 (90 Base) MCG/ACT inhaler Inhale 1 puff into the lungs every 6 (six) hours as needed for wheezing or shortness of breath.    [provider]  Ascorbic Acid (VITAMIN C) 1000 MG tablet Take 1,000 mg by mouth daily.    [provider]  betamethasone dipropionate 0.05 % cream Apply 1 application topically daily as needed (irritation).    [provider]  carvedilol (COREG) 25 MG tablet Take 12.5 mg by mouth 2 (two) times daily with a meal.    [provider]  cephALEXin (KEFLEX) 500 MG capsule Take 500 mg by mouth 4 (four) times daily. X7 days 03/16/23   [provider]  cyclobenzaprine (FLEXERIL) 10 MG tablet Take 10 mg by mouth at bedtime as needed for muscle spasms.    [provider]  diclofenac Sodium (VOLTAREN) 1 % GEL Apply 1 application topically daily as needed (pain).    [provider]  DULoxetine (CYMBALTA) 60 MG capsule Take 120 mg by mouth at bedtime.    [provider]  ergocalciferol (VITAMIN D2) 1.25 MG (50000 UT) capsule Take 50,000 Units by mouth once a week.    [provider]  gabapentin (NEURONTIN) 100 MG capsule Take 100 mg by mouth at bedtime.    [provider]  hydroxychloroquine (PLAQUENIL) 200 MG tablet Take 400 mg by mouth daily.    [provider]  ketoconazole (NIZORAL) 2 % cream Apply 1 Application topically 2 (two) times daily.    [provider]  lisinopril (ZESTRIL) 40 MG tablet Take 40 mg by mouth daily.    [provider]  Melatonin 3 MG CAPS Take 3 mg by  mouth at bedtime as needed (sleep).    [provider]  NONFORMULARY OR COMPOUNDED ITEM 0.5-1 mLs by Intracavernosal route daily as needed (ED). Prostaglandin 10mg /mL Papaverine 30mg /mL Phentolamine 1mg /mL    [provider]  pantoprazole (PROTONIX) 40 MG tablet Take 1 tablet (40 mg total) by mouth daily. 12/23/16   Marquette Saa, MD  polyethylene glycol (MIRALAX / GLYCOLAX) 17 g packet Take 17 g by mouth daily as needed for moderate constipation.    [provider]  rivaroxaban (XARELTO) 20 MG TABS tablet Take 20 mg by mouth daily with breakfast.     [provider]  rosuvastatin (CRESTOR) 40 MG tablet Take 40 mg by mouth at bedtime.    [provider]  SEMAGLUTIDE, 2 MG/DOSE, Ingram Inject 2 mg into the skin once a week.    [provider]  torsemide (DEMADEX) 20 MG tablet Take 10 mg by mouth daily.    [provider]  traZODone (DESYREL) 50 MG tablet Take 25 mg by mouth at bedtime.    [provider]    No current facility-administered medications for this encounter.   Current Outpatient Medications  Medication Sig Dispense Refill   acetaminophen (TYLENOL) 325 MG tablet Take 650 mg by mouth every 6 (six) hours as needed for moderate pain.     adapalene (DIFFERIN) 0.1 % gel Apply 1 Application topically daily.     albuterol (VENTOLIN HFA) 108 (90 Base) MCG/ACT inhaler Inhale 1 puff into the lungs every 6 (six) hours as needed for wheezing or shortness of breath.     Ascorbic Acid (VITAMIN C) 1000 MG tablet Take 1,000 mg by mouth daily.     betamethasone dipropionate 0.05 % cream Apply 1 application topically daily as needed (irritation).     carvedilol (COREG) 25 MG tablet Take 12.5 mg by mouth 2 (two) times daily with a meal.     cephALEXin (KEFLEX) 500 MG capsule Take 500 mg by mouth 4 (four) times daily. X7 days     cyclobenzaprine (FLEXERIL) 10 MG tablet Take 10 mg by mouth at bedtime as needed for muscle  spasms.     diclofenac Sodium (VOLTAREN) 1 % GEL Apply 1 application topically daily as needed (pain).     DULoxetine (CYMBALTA) 60 MG capsule Take 120 mg by mouth at bedtime.     ergocalciferol (VITAMIN D2) 1.25 MG (50000 UT) capsule Take 50,000 Units by mouth once a week.     gabapentin (NEURONTIN) 100 MG capsule Take 100 mg by mouth at bedtime.     hydroxychloroquine (PLAQUENIL) 200 MG tablet Take 400 mg by mouth daily.     ketoconazole (NIZORAL) 2 % cream Apply 1 Application topically 2 (two) times daily.     lisinopril (ZESTRIL) 40 MG tablet Take 40 mg by mouth daily.     Melatonin 3 MG CAPS Take 3 mg by mouth at bedtime as needed (sleep).     NONFORMULARY OR COMPOUNDED ITEM 0.5-1 mLs by Intracavernosal route daily as needed (ED). Prostaglandin 10mg /mL Papaverine 30mg /mL Phentolamine 1mg /mL     pantoprazole (PROTONIX) 40 MG tablet Take 1 tablet (40 mg total) by mouth daily. 30 tablet 0   polyethylene glycol (MIRALAX / GLYCOLAX) 17 g packet Take 17 g by mouth daily as needed for moderate constipation.     rivaroxaban (XARELTO) 20 MG TABS tablet Take 20 mg by mouth daily with breakfast.      rosuvastatin (CRESTOR) 40 MG tablet Take 40 mg by mouth at bedtime.     SEMAGLUTIDE, 2 MG/DOSE, Weott Inject 2 mg into the skin once a week.     torsemide (DEMADEX) 20 MG tablet Take 10 mg by mouth daily.     traZODone (DESYREL) 50 MG tablet Take 25 mg by mouth at bedtime.      Allergies as of 03/25/2023   (No Known Allergies)     Review of Systems:    Constitutional: No weight loss, fever or chills Skin: No rash  Cardiovascular: No chest pain  Respiratory: No SOB  Gastrointestinal: See HPI and otherwise negative Genitourinary: No dysuria  Neurological: See HPI  Musculoskeletal: No new muscle or joint pain Hematologic: No bruising Psychiatric: No history of depression or anxiety    Physical Exam:  Vital signs in last 24 hours: Temp:  [97.6 F (36.4 C)-98 F (36.7 C)] 98 F (36.7 C)  (10/25 1410) Pulse Rate:  [70-85] 72 (10/25 1410) Resp:  [16-19] 18 (10/25 1410) BP: (94-113)/(59-75) 113/75 (10/25 1410) SpO2:  [99 %-100 %] 100 % (10/25 1410) Weight:  [113.4 kg] 113.4 kg (10/25 1000)   General:   Pleasant AA male appears to be in NAD, Well developed, Well nourished, alert and cooperative Head:  Normocephalic and atraumatic. Eyes:   PEERL, EOMI. No icterus. Conjunctiva pink. Ears:  Normal auditory acuity. Neck:  Supple Throat: Oral cavity and pharynx without inflammation, swelling or lesion. Teeth in good condition. Lungs: Respirations even and unlabored. Lungs clear to auscultation bilaterally.   No wheezes, crackles, or rhonchi.  Heart: Normal S1, S2. No MRG. Regular rate and rhythm. No peripheral edema, cyanosis or pallor.  Abdomen:  Soft, nondistended, nontender. No rebound or guarding. Normal bowel sounds. No appreciable masses or hepatomegaly. Rectal:  Not performed.  Msk:  Symmetrical without gross deformities. Peripheral pulses intact.  Extremities:  Without edema, no deformity or joint abnormality. Normal ROM, normal sensation. Neurologic:  Alert and  oriented x4;  grossly normal neurologically.  Skin:   Dry and intact without significant lesions or rashes. Psychiatric: Demonstrates good judgement and reason without abnormal affect or behaviors.   LAB RESULTS: Recent Labs    03/25/23 1001  WBC 6.0  HGB 9.7*  HCT 28.7*  PLT 130*   BMET Recent Labs    03/25/23 1001  NA 138  K 3.9  CL 109  CO2 24  GLUCOSE 94  BUN 17  CREATININE 0.87  CALCIUM 8.3*     Impression / Plan:   Impression: 1.  Symptomatic anemia: With dizziness and hypotension and hemoglobin of 9.7 (13.3 a month ago), description of dark stools x 2, the last this morning, EGD in 2018 with nonbleeding gastric ulcer and repeat EGD in 2020 with Dieulafoy lesion treated with clips and APC in [place], now presenting with dark stools; consider Dieulafoy +/- gastric ulcer 2.  A-fib on  Xarelto: last dose this morning 10/25  Plan: 1.  Scheduled patient for a diagnostic EGD tomorrow with Dr. Adela Lank.  Did discuss risks, benefits, limitations and alternatives and the patient agrees to proceed. 2.  Patient can continue on a regular diet today and n.p.o. at midnight.  He is requesting to be able to eat real food today. 3.  Agree with Pantoprazole 40 mg IV twice daily 4.  Hold Xarelto now 5.  Continue to monitor hemoglobin with transfusion as needed less than 7  Thank you for your kind consultation, we will continue to follow.  Violet Baldy [facility]  03/25/2023, 2:33 PM

## 2023-03-25 NOTE — Consult Note (Addendum)
Consultation  Referring Provider:  Dr. Elayne Snare    Primary Care Physician:  [name] Primary Gastroenterologist:  Gentry Fitz       Reason for Consultation: Anemia and "dark stools"             HPI:   Cameron Muska. is a 67 y.o. male with a past medical history as listed below including A-fib on Xarelto (08/20/2019 echo with LVEF 45-50%), prior MI and multiple others, who initially presented to the ED today with dizziness.  Found to be anemic with further description of "dark stools".    At time of presentation patient described being lightheaded with episodes of near syncope a few times over the past week, apparently is measuring blood pressure at home with a systolic noted in the 70s.  Called his doctor's office at the [place] who told him to hold antihypertensive regimen.    Today, patient seen with wife by bedside who assists with history.  He explains that he really did not know what was going on but started feeling dizzy and weak with some hypotension at home.  Tells me though that he did notice a dark formed stool yesterday evening and then again this morning.  This has happened to him before previously in 2018 and 2020 as outlined below, but he tells me the stool this time was not tarry and sticky like it was previously and was still formed so he did not really think much about it.  He does take Xarelto with his last dose this morning.  No abdominal pain and remains on Pantoprazole 40 mg daily.    Apparently he had a brother who died from an upper GI bleed it sounds like.    Denies fever, chills or weight loss.  ER course: Hemoglobin 9.7 (13.3 on 02/07/2023), platelets 130, Hemoccult positive, BUN normal  GI history: 02/08/2023 CT renal stone study showed moderate amount of retained stool suggesting constipation and enlarged prostate gland 12/06/2018 patient admitted the hospital in [place] for blood in stool, apparently had EGD there with evidence of Diuelafoy lesion with  recent bleeding and white nipple like projection with visible vessel status post Hemoclip x 1 12/17/2016 EGD done for melena with nonbleeding gastric ulcer and normal duodenum to the second portion as well as gastritis  Past Medical History:  Diagnosis Date   Acute upper GI bleed    Atrial fibrillation (HCC)    Atrial flutter (HCC)    Chronic anticoagulation    Coronary artery disease    Essential hypertension 08/18/2019   Hyperlipidemia 08/18/2019   Myocardial infarction (HCC)    unknown   Obesity (BMI 30-39.9) 08/18/2019   OSA on CPAP 08/18/2019   Sleep apnea    Tobacco abuse 08/18/2019    Past Surgical History:  Procedure Laterality Date   ATRIAL FIBRILLATION ABLATION N/A 10/23/2019   Procedure: ATRIAL FIBRILLATION ABLATION;  Surgeon: Hillis Range, MD;  [facility];  Service: Cardiovascular;  Laterality: N/A;   BACK SURGERY     CERVICAL FUSION     CORONARY ARTERY BYPASS GRAFT  2013   1   CORONARY ARTERY BYPASS GRAFT     [facility]   ESOPHAGOGASTRODUODENOSCOPY (EGD) WITH PROPOFOL N/A 12/21/2016   Procedure: ESOPHAGOGASTRODUODENOSCOPY (EGD) WITH PROPOFOL;  Surgeon: Kathi Der, MD;  [facility]: [facility];  Service: Gastroenterology;  Laterality: N/A;   TONSILLECTOMY     TOTAL HIP ARTHROPLASTY Left 02/22/2019   Procedure: TOTAL  HIP ARTHROPLASTY ANTERIOR APPROACH;  Surgeon: Samson Frederic, MD;  Location: [facility];  Service: Orthopedics;  Laterality: Left;    Family History  Problem Relation Age of Onset   Heart attack Father        in his 94s    Social History   Tobacco Use   Smoking status: Former    Current packs/day: 0.00    Average packs/day: 0.3 packs/day for 15.0 years (3.8 ttl pk-yrs)    Types: Cigarettes    Start date: 10/05/2006    Quit date: 10/04/2021    Years since quitting: 1.4   Smokeless tobacco: Never   Tobacco comments:    3 cigarettes daily  Vaping Use   Vaping status: Never Used  Substance Use Topics    Alcohol use: No   Drug use: No    Prior to Admission medications   Medication Sig Start Date End Date Taking? Authorizing Provider  acetaminophen (TYLENOL) 325 MG tablet Take 650 mg by mouth every 6 (six) hours as needed for moderate pain. 12/01/18   [provider]  adapalene (DIFFERIN) 0.1 % gel Apply 1 Application topically daily.    [provider]  albuterol (VENTOLIN HFA) 108 (90 Base) MCG/ACT inhaler Inhale 1 puff into the lungs every 6 (six) hours as needed for wheezing or shortness of breath.    [provider]  Ascorbic Acid (VITAMIN C) 1000 MG tablet Take 1,000 mg by mouth daily.    [provider]  betamethasone dipropionate 0.05 % cream Apply 1 application topically daily as needed (irritation).    [provider]  carvedilol (COREG) 25 MG tablet Take 12.5 mg by mouth 2 (two) times daily with a meal.    [provider]  cephALEXin (KEFLEX) 500 MG capsule Take 500 mg by mouth 4 (four) times daily. X7 days 03/16/23   [provider]  cyclobenzaprine (FLEXERIL) 10 MG tablet Take 10 mg by mouth at bedtime as needed for muscle spasms.    [provider]  diclofenac Sodium (VOLTAREN) 1 % GEL Apply 1 application topically daily as needed (pain).    [provider]  DULoxetine (CYMBALTA) 60 MG capsule Take 120 mg by mouth at bedtime.    [provider]  ergocalciferol (VITAMIN D2) 1.25 MG (50000 UT) capsule Take 50,000 Units by mouth once a week.    [provider]  gabapentin (NEURONTIN) 100 MG capsule Take 100 mg by mouth at bedtime.    [provider]  hydroxychloroquine (PLAQUENIL) 200 MG tablet Take 400 mg by mouth daily.    [provider]  ketoconazole (NIZORAL) 2 % cream Apply 1 Application topically 2 (two) times daily.    [provider]  lisinopril (ZESTRIL) 40 MG tablet Take 40 mg by mouth daily.    [provider]  Melatonin 3 MG CAPS Take 3 mg by  mouth at bedtime as needed (sleep).    [provider]  NONFORMULARY OR COMPOUNDED ITEM 0.5-1 mLs by Intracavernosal route daily as needed (ED). Prostaglandin 10mg /mL Papaverine 30mg /mL Phentolamine 1mg /mL    [provider]  pantoprazole (PROTONIX) 40 MG tablet Take 1 tablet (40 mg total) by mouth daily. 12/23/16   Marquette Saa, MD  polyethylene glycol (MIRALAX / GLYCOLAX) 17 g packet Take 17 g by mouth daily as needed for moderate constipation.    [provider]  rivaroxaban (XARELTO) 20 MG TABS tablet Take 20 mg by mouth daily with breakfast.     [provider]  rosuvastatin (CRESTOR) 40 MG tablet Take 40 mg by mouth at bedtime.    [provider]  SEMAGLUTIDE, 2 MG/DOSE, Ingram Inject 2 mg into the skin once a week.    [provider]  torsemide (DEMADEX) 20 MG tablet Take 10 mg by mouth daily.    [provider]  traZODone (DESYREL) 50 MG tablet Take 25 mg by mouth at bedtime.    [provider]    No current facility-administered medications for this encounter.   Current Outpatient Medications  Medication Sig Dispense Refill   acetaminophen (TYLENOL) 325 MG tablet Take 650 mg by mouth every 6 (six) hours as needed for moderate pain.     adapalene (DIFFERIN) 0.1 % gel Apply 1 Application topically daily.     albuterol (VENTOLIN HFA) 108 (90 Base) MCG/ACT inhaler Inhale 1 puff into the lungs every 6 (six) hours as needed for wheezing or shortness of breath.     Ascorbic Acid (VITAMIN C) 1000 MG tablet Take 1,000 mg by mouth daily.     betamethasone dipropionate 0.05 % cream Apply 1 application topically daily as needed (irritation).     carvedilol (COREG) 25 MG tablet Take 12.5 mg by mouth 2 (two) times daily with a meal.     cephALEXin (KEFLEX) 500 MG capsule Take 500 mg by mouth 4 (four) times daily. X7 days     cyclobenzaprine (FLEXERIL) 10 MG tablet Take 10 mg by mouth at bedtime as needed for muscle  spasms.     diclofenac Sodium (VOLTAREN) 1 % GEL Apply 1 application topically daily as needed (pain).     DULoxetine (CYMBALTA) 60 MG capsule Take 120 mg by mouth at bedtime.     ergocalciferol (VITAMIN D2) 1.25 MG (50000 UT) capsule Take 50,000 Units by mouth once a week.     gabapentin (NEURONTIN) 100 MG capsule Take 100 mg by mouth at bedtime.     hydroxychloroquine (PLAQUENIL) 200 MG tablet Take 400 mg by mouth daily.     ketoconazole (NIZORAL) 2 % cream Apply 1 Application topically 2 (two) times daily.     lisinopril (ZESTRIL) 40 MG tablet Take 40 mg by mouth daily.     Melatonin 3 MG CAPS Take 3 mg by mouth at bedtime as needed (sleep).     NONFORMULARY OR COMPOUNDED ITEM 0.5-1 mLs by Intracavernosal route daily as needed (ED). Prostaglandin 10mg /mL Papaverine 30mg /mL Phentolamine 1mg /mL     pantoprazole (PROTONIX) 40 MG tablet Take 1 tablet (40 mg total) by mouth daily. 30 tablet 0   polyethylene glycol (MIRALAX / GLYCOLAX) 17 g packet Take 17 g by mouth daily as needed for moderate constipation.     rivaroxaban (XARELTO) 20 MG TABS tablet Take 20 mg by mouth daily with breakfast.      rosuvastatin (CRESTOR) 40 MG tablet Take 40 mg by mouth at bedtime.     SEMAGLUTIDE, 2 MG/DOSE, Weott Inject 2 mg into the skin once a week.     torsemide (DEMADEX) 20 MG tablet Take 10 mg by mouth daily.     traZODone (DESYREL) 50 MG tablet Take 25 mg by mouth at bedtime.      Allergies as of 03/25/2023   (No Known Allergies)     Review of Systems:    Constitutional: No weight loss, fever or chills Skin: No rash  Cardiovascular: No chest pain  Respiratory: No SOB  Gastrointestinal: See HPI and otherwise negative Genitourinary: No dysuria  Neurological: See HPI  Musculoskeletal: No new muscle or joint pain Hematologic: No bruising Psychiatric: No history of depression or anxiety    Physical Exam:  Vital signs in last 24 hours: Temp:  [97.6 F (36.4 C)-98 F (36.7 C)] 98 F (36.7 C)  (10/25 1410) Pulse Rate:  [70-85] 72 (10/25 1410) Resp:  [16-19] 18 (10/25 1410) BP: (94-113)/(59-75) 113/75 (10/25 1410) SpO2:  [99 %-100 %] 100 % (10/25 1410) Weight:  [113.4 kg] 113.4 kg (10/25 1000)   General:   Pleasant AA male appears to be in NAD, Well developed, Well nourished, alert and cooperative Head:  Normocephalic and atraumatic. Eyes:   PEERL, EOMI. No icterus. Conjunctiva pink. Ears:  Normal auditory acuity. Neck:  Supple Throat: Oral cavity and pharynx without inflammation, swelling or lesion. Teeth in good condition. Lungs: Respirations even and unlabored. Lungs clear to auscultation bilaterally.   No wheezes, crackles, or rhonchi.  Heart: Normal S1, S2. No MRG. Regular rate and rhythm. No peripheral edema, cyanosis or pallor.  Abdomen:  Soft, nondistended, nontender. No rebound or guarding. Normal bowel sounds. No appreciable masses or hepatomegaly. Rectal:  Not performed.  Msk:  Symmetrical without gross deformities. Peripheral pulses intact.  Extremities:  Without edema, no deformity or joint abnormality. Normal ROM, normal sensation. Neurologic:  Alert and  oriented x4;  grossly normal neurologically.  Skin:   Dry and intact without significant lesions or rashes. Psychiatric: Demonstrates good judgement and reason without abnormal affect or behaviors.   LAB RESULTS: Recent Labs    03/25/23 1001  WBC 6.0  HGB 9.7*  HCT 28.7*  PLT 130*   BMET Recent Labs    03/25/23 1001  NA 138  K 3.9  CL 109  CO2 24  GLUCOSE 94  BUN 17  CREATININE 0.87  CALCIUM 8.3*     Impression / Plan:   Impression: 1.  Symptomatic anemia: With dizziness and hypotension and hemoglobin of 9.7 (13.3 a month ago), description of dark stools x 2, the last this morning, EGD in 2018 with nonbleeding gastric ulcer and repeat EGD in 2020 with Dieulafoy lesion treated with clips and APC in [place], now presenting with dark stools; consider Dieulafoy +/- gastric ulcer 2.  A-fib on  Xarelto: last dose this morning 10/25  Plan: 1.  Scheduled patient for a diagnostic EGD tomorrow with Dr. Adela Lank.  Did discuss risks, benefits, limitations and alternatives and the patient agrees to proceed. 2.  Patient can continue on a regular diet today and n.p.o. at midnight.  He is requesting to be able to eat real food today. 3.  Agree with Pantoprazole 40 mg IV twice daily 4.  Hold Xarelto now 5.  Continue to monitor hemoglobin with transfusion as needed less than 7  Thank you for your kind consultation, we will continue to follow.  Violet Baldy [facility]  03/25/2023, 2:33 PM

## 2023-03-25 NOTE — ED Triage Notes (Addendum)
Pt to ED via pov from home. Pt c/o low blood pressure and dizziness for past week. Pt states he feels weak and almost passed out yesterday. Pt states BP was 77/53 this am. [facility] and was told not to take lisinopril, he did take his carvedilol this am.

## 2023-03-26 ENCOUNTER — Inpatient Hospital Stay (HOSPITAL_COMMUNITY): Payer: No Typology Code available for payment source | Admitting: Anesthesiology

## 2023-03-26 ENCOUNTER — Encounter (HOSPITAL_COMMUNITY): Payer: Self-pay | Admitting: Family Medicine

## 2023-03-26 ENCOUNTER — Encounter (HOSPITAL_COMMUNITY)
Admission: EM | Disposition: A | Discharge status: Home / Self Care | Payer: Self-pay | Source: Home / Self Care | Attending: Family Medicine

## 2023-03-26 DIAGNOSIS — K3182 Dieulafoy lesion (hemorrhagic) of stomach and duodenum: Secondary | ICD-10-CM

## 2023-03-26 DIAGNOSIS — I959 Hypotension, unspecified: Secondary | ICD-10-CM | POA: Diagnosis not present

## 2023-03-26 DIAGNOSIS — K31811 Angiodysplasia of stomach and duodenum with bleeding: Secondary | ICD-10-CM | POA: Diagnosis not present

## 2023-03-26 DIAGNOSIS — I2510 Atherosclerotic heart disease of native coronary artery without angina pectoris: Secondary | ICD-10-CM | POA: Diagnosis not present

## 2023-03-26 DIAGNOSIS — K922 Gastrointestinal hemorrhage, unspecified: Secondary | ICD-10-CM | POA: Diagnosis not present

## 2023-03-26 HISTORY — PX: HOT HEMOSTASIS: SHX5433

## 2023-03-26 HISTORY — PX: ESOPHAGOGASTRODUODENOSCOPY (EGD) WITH PROPOFOL: SHX5813

## 2023-03-26 HISTORY — PX: HEMOSTASIS CLIP PLACEMENT: SHX6857

## 2023-03-26 LAB — CBC
HCT: 25.5 % — ABNORMAL LOW (ref 39.0–52.0)
HCT: 26.5 % — ABNORMAL LOW (ref 39.0–52.0)
Hemoglobin: 8.7 g/dL — ABNORMAL LOW (ref 13.0–17.0)
Hemoglobin: 8.9 g/dL — ABNORMAL LOW (ref 13.0–17.0)
MCH: 30.4 pg (ref 26.0–34.0)
MCH: 30.5 pg (ref 26.0–34.0)
MCHC: 34.1 g/dL (ref 30.0–36.0)
MCHC: 33.6 g/dL (ref 30.0–36.0)
MCV: 89.2 fL (ref 80.0–100.0)
MCV: 90.8 fL (ref 80.0–100.0)
Platelets: 107 10*3/uL — ABNORMAL LOW (ref 150–400)
Platelets: 117 10*3/uL — ABNORMAL LOW (ref 150–400)
RBC: 2.86 MIL/uL — ABNORMAL LOW (ref 4.22–5.81)
RBC: 2.92 MIL/uL — ABNORMAL LOW (ref 4.22–5.81)
RDW: 14.9 % (ref 11.5–15.5)
RDW: 15 % (ref 11.5–15.5)
WBC: 5.2 10*3/uL (ref 4.0–10.5)
WBC: 5.1 10*3/uL (ref 4.0–10.5)
nRBC: 0 % (ref 0.0–0.2)
nRBC: 0 % (ref 0.0–0.2)

## 2023-03-26 LAB — COMPREHENSIVE METABOLIC PANEL
ALT: 25 U/L (ref 0–44)
AST: 22 U/L (ref 15–41)
Albumin: 2.8 g/dL — ABNORMAL LOW (ref 3.5–5.0)
Alkaline Phosphatase: 46 U/L (ref 38–126)
Anion gap: 9 (ref 5–15)
BUN: 12 mg/dL (ref 8–23)
CO2: 22 mmol/L (ref 22–32)
Calcium: 8.6 mg/dL — ABNORMAL LOW (ref 8.9–10.3)
Chloride: 109 mmol/L (ref 98–111)
Creatinine, Ser: 0.8 mg/dL (ref 0.61–1.24)
GFR, Estimated: >60 mL/min (ref >60)
Glucose, Bld: 95 mg/dL (ref 70–99)
Potassium: 3.6 mmol/L (ref 3.5–5.1)
Sodium: 140 mmol/L (ref 135–145)
Total Bilirubin: 0.7 mg/dL (ref 0.3–1.2)
Total Protein: 5.6 g/dL — ABNORMAL LOW (ref 6.5–8.1)

## 2023-03-26 LAB — TYPE AND SCREEN
ABO/RH(D): O POS
Antibody Screen: NEGATIVE

## 2023-03-26 SURGERY — ESOPHAGOGASTRODUODENOSCOPY (EGD) WITH PROPOFOL
Anesthesia: Monitor Anesthesia Care

## 2023-03-26 MED ORDER — ACETAMINOPHEN 325 MG PO TABS
650.0000 mg | ORAL_TABLET | Freq: Four times a day (QID) | ORAL | Status: DC | PRN
  Administered 2023-03-26 (×2): 650 mg via ORAL
  Filled 2023-03-26 (×2): qty 2

## 2023-03-26 MED ORDER — PHENYLEPHRINE 80 MCG/ML (10ML) SYRINGE FOR IV PUSH (FOR BLOOD PRESSURE SUPPORT)
PREFILLED_SYRINGE | INTRAVENOUS | Status: DC | PRN
  Administered 2023-03-26: 240 ug via INTRAVENOUS
  Administered 2023-03-26: 160 ug via INTRAVENOUS
  Administered 2023-03-26: 80 ug via INTRAVENOUS
  Administered 2023-03-26: 160 ug via INTRAVENOUS
  Administered 2023-03-26: 240 ug via INTRAVENOUS
  Administered 2023-03-26: 160 ug via INTRAVENOUS

## 2023-03-26 MED ORDER — LACTATED RINGERS IV SOLN: INTRAVENOUS | Status: DC | PRN

## 2023-03-26 MED ORDER — PROPOFOL 10 MG/ML IV BOLUS
INTRAVENOUS | Status: DC | PRN
  Administered 2023-03-26: 50 mg via INTRAVENOUS

## 2023-03-26 MED ORDER — PROPOFOL 500 MG/50ML IV EMUL
INTRAVENOUS | Status: DC | PRN
  Administered 2023-03-26: 135 ug/kg/min via INTRAVENOUS

## 2023-03-26 MED ORDER — EPHEDRINE SULFATE (PRESSORS) 50 MG/ML IJ SOLN
INTRAMUSCULAR | Status: DC | PRN
  Administered 2023-03-26: 5 mg via INTRAVENOUS

## 2023-03-26 MED ORDER — LIDOCAINE 2% (20 MG/ML) 5 ML SYRINGE
INTRAMUSCULAR | Status: DC | PRN
  Administered 2023-03-26: 100 mg via INTRAVENOUS

## 2023-03-26 SURGICAL SUPPLY — 15 items

## 2023-03-26 NOTE — Progress Notes (Signed)
Received a order via secure chat from Dr, Evette Georges to discontinue the cardiac monitoring for now.

## 2023-03-26 NOTE — Assessment & Plan Note (Signed)
Patient's hemoglobin this morning was 8.0, however delaying transfusion until after her EGD today. -Plan for transfusion early this afternoon -Posttransfusion H&H -AM CBC

## 2023-03-26 NOTE — Anesthesia Postprocedure Evaluation (Signed)
Anesthesia Post Note  Patient: Cameron Schultz.  Procedure(s) Performed: ESOPHAGOGASTRODUODENOSCOPY (EGD) WITH PROPOFOL HOT HEMOSTASIS (ARGON PLASMA COAGULATION/BICAP) HEMOSTASIS CLIP PLACEMENT     Patient location during evaluation: PACU Anesthesia Type: MAC Level of consciousness: awake and alert Pain management: pain level controlled Vital Signs Assessment: post-procedure vital signs reviewed and stable Respiratory status: spontaneous breathing Cardiovascular status: stable Anesthetic complications: no   No notable events documented.  Last Vitals:  Vitals:   03/26/23 1030 03/26/23 1045  BP: 92/64 100/73  Pulse: 77 77  Resp: 15 18  Temp:  (!) 36.4 C  SpO2: 100% 99%    Last Pain:  Vitals:   03/26/23 1045  TempSrc:   PainSc: 0-No pain                 Lewie Loron

## 2023-03-26 NOTE — Plan of Care (Signed)

## 2023-03-26 NOTE — Transfer of Care (Signed)
Immediate Anesthesia Transfer of Care Note  Patient: Cameron Schultz.  Procedure(s) Performed: ESOPHAGOGASTRODUODENOSCOPY (EGD) WITH PROPOFOL HOT HEMOSTASIS (ARGON PLASMA COAGULATION/BICAP) HEMOSTASIS CLIP PLACEMENT  Patient Location: PACU  Anesthesia Type:MAC  Level of Consciousness: awake  Airway & Oxygen Therapy: Patient Spontanous Breathing and Patient connected to nasal cannula oxygen  Post-op Assessment: Report given to RN and Post -op Vital signs reviewed and stable  Post vital signs: Reviewed and stable  Last Vitals:  Vitals Value Taken Time  BP 122/78 03/26/23 1021  Temp 36.8 C 03/26/23 1015  Pulse 75 03/26/23 1023  Resp 18 03/26/23 1023  SpO2 97 % 03/26/23 1023  Vitals shown include unfiled device data.  Last Pain:  Vitals:   03/26/23 0905  TempSrc: Temporal  PainSc: 0-No pain         Complications: No notable events documented.

## 2023-03-26 NOTE — Assessment & Plan Note (Addendum)
Patient is followed by GI inpatient, will undergo EGD this morning.  Bleed may or may not have stopped spontaneously based on patient's GB this morning, we will continue to monitor and follow GIs recommendations. -Protonix IV 40 mg twice daily -Continue to hold Xarelto -Follow GI recs following procedure -AM BMP -Add diet after procedure

## 2023-03-26 NOTE — Op Note (Signed)
[facility] Patient Name: Cameron Schultz Procedure Date : 03/26/2023 MRN: [id number] Attending MD: Willaim Rayas. Adela Lank , MD, [id number] Date of Birth: 1955-06-29 CSN: [id number] Age: 67 Admit Type: Inpatient Procedure:                Upper GI endoscopy Indications:              Melena, anemia - on Xarelto Providers:                Willaim Rayas. Adela Lank, MD, Doristine Mango, RN,                            Jacquelyn "Jaci" Clelia Croft, RN, Kandice Robinsons,                            Technician, Geoffery Lyons, Technician Referring MD:              Medicines:                Monitored Anesthesia Care Complications:            No immediate complications. Estimated blood loss:                            Minimal. Estimated Blood Loss:     Estimated blood loss was minimal. Procedure:                Pre-Anesthesia Assessment:                           - Prior to the procedure, a History and Physical                            was performed, and patient medications and                            allergies were reviewed. The patient's tolerance of                            previous anesthesia was also reviewed. The risks                            and benefits of the procedure and the sedation                            options and risks were discussed with the patient.                            All questions were answered, and informed consent                            was obtained. Prior Anticoagulants: The patient has                            taken Xarelto (rivaroxaban), last dose was 1 day  prior to procedure. ASA Grade Assessment: III - A                            patient with severe systemic disease. After                            reviewing the risks and benefits, the patient was                            deemed in satisfactory condition to undergo the                            procedure.                           After obtaining informed  consent, the endoscope was                            passed under direct vision. Throughout the                            procedure, the patient's blood pressure, pulse, and                            oxygen saturations were monitored continuously. The                            [id number]-H190 ([id number]) Olympus endoscope was introduced                            through the mouth, and advanced to the fourth part                            of duodenum. The upper GI endoscopy was                            accomplished without difficulty. The patient                            tolerated the procedure well. Scope In: Scope Out: Findings:      Esophagogastric landmarks were identified: the Z-line was found at 44       cm, the gastroesophageal junction was found at 44 cm and the upper       extent of the gastric folds was found at 44 cm from the incisors.      The exam of the esophagus was otherwise normal.      The entire examined stomach was normal.      Red blood and clot was found in the second portion of the duodenum, in       the third portion of the duodenum and in the fourth portion of the       duodenum. This was lavaged for a few minutes until the source could be       localized.      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 Fulguration to       stop the bleeding by argon plasma was successful. To prevent bleeding       post-intervention, two hemostatic clips were then successfully placed.       The small bowel was quite spastic and took several minutes to apply the       clips. No bleeding was noted at the end of the procedure.      The exam of the duodenum was otherwise normal. Impression:               - Esophagogastric landmarks identified.                           - Normal esophagus otherwise.                           - Normal stomach.                           - Blood in the second  portion of the duodenum, in                            the third portion of the duodenum and in the fourth                            portion of the duodenum.                           - A single bleeding diuelafoy lesion was identified                            in the distal duodenum. Treated with argon plasma                            coagulation (APC). Clips were placed x 2. Recommendation:           - Return patient to hospital ward for ongoing care.                           - Full liquid diet today, advance tomorrow as                            tolerated                           - Continue present medications.                           - Hold Xarelto for today while monitoring for                            rebleeding. Repeat Hgb tomorrow.                           - If patient does well today can be discharged  tomorrow. Resume Xarelto in 2-3 days if no further                            bleeding.                           - We will sign off for now, please call with                            questions or any rebleeding that may occure Procedure Code(s):        --- Professional ---                           [phone], Esophagogastroduodenoscopy, flexible,                            transoral; with control of bleeding, any method Diagnosis Code(s):        --- Professional ---                           K92.2, Gastrointestinal hemorrhage, unspecified                           K31.811, Angiodysplasia of stomach and duodenum                            with bleeding                           K92.1, Melena (includes Hematochezia) CPT copyright 2022 American Medical Association. All rights reserved. The codes documented in this report are preliminary and upon coder review may  be revised to meet current compliance requirements. Viviann Spare P. Quandra Fedorchak, MD 03/26/2023 10:21:20 AM This report has been signed electronically. Number of Addenda: 0

## 2023-03-26 NOTE — Interval H&P Note (Signed)
History and Physical Interval Note: Patient here for EGD to further evaluate melena, symptomatic anemia. No further bleeding reported overnight. Hgb has drifted to 8s. He otherwise feels okay this AM. Last dose of Xarelto yesterday AM. I have discussed risks / benefits of EGD and anesthesia with him and he wishes to proceed. Further recommendations pending the results.   03/26/2023 9:21 AM  Cameron Demetrius Charity.  has presented today for surgery, with the diagnosis of Melena, Anemia.  The various methods of treatment have been discussed with the patient and family. After consideration of risks, benefits and other options for treatment, the patient has consented to  Procedure(s): ESOPHAGOGASTRODUODENOSCOPY (EGD) WITH PROPOFOL (N/A) as a surgical intervention.  The patient's history has been reviewed, patient examined, no change in status, stable for surgery.  I have reviewed the patient's chart and labs.  Questions were answered to the patient's satisfaction.     Viviann Spare P Randye Treichler

## 2023-03-26 NOTE — Progress Notes (Signed)
Daily Progress Note Intern Pager: [phone]  Patient name: Cameron Schultz. Medical record number: [id number] Date of birth: 02-09-1956 Age: 67 y.o. Gender: male  Primary Care Provider: [facility] Consultants: GI Code Status: Full  Pt Overview and Major Events to Date:  10/25: Admitted to FM TS  Assessment and Plan:  Cameron Schultz is a pleasant 67 year old male presenting with dizziness and dark-colored stools, with suspected GI bleed.  He will undergo EGD today with GI service.  Patient has been n.p.o. since midnight in preparation. Assessment & Plan GI bleed Patient is followed by GI inpatient, will undergo EGD this morning.  Bleed may or may not have stopped spontaneously based on patient's GB this morning, we will continue to monitor and follow GIs recommendations. -Protonix IV 40 mg twice daily -Continue to hold Xarelto -Follow GI recs following procedure -AM BMP -Add diet after procedure Anemia, posthemorrhagic, acute Patient's hemoglobin this morning was 8.0, however delaying transfusion until after her EGD today. -Plan for transfusion early this afternoon -Posttransfusion H&H -AM CBC   Chronic and Stable Problems:  Lupus: Hydroxychloroquine 400 mg daily HLD: Rosuvastatin 40 mg daily Tobacco use: Nicotine patch Back pain gabapentin 100 mg daily, hold Flexeril 10 mg daily Shortness of breath: Combivent as needed Obesity/prediabetes: A1c in 2023 5.7, hold semaglutide 2 mg weekly Edema: Hold torsemide 10 mg as needed OSA: Nightly CPAP Constipation MiraLAX daily as needed   FEN/GI: NPO until after procedure, then regular diet PPx: SCDs, no pharmacologic prophylaxis given high risk for bleeding Dispo:Home pending clinical improvement .   Subjective:  Patient resting comfortably in bed on exam this morning.  He reports being up all night going to the restroom multiple times after receiving fluids yesterday but is otherwise in good spirits this  morning.  Objective: Temp:  [97.6 F (36.4 C)-98.7 F (37.1 C)] 98.7 F (37.1 C) (10/26 0717) Pulse Rate:  [69-85] 85 (10/26 0717) Resp:  [13-19] 16 (10/26 0717) BP: (94-145)/(59-88) 145/88 (10/26 0717) SpO2:  [99 %-100 %] 100 % (10/26 0717) Weight:  [113.4 kg] 113.4 kg (10/25 1000) Physical Exam: General: Alert, oriented.  No distress Cardiovascular: RRR, no M/R/G. Respiratory: CTAB, no increased work of breathing Abdomen: Flat, soft, nontender Extremities: 2+ peripheral pulses x 4  Laboratory: Most recent CBC Lab Results  Component Value Date   WBC 5.2 03/26/2023   HGB 8.7 (L) 03/26/2023   HCT 25.5 (L) 03/26/2023   MCV 89.2 03/26/2023   PLT 107 (L) 03/26/2023   Most recent BMP    Latest Ref Rng & Units 03/26/2023    4:33 AM  BMP  Glucose 70 - 99 mg/dL 95   BUN 8 - 23 mg/dL 12   Creatinine 3.66 - 1.24 mg/dL 4.40   Sodium 347 - 425 mmol/L 140   Potassium 3.5 - 5.1 mmol/L 3.6   Chloride 98 - 111 mmol/L 109   CO2 22 - 32 mmol/L 22   Calcium 8.9 - 10.3 mg/dL 8.6     Cameron Heck, DO 03/26/2023, 7:19 AM  PGY-1, [facility] FPTS Intern pager: [phone], text pages welcome Secure chat group [facility] Teaching Service

## 2023-03-26 NOTE — Progress Notes (Signed)
Per pt passed large BM. Stool was dark with no bright red blood present. Notified MD.

## 2023-03-27 DIAGNOSIS — I959 Hypotension, unspecified: Secondary | ICD-10-CM | POA: Diagnosis not present

## 2023-03-27 DIAGNOSIS — K922 Gastrointestinal hemorrhage, unspecified: Secondary | ICD-10-CM | POA: Diagnosis not present

## 2023-03-27 LAB — HEMOGLOBIN: Hemoglobin: 8.8 g/dL — ABNORMAL LOW (ref 13.0–17.0)

## 2023-03-27 LAB — CBC
HCT: 29.2 % — ABNORMAL LOW (ref 39.0–52.0)
Hemoglobin: 10 g/dL — ABNORMAL LOW (ref 13.0–17.0)
MCH: 31.2 pg (ref 26.0–34.0)
MCHC: 34.2 g/dL (ref 30.0–36.0)
MCV: 91 fL (ref 80.0–100.0)
Platelets: 129 10*3/uL — ABNORMAL LOW (ref 150–400)
RBC: 3.21 MIL/uL — ABNORMAL LOW (ref 4.22–5.81)
RDW: 15.2 % (ref 11.5–15.5)
WBC: 4.7 10*3/uL (ref 4.0–10.5)
nRBC: 0 % (ref 0.0–0.2)

## 2023-03-27 MED ORDER — PANTOPRAZOLE SODIUM 40 MG PO TBEC
40.0000 mg | DELAYED_RELEASE_TABLET | Freq: Two times a day (BID) | ORAL | Status: DC
  Administered 2023-03-27 – 2023-03-28 (×2): 40 mg via ORAL
  Filled 2023-03-27 (×2): qty 1

## 2023-03-27 MED ORDER — CARVEDILOL 12.5 MG PO TABS
12.5000 mg | ORAL_TABLET | Freq: Two times a day (BID) | ORAL | Status: DC
  Administered 2023-03-27 – 2023-03-28 (×2): 12.5 mg via ORAL
  Filled 2023-03-27 (×2): qty 1

## 2023-03-27 NOTE — Progress Notes (Addendum)
Daily Progress Note Intern Pager: [phone]  Patient name: Cameron Schultz. Medical record number: [id number] Date of birth: 1955/09/18 Age: 67 y.o. Gender: male  Primary Care Provider: [facility] Consultants: GI (signed off 10/26) Code Status: Full  Pt Overview and Major Events to Date:  10/25: Admitted to FMTS 10/26: EGD   Assessment and Plan: Cameron Schultz is a 67 year old male who presented with dizziness and dark-colored stools, with suspected GI bleed.  EGD with confirmation for actively bleeding dieulafoy lesion in the duodenum, cauterized during scope. Will hope to advance diet today as tolerated.  Assessment & Plan GI bleed EGD yesterday revealed actively bleeding dieulafoy lesion in the duodenum.  This was cauterized.  No further evidence of bleeding this morning. - appreciate GI recs -Transition Protonix to p.o. 40 mg twice daily -Continue to hold Xarelto, may resume tomorrow if stable hemoglobin and no signs of bleeding -Recheck orthostatic vital signs today -AM BMP -Advance diet as tolerated Anemia, posthemorrhagic, acute Patient's hemoglobin this morning 10.0, improved from 8.9 yesterday. -AM CBC - transfuse if <7 Dieulafoy lesion of duodenum Management as above.  Appreciate GI recommendations.   Chronic and Stable Problems:  Lupus: Hydroxychloroquine 400 mg daily HLD: Rosuvastatin 40 mg daily Tobacco use: Nicotine patch Back pain: Gabapentin 100 mg daily, hold Flexeril 10 mg daily Shortness of breath: Combivent as needed Obesity/prediabetes: A1c in 2023 5.7, hold semaglutide 2 mg weekly Edema: Hold torsemide 10 mg as needed OSA: Nightly CPAP Constipation: MiraLAX daily as needed     FEN/GI: Full liquid diet now, progress as able today PPx: SCDs, no chemical prophylaxis due to bleeding Dispo:Home pending clinical improvement .  Anticipate discharge tomorrow 10/28 if patient continues to do well.  Subjective:  Patient seen this  morning sitting up in bed.  He states he is doing well and is without complaint.  Denies headache, dizziness, shortness of breath, abdominal pain.  He reports a large bowel movement yesterday.  Passing flatus.  He states he is hungry and would like to progress his diet.  Objective: Temp:  [97.7 F (36.5 C)-98.4 F (36.9 C)] 97.7 F (36.5 C) (10/27 0929) Pulse Rate:  [70-72] 70 (10/27 0929) Resp:  [16-18] 18 (10/27 0929) BP: (104-125)/(69-84) 125/84 (10/27 0929) SpO2:  [100 %] 100 % (10/27 0929) Physical Exam: General: Well-appearing, pleasant, NAD. Cardiovascular: RRR, no murmurs Respiratory: CTA bilaterally, no increased work of breathing on room air. Abdomen: Normoactive bowel sounds, soft, nontender to palpation Extremities: Moves all extremities equally.  Laboratory: Most recent CBC Lab Results  Component Value Date   WBC 4.7 03/27/2023   HGB 10.0 (L) 03/27/2023   HCT 29.2 (L) 03/27/2023   MCV 91.0 03/27/2023   PLT 129 (L) 03/27/2023   Most recent BMP    Latest Ref Rng & Units 03/26/2023    4:33 AM  BMP  Glucose 70 - 99 mg/dL 95   BUN 8 - 23 mg/dL 12   Creatinine 1.47 - 1.24 mg/dL 8.29   Sodium 562 - 130 mmol/L 140   Potassium 3.5 - 5.1 mmol/L 3.6   Chloride 98 - 111 mmol/L 109   CO2 22 - 32 mmol/L 22   Calcium 8.9 - 10.3 mg/dL 8.6     Cameron Skeeters, DO 03/27/2023, 12:30 PM  PGY-1, [facility] FPTS Intern pager: [phone], text pages welcome Secure chat group [facility] Teaching Service

## 2023-03-27 NOTE — Assessment & Plan Note (Addendum)
Patient's hemoglobin this morning 10.0, improved from 8.9 yesterday. -AM CBC - transfuse if <7

## 2023-03-27 NOTE — Plan of Care (Signed)

## 2023-03-27 NOTE — Assessment & Plan Note (Signed)
Management as above.  Appreciate GI recommendations.

## 2023-03-27 NOTE — Plan of Care (Signed)
  Problem: Education: Goal: Knowledge of General Education information will improve Description: Including pain rating scale, medication(s)/side effects and non-pharmacologic comfort measures Outcome: Progressing   Problem: Health Behavior/Discharge Planning: Goal: Ability to manage health-related needs will improve Outcome: Progressing   Problem: Clinical Measurements: Goal: Ability to maintain clinical measurements within normal limits will improve Outcome: Progressing Goal: Will remain free from infection Outcome: Progressing Goal: Diagnostic test results will improve Outcome: Progressing Goal: Respiratory complications will improve Outcome: Progressing Goal: Cardiovascular complication will be avoided Outcome: Progressing   Problem: Activity: Goal: Risk for activity intolerance will decrease Outcome: Progressing   Problem: Nutrition: Goal: Adequate nutrition will be maintained Outcome: Progressing   Problem: Coping: Goal: Level of anxiety will decrease Outcome: Progressing   Problem: Pain Management: Goal: General experience of comfort will improve Outcome: Progressing   Problem: Elimination: Goal: Will not experience complications related to bowel motility Outcome: Progressing   Problem: Skin Integrity: Goal: Risk for impaired skin integrity will decrease Outcome: Progressing

## 2023-03-27 NOTE — Progress Notes (Signed)
   03/27/23 2309  BiPAP/CPAP/SIPAP  $ Non-Invasive Ventilator  Non-Invasive Vent Subsequent  $ Face Mask Large  Yes  BiPAP/CPAP/SIPAP Pt Type Adult  BiPAP/CPAP/SIPAP DREAMSTATIOND  Mask Type Full face mask  Mask Size Large  Respiratory Rate 18 breaths/min  EPAP 4 cmH2O  FiO2 (%) 21 %  Flow Rate 0 lpm  Patient Home Equipment No  Auto Titrate No  CPAP/SIPAP surface wiped down Yes  BiPAP/CPAP /SiPAP Vitals  Pulse Rate 87  Resp 18  SpO2 100 %  Bilateral Breath Sounds Clear  MEWS Score/Color  MEWS Score 0  MEWS Score Color Cameron Schultz

## 2023-03-27 NOTE — Assessment & Plan Note (Addendum)
EGD yesterday revealed actively bleeding dieulafoy lesion in the duodenum.  This was cauterized.  No further evidence of bleeding this morning. - appreciate GI recs -Transition Protonix to p.o. 40 mg twice daily -Continue to hold Xarelto, may resume tomorrow if stable hemoglobin and no signs of bleeding -Recheck orthostatic vital signs today -AM BMP -Advance diet as tolerated

## 2023-03-27 NOTE — Progress Notes (Signed)
Per pt, "I took a pill I found from last night and I think it is melatonin." I advised pt that he should never take any medication that he doesn't know what it is. Pt verbally agreed he understands.

## 2023-03-28 DIAGNOSIS — I959 Hypotension, unspecified: Secondary | ICD-10-CM | POA: Diagnosis not present

## 2023-03-28 DIAGNOSIS — K922 Gastrointestinal hemorrhage, unspecified: Secondary | ICD-10-CM | POA: Diagnosis not present

## 2023-03-28 LAB — CBC
HCT: 27.1 % — ABNORMAL LOW (ref 39.0–52.0)
Hemoglobin: 9.2 g/dL — ABNORMAL LOW (ref 13.0–17.0)
MCH: 30.9 pg (ref 26.0–34.0)
MCHC: 33.9 g/dL (ref 30.0–36.0)
MCV: 90.9 fL (ref 80.0–100.0)
Platelets: 138 10*3/uL — ABNORMAL LOW (ref 150–400)
RBC: 2.98 MIL/uL — ABNORMAL LOW (ref 4.22–5.81)
RDW: 15.4 % (ref 11.5–15.5)
WBC: 4.9 10*3/uL (ref 4.0–10.5)
nRBC: 0 % (ref 0.0–0.2)

## 2023-03-28 MED ORDER — PANTOPRAZOLE SODIUM 40 MG PO TBEC: DELAYED_RELEASE_TABLET | ORAL | 0 refills | Status: DC

## 2023-03-28 MED ORDER — NICOTINE 7 MG/24HR TD PT24: 7.0000 mg | MEDICATED_PATCH | Freq: Every day | TRANSDERMAL | Status: DC

## 2023-03-28 MED ORDER — RIVAROXABAN 20 MG PO TABS: 20.0000 mg | ORAL_TABLET | Freq: Every day | ORAL | Status: DC

## 2023-03-28 NOTE — Assessment & Plan Note (Deleted)
Patient's hemoglobin this morning 9.2, stable. -AM CBC - transfuse if <7

## 2023-03-28 NOTE — Discharge Instructions (Signed)
Dear Cameron Schultz.,  Thank you for letting us participate in your care. You were hospitalized for hypotension/weakness with bleeding yeah and diagnosed with gastrointestinal bleeding. You were treated with cauterization of the bleeding lesion in the duodenum with improvement in your blood counts.   POST-HOSPITAL & CARE INSTRUCTIONS Be sure to take all of your medications as listed in this packet. Go to your follow up appointments (listed below)  DOCTOR'S APPOINTMENT   No future appointments.   Take care and be well!  Family Medicine Teaching Service Inpatient Team [place]  [facility]  [address] [place], [place] [address] [phone]

## 2023-03-28 NOTE — Discharge Summary (Addendum)
Family Medicine Teaching Service [facility] Discharge Summary  Patient name: Cameron Schultz. Medical record number: [id number] Date of birth: 19-Jan-1956 Age: 67 y.o. Gender: male Date of Admission: 03/25/2023  Date of Discharge: 03/28/2023 Admitting Physician: Lorayne Bender, MD  Primary Care Provider: [facility], [name] Consultants: GI  Indication for Hospitalization: Hematochezia and symptomatic anemia  Discharge Diagnoses/Problem List:  Principal Problem for Admission: GI Bleed Other Problems addressed during stay:  Active Problems:   Anticoagulated   Essential hypertension   Hyperlipidemia   OSA on CPAP   Obesity (BMI 30-39.9)   CAD (coronary artery disease)   Atrial flutter (HCC)   Persistent atrial fibrillation (HCC)   Hx of CABG   Polysubstance dependence (HCC)   Chronic low back pain   Benign prostatic hyperplasia without lower urinary tract symptoms   Morbid obesity (HCC)   GI bleed   Melena   Anemia, posthemorrhagic, acute   Dieulafoy lesion of duodenum  Brief Hospital Course:  Cameron Hopman. is a 67 y.o.male with a history of A-fib/a flutter status post ablation 2021, CAD status post CABG 2013, HFmrEF, history of GI bleed 2018 and 2020 with previous ulcer and Dieulafoy lesion on EGD, obesity, OSA, hyperlipidemia who was admitted to the family medicine teaching Service at [facility] for lightheadedness and dark stools. His hospital course is detailed below:  Concern for GI bleed ABLA Patient presented to the ED with dark stools, history of GI bleed and currently on Xarelto.  FOBT positive.  Anemia to 9.7.  Received fluids while in the ED and was admitted to the hospitalist service for further evaluation and treatment.  Patient was started on twice daily IV PPI and GI was consulted.  GI continued with scope that showed bleeding dieulafoy lesion in the duodenum, which was cauterized and clipped appropriately. They also recommended that Xarelto be held for 2-3  days following the procedure, with re initiation to take place on 10/29.  By discharge, hemoglobin stable at 9.2.  Hypotension  Orthostasis  In setting of acute blood loss anemia, resolved with fluid bolus.  Other chronic conditions were medically managed with home medications and formulary alternatives as necessary (Lupus, HLD, Tobacco use, back pain, SOB, obesity/prediabetes, edema, OSA, constipation)  PCP Follow-up Recommendations: Ensure stable BP Recommend recheck CBC to ensure stable hemoglobin Restart Xarelto on 10/29  Disposition: Home  Discharge Condition: Stable  Discharge Exam:  Vitals:   03/28/23 0449 03/28/23 0837  BP: 124/84 105/77  Pulse: 76 90  Resp: 18 18  Temp: 97.9 F (36.6 C) 99.2 F (37.3 C)  SpO2: 100% 100%   General: Alert, oriented. No obvious distress. Cardiac: RRR, no m/r/g Respiratory: CTAB, no increased WOB Abdominal: Flat, soft, nontender Extremities: 2+ pulses in the bilateral lower extremities  Significant Procedures: EGD with cauterization  Significant Labs and Imaging:  Recent Labs  Lab 03/26/23 1855 03/27/23 0848 03/27/23 1944 03/28/23 0915  WBC 5.1 4.7  --  4.9  HGB 8.9* 10.0* 8.8* 9.2*  HCT 26.5* 29.2*  --  27.1*  PLT 117* 129*  --  138*      Latest Ref Rng & Units 03/26/2023    4:33 AM 03/25/2023   10:01 AM 02/07/2023    7:43 PM  CMP  Glucose 70 - 99 mg/dL 95  94  80   BUN 8 - 23 mg/dL 12  17  10    Creatinine 0.61 - 1.24 mg/dL 4.09  8.11  9.14   Sodium 135 - 145 mmol/L  140  138  137   Potassium 3.5 - 5.1 mmol/L 3.6  3.9  3.7   Chloride 98 - 111 mmol/L 109  109  111   CO2 22 - 32 mmol/L 22  24  21    Calcium 8.9 - 10.3 mg/dL 8.6  8.3  8.3   Total Protein 6.5 - 8.1 g/dL 5.6   6.8   Total Bilirubin 0.3 - 1.2 mg/dL 0.7   0.9   Alkaline Phos 38 - 126 U/L 46   62   AST 15 - 41 U/L 22   27   ALT 0 - 44 U/L 25   26      Discharge Medications:  Allergies as of 03/28/2023   No Known Allergies      Medication List      STOP taking these medications    adapalene 0.1 % gel Commonly known as: DIFFERIN   lisinopril 40 MG tablet Commonly known as: ZESTRIL   traZODone 50 MG tablet Commonly known as: DESYREL       TAKE these medications    acetaminophen 325 MG tablet Commonly known as: TYLENOL Take 650 mg by mouth every 6 (six) hours as needed for moderate pain.   augmented betamethasone dipropionate 0.05 % cream Commonly known as: DIPROLENE-AF Apply 1 Application topically 2 (two) times daily.   carvedilol 25 MG tablet Commonly known as: COREG Take 12.5 mg by mouth 2 (two) times daily with a meal.   cyclobenzaprine 10 MG tablet Commonly known as: FLEXERIL Take 10 mg by mouth at bedtime as needed for muscle spasms.   diclofenac Sodium 1 % Gel Commonly known as: VOLTAREN Apply 1 application topically daily as needed (pain).   gabapentin 100 MG capsule Commonly known as: NEURONTIN Take 100 mg by mouth at bedtime.   hydroxychloroquine 200 MG tablet Commonly known as: PLAQUENIL Take 400 mg by mouth daily.   Ipratropium-Albuterol 20-100 MCG/ACT Aers respimat Commonly known as: COMBIVENT Inhale 1 puff into the lungs 4 (four) times daily as needed for shortness of breath.   Melatonin 3 MG Caps Take 3 mg by mouth at bedtime.   nicotine 7 mg/24hr patch Commonly known as: NICODERM CQ - dosed in mg/24 hr Place 1 patch (7 mg total) onto the skin daily.   NONFORMULARY OR COMPOUNDED ITEM 0.5-1 mLs by Intracavernosal route daily as needed (ED). Prostaglandin 10mg /mL Papaverine 30mg /mL Phentolamine 1mg /mL   pantoprazole 40 MG tablet Commonly known as: PROTONIX Take 1 tablet (40 mg total) by mouth 2 (two) times daily for 14 days, THEN 1 tablet (40 mg total) daily. Start taking on: March 28, 2023 What changed: See the new instructions.   polyethylene glycol 17 g packet Commonly known as: MIRALAX / GLYCOLAX Take 17 g by mouth daily as needed for moderate constipation.   rivaroxaban  20 MG Tabs tablet Commonly known as: XARELTO Take 1 tablet (20 mg total) by mouth daily with breakfast. Start taking on: March 29, 2023   rosuvastatin 40 MG tablet Commonly known as: CRESTOR Take 40 mg by mouth at bedtime.   SEMAGLUTIDE (2 MG/DOSE) Pray Inject 2 mg into the skin once a week.   torsemide 20 MG tablet Commonly known as: DEMADEX Take 10 mg by mouth daily as needed (edema).        Discharge Instructions: Please refer to Patient Instructions section of EMR for full details.  Patient was counseled important signs and symptoms that should prompt return to medical care, changes in medications, dietary instructions,  activity restrictions, and follow up appointments.   Follow-Up Appointments:  Follow-up Information     Clinic, [facility] Schedule an appointment as soon as possible for a visit.   Why: Make appointment for hospital follow-up ASAP. Contact information: [address] [facility] [place] [address] [phone]                 Gerrit Heck, DO 03/28/2023, 12:44 PM PGY-1, [facility]   I agree with the assessment and plan as documented above.  Janeal Holmes, MD PGY-2, [facility]

## 2023-03-28 NOTE — Assessment & Plan Note (Deleted)
Management as above.  Appreciate GI recommendations.

## 2023-03-28 NOTE — Assessment & Plan Note (Deleted)
EGD yesterday revealed actively bleeding dieulafoy lesion in the duodenum.  This was cauterized.  No further evidence of bleeding this morning. - appreciate GI recs -Transition Protonix to p.o. 40 mg twice daily -Continue to hold Xarelto, may resume tomorrow if stable hemoglobin and no signs of bleeding -Orthostatic vitals stable yesterday -Tolerating solid diet without issue

## 2023-03-28 NOTE — Plan of Care (Signed)
  Problem: Education: Goal: Knowledge of General Education information will improve Description: Including pain rating scale, medication(s)/side effects and non-pharmacologic comfort measures Outcome: Progressing   Problem: Health Behavior/Discharge Planning: Goal: Ability to manage health-related needs will improve Outcome: Progressing   Problem: Clinical Measurements: Goal: Will remain free from infection Outcome: Progressing Goal: Respiratory complications will improve Outcome: Progressing Goal: Cardiovascular complication will be avoided Outcome: Progressing   Problem: Activity: Goal: Risk for activity intolerance will decrease Outcome: Progressing   Problem: Nutrition: Goal: Adequate nutrition will be maintained Outcome: Progressing   Problem: Coping: Goal: Level of anxiety will decrease Outcome: Progressing   Problem: Elimination: Goal: Will not experience complications related to urinary retention Outcome: Progressing

## 2023-03-29 ENCOUNTER — Encounter (HOSPITAL_COMMUNITY): Payer: Self-pay | Admitting: Gastroenterology

## 2023-05-11 ENCOUNTER — Telehealth: Payer: Self-pay

## 2023-05-11 NOTE — Telephone Encounter (Signed)
[employer] Watchman [employer] report for May 2021 cCT: "Cameron Schultz: Large chicken wing anatomy with narrowing at the neck. Max 30/ AVG 27/ Depth 21 Likely use a 35 mm device and double curve sheath. Very inf (SI)/Mid to ant (AP) TSP RAO 2 CAU 6"

## 2023-06-23 NOTE — H&P (View-Only) (Signed)
 Electrophysiology Office Note:   Date:  06/25/2023  ID:  [name]., DOB 11/24/1955, MRN [id number]  Primary Cardiologist: Kristeen Miss, MD Electrophysiologist: [name], MD      History of Present Illness:   [name]. is a 68 y.o. male with h/o paroxysmal atrial fibrillation status post PVI (09/2019), coronary artery disease status post one-vessel CABG in 2013, hypertension, former tobacco abuse, sleep apnea who is being seen today for evaluation for watchman implantation at the request of Dr. Dione Plover with the [facility].   Patient has had 3 admissions to the hospital for GI bleeding.  In 2024 he had a 3-day admission after presenting with melena which was associated with fatigue and dizziness.  He underwent EGD and was found to have a bleeding lesion in the duodenum which was clipped/cauterized.  He has also had presentations for hematuria.  Discussed the use of AI scribe software for clinical note transcription with the patient, who gave verbal consent to proceed.  History of Present Illness   The patient, with a history of atrial fibrillation and GI bleeding, presents for a consultation regarding the Watchman procedure. They have been on blood thinners to manage their atrial fibrillation, but this has led to several hospitalizations due to GI bleeding. The patient had an ablation years ago and has experienced two to three episodes of atrial fibrillation since then. They do not currently experience any fluttering or skipping of the heart. The patient has researched the Watchman procedure and is interested in it as a potential alternative to blood thinners. They express concern about the risk of future GI bleeding episodes and are interested in a solution that could potentially reduce this risk. He has not needed transfusion, but has reportedly come close. No new or acute complaints today.     Review of systems complete and found to be negative unless listed in HPI.    EP Information / Studies Reviewed:    EKG is ordered today. Personal review as below.  EKG Interpretation Date/Time:  Friday June 24 2023 10:09:52 EST Ventricular Rate:  78 PR Interval:  164 QRS Duration:  84 QT Interval:  398 QTC Calculation: 453 R Axis:   -11  Text Interpretation: Normal sinus rhythm Minimal voltage criteria for LVH, may be normal variant ( R in aVL ) Inferior infarct (cited on or before 19-Aug-2019) When compared with ECG of 25-Mar-2023 09:41, No significant change was found Confirmed by [name] [phone]) on 06/24/2023 10:34:24 AM  EKG 08/18/19:   Nuclear medicine stress testing 08/31/2022 Impression: 1. Perfusion imaging reveals there there is a medium to large sized,  moderate to severe intensity, basal to distal inferior/inferolateral  predominantly fixed defect with corresponding hypokinesis consistent with  infarct. There is no significant ischemia. 2. Ejection fraction is calculated at 41% during stress. 3. Correlate clinically.  Transthoracic echocardiogram 09/01/2022 There is mild concentric left ventricular hypertrophy. Ejection Fraction = >55% (Visual Estimation). The left ventricular wall motion is normal. Grade I diastolic dysfunction, (abnormal relaxation pattern). The right atrium is mildly dilated. The left atrium is mildly dilated. Right ventricular systolic pressure unable to be evaluated due to insufficient TR. The IVC is normal in size with an inspiratory collapse of greater then 50%, suggesting normal right atrial pressure. The aortic root is normal size. There is no pericardial effusion.   Risk Assessment/Calculations:    CHA2DS2-VASc Score = 3   This indicates a 3.2% annual risk of stroke. The patient's score is based upon:  CHF History: 0 HTN History: 1 Diabetes History: 0 Stroke History: 0 Vascular Disease History: 1 Age Score: 1 Gender Score: 0             Physical Exam:   VS:  BP 102/62   Pulse 78   Ht 6'  1" (1.854 m)   Wt 248 lb 12.8 oz (112.9 kg)   SpO2 99%   BMI 32.83 kg/m    Wt Readings from Last 3 Encounters:  06/24/23 248 lb 12.8 oz (112.9 kg)  03/25/23 250 lb (113.4 kg)  02/07/23 280 lb (127 kg)     GEN: Well nourished, well developed in no acute distress NECK: No JVD CARDIAC: Normal rate, regular rhythm.  RESPIRATORY:  Clear to auscultation without rales, wheezing or rhonchi  ABDOMEN: Soft, non-tender, non-distended EXTREMITIES:  No edema; No deformity   ASSESSMENT AND PLAN:   [name]. is a 68 y.o. male with h/o paroxysmal atrial fibrillation status post PVI (09/2019), coronary artery disease status post one-vessel CABG in 2013, hypertension, former tobacco abuse, sleep apnea who is being seen today for evaluation for watchman implantation at the request of Dr. Dione Plover with the [facility].   I have seen [name]. in the office today who is being considered for a Watchman left atrial appendage closure device. I believe they will benefit from this procedure given their history of atrial fibrillation, CHA2DS2-VASc score of 3 and unadjusted ischemic stroke rate of 3.2% per year. Unfortunately, the patient is not felt to be a long term anticoagulation candidate secondary to recurrent episodes of GI bleeding. The patient's chart has been reviewed and I feel that they would be a candidate for short term oral anticoagulation after Watchman implant.   It is my belief that after undergoing a LAA closure procedure, [name]. will not need long term anticoagulation which eliminates anticoagulation side effects and major bleeding risk.   Procedural risks for the Watchman implant have been reviewed with the patient including a 0.5% risk of stroke, <1% risk of perforation and <1% risk of device embolization. Other risks include bleeding, vascular damage, tamponade, worsening renal function, and death. The patient understands these risk and wishes to proceed.      The published clinical data on the safety and effectiveness of WATCHMAN include but are not limited to the following: - Holmes DR, Everlene Farrier, Sick P et al. for the PROTECT AF Investigators. Percutaneous closure of the left atrial appendage versus warfarin therapy for prevention of stroke in patients with atrial fibrillation: a randomised non-inferiority trial. Lancet 2009; 374: 534-42. Everlene Farrier, Doshi SK, Isa Rankin D et al. on behalf of the PROTECT AF Investigators. Percutaneous Left Atrial Appendage Closure for Stroke Prophylaxis in Patients With Atrial Fibrillation 2.3-Year Follow-up of the PROTECT AF (Watchman Left Atrial Appendage System for Embolic Protection in Patients With Atrial Fibrillation) Trial. Circulation 2013; 127:720-729. - Alli O, Doshi S,  Kar S, Reddy VY, Sievert H et al. Quality of Life Assessment in the Randomized PROTECT AF (Percutaneous Closure of the Left Atrial Appendage Versus Warfarin Therapy for Prevention of Stroke in Patients With Atrial Fibrillation) Trial of Patients at Risk for Stroke With Nonvalvular Atrial Fibrillation. J Am Coll Cardiol 2013; 61:1790-8. Aline August DR, Mia [place], Price M, Whisenant B, Sievert H, Doshi S, Huber K, Reddy V. Prospective randomized evaluation of the Watchman left atrial appendage Device in patients with atrial fibrillation versus long-term warfarin therapy; the PREVAIL trial. Journal  of the [employer] of Cardiology, Vol. 4, No. 1, 2014, 1-11. - Kar S, Doshi SK, Sadhu A, Horton R, Osorio J et al. Primary outcome evaluation of a next-generation left atrial appendage closure device: results from the PINNACLE FLX trial. Circulation 2021;143(18)1754-1762.    After today's visit with the patient which was dedicated solely for shared decision making visit regarding LAA closure device, the patient decided to proceed with the LAA appendage closure procedure scheduled to be done in the near future at [facility].  The patient has  a CT scan from 2021 there was performed prior to A-fib ablation.  I will ask one of my imaging colleagues to review the scan and provide [place] with recommendations regarding watchman candidacy.  HAS-BLED score 3 Hypertension Yes  Abnormal renal and liver function (Dialysis, transplant, Cr >2.26 mg/dL /Cirrhosis or Bilirubin >2x Normal or AST/ALT/AP >3x Normal) No  Stroke No  Bleeding Yes  Labile INR (Unstable/high INR) No  Elderly (>65) Yes  Drugs or alcohol (>= 8 drinks/week, anti-plt or NSAID) No   CHA2DS2-VASc Score = 3  The patient's score is based upon: CHF History: 0 HTN History: 1 Diabetes History: 0 Stroke History: 0 Vascular Disease History: 1 Age Score: 1 Gender Score: 0       ASSESSMENT AND PLAN: Paroxysmal Atrial Fibrillation (ICD10:  I48.0) The patient's CHA2DS2-VASc score is 3, indicating a 3.2% annual risk of stroke.  Appears to have relatively low burden of atrial fibrillation after ablation.  Recurrent GI bleeding Secondary Hypercoagulable State (ICD10:  D68.69) The patient is at significant risk for stroke/thromboembolism based upon his CHA2DS2-VASc Score of 3.  Continue Rivaroxaban (Xarelto).   Signed, [name], MD

## 2023-06-23 NOTE — Progress Notes (Signed)
Electrophysiology Office Note:   Date:  06/25/2023  ID:  Cameron Schultz., DOB 11/24/1955, MRN [id number]  Primary Cardiologist: Kristeen Miss, MD Electrophysiologist: [name], MD      History of Present Illness:   Cameron Schultz. is a 68 y.o. male with h/o paroxysmal atrial fibrillation status post PVI (09/2019), coronary artery disease status post one-vessel CABG in 2013, hypertension, former tobacco abuse, sleep apnea who is being seen today for evaluation for watchman implantation at the request of Dr. Dione Plover with the [facility].   Patient has had 3 admissions to the hospital for GI bleeding.  In 2024 he had a 3-day admission after presenting with melena which was associated with fatigue and dizziness.  He underwent EGD and was found to have a bleeding lesion in the duodenum which was clipped/cauterized.  He has also had presentations for hematuria.  Discussed the use of AI scribe software for clinical note transcription with the patient, who gave verbal consent to proceed.  History of Present Illness   The patient, with a history of atrial fibrillation and GI bleeding, presents for a consultation regarding the Watchman procedure. They have been on blood thinners to manage their atrial fibrillation, but this has led to several hospitalizations due to GI bleeding. The patient had an ablation years ago and has experienced two to three episodes of atrial fibrillation since then. They do not currently experience any fluttering or skipping of the heart. The patient has researched the Watchman procedure and is interested in it as a potential alternative to blood thinners. They express concern about the risk of future GI bleeding episodes and are interested in a solution that could potentially reduce this risk. He has not needed transfusion, but has reportedly come close. No new or acute complaints today.     Review of systems complete and found to be negative unless listed in HPI.    EP Information / Studies Reviewed:    EKG is ordered today. Personal review as below.  EKG Interpretation Date/Time:  Friday June 24 2023 10:09:52 EST Ventricular Rate:  78 PR Interval:  164 QRS Duration:  84 QT Interval:  398 QTC Calculation: 453 R Axis:   -11  Text Interpretation: Normal sinus rhythm Minimal voltage criteria for LVH, may be normal variant ( R in aVL ) Inferior infarct (cited on or before 19-Aug-2019) When compared with ECG of 25-Mar-2023 09:41, No significant change was found Confirmed by [name] [phone]) on 06/24/2023 10:34:24 AM  EKG 08/18/19:   Nuclear medicine stress testing 08/31/2022 Impression: 1. Perfusion imaging reveals there there is a medium to large sized,  moderate to severe intensity, basal to distal inferior/inferolateral  predominantly fixed defect with corresponding hypokinesis consistent with  infarct. There is no significant ischemia. 2. Ejection fraction is calculated at 41% during stress. 3. Correlate clinically.  Transthoracic echocardiogram 09/01/2022 There is mild concentric left ventricular hypertrophy. Ejection Fraction = >55% (Visual Estimation). The left ventricular wall motion is normal. Grade I diastolic dysfunction, (abnormal relaxation pattern). The right atrium is mildly dilated. The left atrium is mildly dilated. Right ventricular systolic pressure unable to be evaluated due to insufficient TR. The IVC is normal in size with an inspiratory collapse of greater then 50%, suggesting normal right atrial pressure. The aortic root is normal size. There is no pericardial effusion.   Risk Assessment/Calculations:    CHA2DS2-VASc Score = 3   This indicates a 3.2% annual risk of stroke. The patient's score is based upon:  CHF History: 0 HTN History: 1 Diabetes History: 0 Stroke History: 0 Vascular Disease History: 1 Age Score: 1 Gender Score: 0             Physical Exam:   VS:  BP 102/62   Pulse 78   Ht 6'  1" (1.854 m)   Wt 248 lb 12.8 oz (112.9 kg)   SpO2 99%   BMI 32.83 kg/m    Wt Readings from Last 3 Encounters:  06/24/23 248 lb 12.8 oz (112.9 kg)  03/25/23 250 lb (113.4 kg)  02/07/23 280 lb (127 kg)     GEN: Well nourished, well developed in no acute distress NECK: No JVD CARDIAC: Normal rate, regular rhythm.  RESPIRATORY:  Clear to auscultation without rales, wheezing or rhonchi  ABDOMEN: Soft, non-tender, non-distended EXTREMITIES:  No edema; No deformity   ASSESSMENT AND PLAN:   Cameron Schultz. is a 68 y.o. male with h/o paroxysmal atrial fibrillation status post PVI (09/2019), coronary artery disease status post one-vessel CABG in 2013, hypertension, former tobacco abuse, sleep apnea who is being seen today for evaluation for watchman implantation at the request of Dr. Dione Plover with the [facility].   I have seen Cameron Schultz. in the office today who is being considered for a Watchman left atrial appendage closure device. I believe they will benefit from this procedure given their history of atrial fibrillation, CHA2DS2-VASc score of 3 and unadjusted ischemic stroke rate of 3.2% per year. Unfortunately, the patient is not felt to be a long term anticoagulation candidate secondary to recurrent episodes of GI bleeding. The patient's chart has been reviewed and I feel that they would be a candidate for short term oral anticoagulation after Watchman implant.   It is my belief that after undergoing a LAA closure procedure, Cameron Schultz. will not need long term anticoagulation which eliminates anticoagulation side effects and major bleeding risk.   Procedural risks for the Watchman implant have been reviewed with the patient including a 0.5% risk of stroke, <1% risk of perforation and <1% risk of device embolization. Other risks include bleeding, vascular damage, tamponade, worsening renal function, and death. The patient understands these risk and wishes to proceed.      The published clinical data on the safety and effectiveness of WATCHMAN include but are not limited to the following: - Holmes DR, Everlene Farrier, Sick P et al. for the PROTECT AF Investigators. Percutaneous closure of the left atrial appendage versus warfarin therapy for prevention of stroke in patients with atrial fibrillation: a randomised non-inferiority trial. Lancet 2009; 374: 534-42. Everlene Farrier, Doshi SK, Isa Rankin D et al. on behalf of the PROTECT AF Investigators. Percutaneous Left Atrial Appendage Closure for Stroke Prophylaxis in Patients With Atrial Fibrillation 2.3-Year Follow-up of the PROTECT AF (Watchman Left Atrial Appendage System for Embolic Protection in Patients With Atrial Fibrillation) Trial. Circulation 2013; 127:720-729. - Alli O, Doshi S,  Kar S, Reddy VY, Sievert H et al. Quality of Life Assessment in the Randomized PROTECT AF (Percutaneous Closure of the Left Atrial Appendage Versus Warfarin Therapy for Prevention of Stroke in Patients With Atrial Fibrillation) Trial of Patients at Risk for Stroke With Nonvalvular Atrial Fibrillation. J Am Coll Cardiol 2013; 61:1790-8. Aline August DR, Mia [place], Price M, Whisenant B, Sievert H, Doshi S, Huber K, Reddy V. Prospective randomized evaluation of the Watchman left atrial appendage Device in patients with atrial fibrillation versus long-term warfarin therapy; the PREVAIL trial. Journal  of the [employer] of Cardiology, Vol. 4, No. 1, 2014, 1-11. - Kar S, Doshi SK, Sadhu A, Horton R, Osorio J et al. Primary outcome evaluation of a next-generation left atrial appendage closure device: results from the PINNACLE FLX trial. Circulation 2021;143(18)1754-1762.    After today's visit with the patient which was dedicated solely for shared decision making visit regarding LAA closure device, the patient decided to proceed with the LAA appendage closure procedure scheduled to be done in the near future at [facility].  The patient has  a CT scan from 2021 there was performed prior to A-fib ablation.  I will ask one of my imaging colleagues to review the scan and provide [place] with recommendations regarding watchman candidacy.  HAS-BLED score 3 Hypertension Yes  Abnormal renal and liver function (Dialysis, transplant, Cr >2.26 mg/dL /Cirrhosis or Bilirubin >2x Normal or AST/ALT/AP >3x Normal) No  Stroke No  Bleeding Yes  Labile INR (Unstable/high INR) No  Elderly (>65) Yes  Drugs or alcohol (>= 8 drinks/week, anti-plt or NSAID) No   CHA2DS2-VASc Score = 3  The patient's score is based upon: CHF History: 0 HTN History: 1 Diabetes History: 0 Stroke History: 0 Vascular Disease History: 1 Age Score: 1 Gender Score: 0       ASSESSMENT AND PLAN: Paroxysmal Atrial Fibrillation (ICD10:  I48.0) The patient's CHA2DS2-VASc score is 3, indicating a 3.2% annual risk of stroke.  Appears to have relatively low burden of atrial fibrillation after ablation.  Recurrent GI bleeding Secondary Hypercoagulable State (ICD10:  D68.69) The patient is at significant risk for stroke/thromboembolism based upon his CHA2DS2-VASc Score of 3.  Continue Rivaroxaban (Xarelto).   Signed, [name], MD

## 2023-06-24 ENCOUNTER — Encounter: Payer: Self-pay | Admitting: Cardiology

## 2023-06-24 ENCOUNTER — Ambulatory Visit: Payer: No Typology Code available for payment source | Attending: Cardiology | Admitting: Cardiology

## 2023-06-24 VITALS — BP 102/62 | HR 78 | Ht 73.0 in | Wt 248.8 lb

## 2023-06-24 DIAGNOSIS — I4891 Unspecified atrial fibrillation: Secondary | ICD-10-CM | POA: Diagnosis not present

## 2023-06-24 DIAGNOSIS — K922 Gastrointestinal hemorrhage, unspecified: Secondary | ICD-10-CM

## 2023-06-24 DIAGNOSIS — D6869 Other thrombophilia: Secondary | ICD-10-CM | POA: Diagnosis not present

## 2023-06-24 DIAGNOSIS — I480 Paroxysmal atrial fibrillation: Secondary | ICD-10-CM | POA: Diagnosis not present

## 2023-06-24 NOTE — Patient Instructions (Signed)
Medication Instructions:  Your physician recommends that you continue on your current medications as directed. Please refer to the Current Medication list given to you today.  *If you need a refill on your cardiac medications before your next appointment, please call your pharmacy*   Testing/Procedures: Watchman Your physician has requested that you have Left atrial appendage (LAA) closure device implantation is a procedure to put a small device in the LAA of the heart. The LAA is a small sac in the wall of the heart's left upper chamber. Blood clots can form in this area. The device, Watchman closes the LAA to help prevent a blood clot and stroke.   Follow-Up: At [facility], you and your health needs are our priority.  As part of our continuing mission to provide you with exceptional heart care, we have created designated Provider Care Teams.  These Care Teams include your primary Cardiologist (physician) and Advanced Practice Providers (APPs -  Physician Assistants and Nurse Practitioners) who all work together to provide you with the care you need, when you need it.  You will be contacted by Nurse Navigator, Karsten Fells to schedule your pre-procedure visit and procedure date. If you have any questions she can be reached at [phone].

## 2023-07-01 ENCOUNTER — Telehealth: Payer: Self-pay | Admitting: Cardiology

## 2023-07-01 DIAGNOSIS — R911 Solitary pulmonary nodule: Secondary | ICD-10-CM

## 2023-07-01 DIAGNOSIS — I480 Paroxysmal atrial fibrillation: Secondary | ICD-10-CM

## 2023-07-01 NOTE — Telephone Encounter (Signed)
Per pt Dr Jimmey Ralph told he was ordering a CT Scan before he did a Watchman Procedure. But patient says he has not heard from anyone. Please advise

## 2023-07-01 NOTE — Telephone Encounter (Signed)
Spoke with the patient and advised that he had a CT scan in 2021 and the cardiac imagers will be reviewing it, so he does not need another one at this time. Patient verbalized understanding.

## 2023-07-05 ENCOUNTER — Other Ambulatory Visit: Payer: Self-pay

## 2023-07-05 DIAGNOSIS — K922 Gastrointestinal hemorrhage, unspecified: Secondary | ICD-10-CM

## 2023-07-05 DIAGNOSIS — I480 Paroxysmal atrial fibrillation: Secondary | ICD-10-CM

## 2023-07-06 NOTE — Telephone Encounter (Addendum)
 Discussed with Dr. Kennyth. Due to incidentals on CT scan from 2021 (lung nodule and dilated aorta), will schedule CTs prior to LAAO.  Spoke with the patient at length. He understood necessity of repeating imaging prior to procedure 2/20. Scheduled him for CT (cardiac and chest) 07/08/2023. He will have labs drawn tomorrow. He was grateful for call and agreed with plan.

## 2023-07-06 NOTE — Addendum Note (Signed)
Addended by: Gunnar Fusi A on: 07/06/2023 05:10 PM   Modules accepted: Orders

## 2023-07-06 NOTE — Addendum Note (Signed)
Addended by: Gunnar Fusi A on: 07/06/2023 05:49 PM   Modules accepted: Orders

## 2023-07-06 NOTE — Addendum Note (Signed)
Addended by: Gunnar Fusi A on: 07/06/2023 05:32 PM   Modules accepted: Orders

## 2023-07-07 LAB — BASIC METABOLIC PANEL
BUN/Creatinine Ratio: 13 (ref 10–24)
BUN: 12 mg/dL (ref 8–27)
CO2: 20 mmol/L (ref 20–29)
Calcium: 8.7 mg/dL (ref 8.6–10.2)
Chloride: 103 mmol/L (ref 96–106)
Creatinine, Ser: 0.93 mg/dL (ref 0.76–1.27)
Glucose: 89 mg/dL (ref 70–99)
Potassium: 4.4 mmol/L (ref 3.5–5.2)
Sodium: 140 mmol/L (ref 134–144)
eGFR: 91 mL/min/{1.73_m2} (ref >59)

## 2023-07-08 ENCOUNTER — Ambulatory Visit (HOSPITAL_COMMUNITY)
Admission: RE | Admit: 2023-07-08 | Discharge: 2023-07-08 | Disposition: A | Discharge status: Home / Self Care | Payer: No Typology Code available for payment source | Source: Ambulatory Visit | Attending: Internal Medicine | Admitting: Internal Medicine

## 2023-07-08 DIAGNOSIS — I480 Paroxysmal atrial fibrillation: Secondary | ICD-10-CM

## 2023-07-08 DIAGNOSIS — I77819 Aortic ectasia, unspecified site: Secondary | ICD-10-CM | POA: Diagnosis not present

## 2023-07-08 DIAGNOSIS — Q2112 Patent foramen ovale: Secondary | ICD-10-CM | POA: Insufficient documentation

## 2023-07-08 DIAGNOSIS — R911 Solitary pulmonary nodule: Secondary | ICD-10-CM

## 2023-07-08 DIAGNOSIS — Z951 Presence of aortocoronary bypass graft: Secondary | ICD-10-CM | POA: Diagnosis not present

## 2023-07-08 LAB — CBC WITH DIFFERENTIAL/PLATELET
Basophils Absolute: 0 10*3/uL (ref 0.0–0.2)
Basos: 1 %
EOS (ABSOLUTE): 0.1 10*3/uL (ref 0.0–0.4)
Eos: 3 %
Hematocrit: 34 % — ABNORMAL LOW (ref 37.5–51.0)
Hemoglobin: 9.9 g/dL — ABNORMAL LOW (ref 13.0–17.7)
Immature Grans (Abs): 0 10*3/uL (ref 0.0–0.1)
Immature Granulocytes: 0 %
Lymphocytes Absolute: 1.5 10*3/uL (ref 0.7–3.1)
Lymphs: 41 %
MCH: 23.1 pg — ABNORMAL LOW (ref 26.6–33.0)
MCHC: 29.1 g/dL — ABNORMAL LOW (ref 31.5–35.7)
MCV: 79 fL (ref 79–97)
Monocytes Absolute: 0.4 10*3/uL (ref 0.1–0.9)
Monocytes: 12 %
Neutrophils Absolute: 1.6 10*3/uL (ref 1.4–7.0)
Neutrophils: 43 %
Platelets: 212 10*3/uL (ref 150–450)
RBC: 4.29 x10E6/uL (ref 4.14–5.80)
RDW: 15.9 % — ABNORMAL HIGH (ref 11.6–15.4)
WBC: 3.6 10*3/uL (ref 3.4–10.8)

## 2023-07-08 MED ORDER — IOHEXOL 350 MG/ML SOLN
100.0000 mL | Freq: Once | INTRAVENOUS | Status: AC | PRN
  Administered 2023-07-08: 100 mL via INTRAVENOUS

## 2023-07-11 NOTE — Progress Notes (Signed)
[facility]                                     Cardiology Office Note:    Date:  07/13/2023   ID:  Cameron Schultz., DOB Jan 18, 1956, MRN [id number]  PCP:  [facility], [name]  [facility] Cardiologist:  Kristeen Miss, MD  [facility] Electrophysiologist:  [name], MD   Referring MD: [facility], [name]   Chief Complaint  Patient presents with   Pre LAAO    History of Present Illness:    Cameron Schultz. is a 68 y.o. male with a hx of PAF s/p PVI (09/2019), CAD s/ one-vessel CABG in 2013, HTN, former tobacco abuse, sleep apnea who is being seen for pre LAAO evaluation scheduled for 07/21/23.    [name] has had three hospital admissions for GI bleeding. In 2024 he had a 3-day admission after presenting with melena which was associated with fatigue and dizziness.  He underwent EGD and was found to have a bleeding lesion in the duodenum which was clipped/cauterized.  He has also had presentations for hematuria.  He was then referred to [name] for LAAO consideration and was felt to be a good candidate to proceed.   Today he is here for pre procedure evaluation. He has been well with no chest pain, SOB, palpitations, LE edema, orthopnea, PND, dizziness, or syncope. Denies bleeding in stool or urine.    Past Medical History:  Diagnosis Date   Acute upper GI bleed    Atrial fibrillation [facility])    Atrial flutter (HCC)    Chronic anticoagulation    Coronary artery disease    Essential hypertension 08/18/2019   Gastrointestinal hemorrhage 12/20/2016   Hyperlipidemia 08/18/2019   Myocardial infarction [facility])    unknown   Obesity (BMI 30-39.9) 08/18/2019   OSA on CPAP 08/18/2019   Sleep apnea    Tobacco abuse 08/18/2019    Past Surgical History:  Procedure Laterality Date   ATRIAL FIBRILLATION ABLATION N/A 10/23/2019   Procedure: ATRIAL FIBRILLATION ABLATION;  Surgeon: Hillis Range, MD;  Location: [facility] INVASIVE CV LAB;  Service:  Cardiovascular;  Laterality: N/A;   BACK SURGERY     CERVICAL FUSION     CORONARY ARTERY BYPASS GRAFT  2013   1   CORONARY ARTERY BYPASS GRAFT     [facility]   ESOPHAGOGASTRODUODENOSCOPY (EGD) WITH PROPOFOL N/A 12/21/2016   Procedure: ESOPHAGOGASTRODUODENOSCOPY (EGD) WITH PROPOFOL;  Surgeon: Kathi Der, MD;  Location: [facility] ENDOSCOPY;  Service: Gastroenterology;  Laterality: N/A;   ESOPHAGOGASTRODUODENOSCOPY (EGD) WITH PROPOFOL N/A 03/26/2023   Procedure: ESOPHAGOGASTRODUODENOSCOPY (EGD) WITH PROPOFOL;  Surgeon: Benancio Deeds, MD;  Location: [facility] ENDOSCOPY;  Service: Gastroenterology;  Laterality: N/A;   HEMOSTASIS CLIP PLACEMENT  03/26/2023   Procedure: HEMOSTASIS CLIP PLACEMENT;  Surgeon: Benancio Deeds, MD;  Location: [facility] ENDOSCOPY;  Service: Gastroenterology;;   HOT HEMOSTASIS N/A 03/26/2023   Procedure: HOT HEMOSTASIS (ARGON PLASMA COAGULATION/BICAP);  Surgeon: Benancio Deeds, MD;  Location: [facility] ENDOSCOPY;  Service: Gastroenterology;  Laterality: N/A;   TONSILLECTOMY     TOTAL HIP ARTHROPLASTY Left 02/22/2019   Procedure: TOTAL HIP ARTHROPLASTY ANTERIOR APPROACH;  Surgeon: Samson Frederic, MD;  Location: [facility] ORS;  Service: Orthopedics;  Laterality: Left;    Current Medications: Current Meds  Medication Sig   acetaminophen (TYLENOL) 325 MG tablet Take 650 mg  by mouth every 6 (six) hours as needed for moderate pain.   augmented betamethasone dipropionate (DIPROLENE-AF) 0.05 % cream Apply 1 Application topically 2 (two) times daily.   carvedilol (COREG) 25 MG tablet Take 12.5 mg by mouth 2 (two) times daily with a meal.   cyclobenzaprine (FLEXERIL) 10 MG tablet Take 10 mg by mouth at bedtime as needed for muscle spasms.   diclofenac Sodium (VOLTAREN) 1 % GEL Apply 1 application  topically daily as needed (pain).   gabapentin (NEURONTIN) 100 MG capsule Take 100 mg by mouth at bedtime.   hydroxychloroquine (PLAQUENIL) 200 MG tablet Take 400 mg by  mouth daily.   Ipratropium-Albuterol (COMBIVENT) 20-100 MCG/ACT AERS respimat Inhale 1 puff into the lungs 4 (four) times daily as needed for shortness of breath.   Melatonin 3 MG CAPS Take 3 mg by mouth at bedtime.   NONFORMULARY OR COMPOUNDED ITEM 0.5-1 mLs by Intracavernosal route daily as needed (ED). Prostaglandin 10mg /mL Papaverine 30mg /mL Phentolamine 1mg /mL   polyethylene glycol (MIRALAX / GLYCOLAX) 17 g packet Take 17 g by mouth daily as needed for moderate constipation.   rivaroxaban (XARELTO) 20 MG TABS tablet Take 1 tablet (20 mg total) by mouth daily with breakfast.   rosuvastatin (CRESTOR) 40 MG tablet Take 40 mg by mouth at bedtime.   SEMAGLUTIDE, 2 MG/DOSE, [place] Inject 2 mg into the skin once a week.   [DISCONTINUED] nicotine (NICODERM CQ - DOSED IN MG/24 HR) 7 mg/24hr patch Place 1 patch (7 mg total) onto the skin daily.   [DISCONTINUED] torsemide (DEMADEX) 20 MG tablet Take 10 mg by mouth daily as needed (edema).     Allergies:   Patient has no known allergies.   Social History   Socioeconomic History   Marital status: Married    Spouse name: Not on file   Number of children: Not on file   Years of education: Not on file   Highest education level: Not on file  Occupational History   Not on file  Tobacco Use   Smoking status: Former    Current packs/day: 0.00    Average packs/day: 0.3 packs/day for 15.0 years (3.8 ttl pk-yrs)    Types: Cigarettes    Start date: 10/05/2006    Quit date: 10/04/2021    Years since quitting: 1.7   Smokeless tobacco: Never   Tobacco comments:    3 cigarettes daily  Vaping Use   Vaping status: Never Used  Substance and Sexual Activity   Alcohol use: No   Drug use: No   Sexual activity: Not on file  Other Topics Concern   Not on file  Social History Narrative   Lives in [place] with wife.   Disabled vet   Social Drivers of Health   Financial Resource Strain: Low Risk  (11/05/2022)   Received from [facility]   Overall  Financial Resource Strain (CARDIA)    Difficulty of Paying Living Expenses: Not hard at all  Food Insecurity: No Food Insecurity (11/05/2022)   Received from [facility]   Hunger Vital Sign    Worried About Running Out of Food in the Last Year: Never true    Ran Out of Food in the Last Year: Never true  Transportation Needs: No Transportation Needs (11/05/2022)   Received from [facility] - Transportation    Lack of Transportation (Medical): No    Lack of Transportation (Non-Medical): No  Physical Activity: Unknown (11/05/2022)   Received from [facility]   Exercise  Vital Sign    Days of Exercise per Week: Patient declined    Minutes of Exercise per Session: Not on file  Stress: No Stress Concern Present (11/05/2022)   Received from [facility] - Occupational Stress Questionnaire    Feeling of Stress : Not at all  Social Connections: Somewhat Isolated (11/05/2022)   Received from [facility]   Social Network    How would you rate your social network (family, work, friends)?: Restricted participation with some degree of social isolation     Family History: The patient's family history includes Heart attack in his father.  ROS:   Please see the history of present illness.    All other systems reviewed and are negative.  EKGs/Labs/Other Studies Reviewed:    The following studies were reviewed today:   Cardiac Studies & Procedures   ______________________________________________________________________________________________   STRESS TESTS  MYOCARDIAL PERFUSION IMAGING 10/10/2019  Narrative  The left ventricular ejection fraction is moderately decreased (30-44%).  Nuclear stress EF: 37%.  There was no ST segment deviation noted during stress.  No T wave inversion was noted during stress.  Findings consistent with prior myocardial infarction.  This is an intermediate risk study.  1. There is a medium size  (6-8% of LV), severe, fixed perfusion defect present in the basal to mid inferolateral segments consistent with prior infarction. 2. There is no ischemia. 3. Dilated LV with reduced LVEF, 37%. 4. This is an intermediate risk study.   ECHOCARDIOGRAM  ECHOCARDIOGRAM COMPLETE 08/20/2019  Narrative ECHOCARDIOGRAM REPORT    Patient Name:   Cameron Schultz Date of Exam: 08/20/2019 Medical Rec #:  [id number]      Height:       74.0 in Accession #:    [id number]     Weight:       292.4 lb Date of Birth:  1956-03-21      BSA:          2.554 m Patient Age:    64 years       BP:           113/65 mmHg Patient Gender: M              HR:           66 bpm. Exam Location:  Inpatient  Procedure: 2D Echo  Indications:    Atrial Fibrillation 427.31 / I48.91  History:        Patient has prior history of Echocardiogram examinations, most recent 05/31/2019. CAD; Risk Factors:Hypertension, Dyslipidemia and Tobacco abuse.  Sonographer:    Leeroy Bock Turrentine Referring Phys: [id number] CHELSEA N FAIR  IMPRESSIONS   1. Left ventricular ejection fraction, by estimation, is 45 to 50%. The left ventricle has mildly decreased function. The left ventricle demonstrates regional wall motion abnormalities (see scoring diagram/findings for description). There is moderate left ventricular hypertrophy. Left ventricular diastolic parameters are consistent with Grade I diastolic dysfunction (impaired relaxation). There is moderate hypokinesis of the left ventricular, basal-mid inferior wall. 2. Right ventricular systolic function is mildly reduced. The right ventricular size is normal. 3. Left atrial size was severely dilated. 4. The mitral valve is abnormal. Mild mitral valve regurgitation. 5. The aortic valve is tricuspid. Aortic valve regurgitation is not visualized. 6. The inferior vena cava is dilated in size with >50% respiratory variability, suggesting right atrial pressure of 8 mmHg.  Comparison(s): Changes  from prior study are noted. 05/31/19: LVEF 50-55%.  FINDINGS Left Ventricle: Left ventricular  ejection fraction, by estimation, is 45 to 50%. The left ventricle has mildly decreased function. The left ventricle demonstrates regional wall motion abnormalities. Moderate hypokinesis of the left ventricular, basal-mid inferior wall. The left ventricular internal cavity size was normal in size. There is moderate left ventricular hypertrophy. Left ventricular diastolic parameters are consistent with Grade I diastolic dysfunction (impaired relaxation). Indeterminate filling pressures.  Right Ventricle: The right ventricular size is normal. No increase in right ventricular wall thickness. Right ventricular systolic function is mildly reduced.  Left Atrium: Left atrial size was severely dilated.  Right Atrium: Right atrial size was normal in size.  Pericardium: There is no evidence of pericardial effusion.  Mitral Valve: The mitral valve is abnormal. There is mild thickening of the mitral valve leaflet(s). Mild mitral valve regurgitation.  Tricuspid Valve: The tricuspid valve is grossly normal. Tricuspid valve regurgitation is not demonstrated.  Aortic Valve: The aortic valve is tricuspid. Aortic valve regurgitation is not visualized.  Pulmonic Valve: The pulmonic valve was normal in structure. Pulmonic valve regurgitation is not visualized.  Aorta: The aortic root and ascending aorta are structurally normal, with no evidence of dilitation.  Venous: The inferior vena cava is dilated in size with greater than 50% respiratory variability, suggesting right atrial pressure of 8 mmHg.  IAS/Shunts: No atrial level shunt detected by color flow Doppler.   LEFT VENTRICLE PLAX 2D LVIDd:         5.60 cm      Diastology LVIDs:         4.25 cm      LV e' lateral:   11.90 cm/s LV PW:         1.10 cm      LV E/e' lateral: 6.1 LV IVS:        1.10 cm      LV e' medial:    5.66 cm/s LVOT diam:     2.10 cm       LV E/e' medial:  12.7 LV SV:         60 LV SV Index:   24 LVOT Area:     3.46 cm  LV Volumes (MOD) LV vol d, MOD A2C: 121.0 ml LV vol d, MOD A4C: 145.0 ml LV vol s, MOD A2C: 64.7 ml LV vol s, MOD A4C: 74.2 ml LV SV MOD A2C:     56.3 ml LV SV MOD A4C:     145.0 ml LV SV MOD BP:      64.2 ml  RIGHT VENTRICLE RV S prime:     8.24 cm/s TAPSE (M-mode): 1.5 cm  LEFT ATRIUM              Index       RIGHT ATRIUM           Index LA diam:        4.60 cm  1.80 cm/m  RA Area:     21.10 cm LA Vol (A2C):   137.0 ml 53.64 ml/m RA Volume:   53.60 ml  20.98 ml/m LA Vol (A4C):   108.0 ml 42.28 ml/m LA Biplane Vol: 124.0 ml 48.55 ml/m AORTIC VALVE LVOT Vmax:   87.50 cm/s LVOT Vmean:  63.600 cm/s LVOT VTI:    0.174 m  AORTA Ao Root diam: 3.80 cm  MITRAL VALVE MV Area (PHT): 3.27 cm    SHUNTS MV Decel Time: 232 msec    Systemic VTI:  0.17 m MV E velocity: 72.00 cm/s  Systemic Diam: 2.10 cm  MV A velocity: 35.10 cm/s MV E/A ratio:  2.05  Zoila Shutter MD Electronically signed by Zoila Shutter MD Signature Date/Time: 08/20/2019/11:09:56 AM    Final          ______________________________________________________________________________________________       EKG:  EKG is ordered today.  The ekg ordered today demonstrates NSR with HR 70bpm   Recent Labs: 03/26/2023: ALT 25 07/07/2023: BUN 12; Creatinine, Ser 0.93; Hemoglobin 9.9; Platelets 212; Potassium 4.4; Sodium 140   Recent Lipid Panel    Component Value Date/Time   CHOL 104 08/18/2019 2100   TRIG 62 08/18/2019 2100   HDL 28 (L) 08/18/2019 2100   CHOLHDL 3.7 08/18/2019 2100   VLDL 12 08/18/2019 2100   LDLCALC 64 08/18/2019 2100   Risk Assessment/Calculations:    CHA2DS2-VASc Score = 4   This indicates a 3.2% annual risk of stroke. The patient's score is based upon: CHF History: 0 HTN History: 1 Diabetes History: 0 Stroke History: 0 Vascular Disease History: 1 Age Score: 1 Gender Score: 0    HAS-BLED score 3 Hypertension Yes  Abnormal renal and liver function (Dialysis, transplant, Cr >2.26 mg/dL /Cirrhosis or Bilirubin >2x Normal or AST/ALT/AP >3x Normal) No  Stroke No  Bleeding Yes  Labile INR (Unstable/high INR) No  Elderly (>65) Yes  Drugs or alcohol (>= 8 drinks/week, anti-plt or NSAID) No   Physical Exam:    VS:  BP (!) 120/90   Pulse 70   Ht 6\' 1"  (1.854 m)   Wt 248 lb 3.2 oz (112.6 kg)   SpO2 99%   BMI 32.75 kg/m     Wt Readings from Last 3 Encounters:  07/13/23 248 lb 3.2 oz (112.6 kg)  06/24/23 248 lb 12.8 oz (112.9 kg)  03/25/23 250 lb (113.4 kg)    General: Well developed, well nourished, NAD Lungs:Clear to ausculation bilaterally. No wheezes, rales, or rhonchi. Breathing is unlabored. Cardiovascular: RRR with S1 S2. No murmurs Extremities: No edema.  Neuro: Alert and oriented. No focal deficits. No facial asymmetry. MAE spontaneously. Psych: Responds to questions appropriately with normal affect.    ASSESSMENT/PLAN:    Paroxysmal Atrial Fibrillation: s/p PVI ablation 2021 and is interested in LAAO to avoid long term DOAC given hx of GI bleeding. Instruction letter reviewed with all questions answered. CHG soap given. Obtain BMET, CBC today.   CAD s/ one-vessel CABG in 2013: Denies anginal symptoms. Plan lifelong ASA after post LAAO therapy.   HTN: Well controlled today at 120/90. No changes   Tobacco Use: Recent cessation 2 weeks ago. Congratulated on this. Encouraged to continue.   I spent 25 minutes caring for this patient today including face-to-face discussions, ordering and reviewing labs, reviewing records from [facility] and other outside facilities, documenting in the record, and arranging for follow up.    Medication Adjustments/Labs and Tests Ordered: Current medicines are reviewed at length with the patient today.  Concerns regarding medicines are outlined above.  Orders Placed This Encounter  Procedures   Basic Metabolic Panel  (BMET)   CBC   EKG 12-Lead   No orders of the defined types were placed in this encounter.   Patient Instructions  Medication Instructions:  Your physician recommends that you continue on your current medications as directed. Please refer to the Current Medication list given to you today.  *If you need a refill on your cardiac medications before your next appointment, please call your pharmacy*  Lab Work: BMET, CBC-TODAY If you have labs (blood work)  drawn today and your tests are completely normal, you will receive your results only by: MyChart Message (if you have MyChart) OR A paper copy in the mail If you have any lab test that is abnormal or we need to change your treatment, we will call you to review the results.   Follow-Up: At [facility], you and your health needs are our priority.  As part of our continuing mission to provide you with exceptional heart care, we have created designated Provider Care Teams.  These Care Teams include your primary Cardiologist (physician) and Advanced Practice Providers (APPs -  Physician Assistants and Nurse Practitioners) who all work together to provide you with the care you need, when you need it.  We recommend signing up for the patient portal called "MyChart".  Sign up information is provided on this After Visit Summary.  MyChart is used to connect with patients for Virtual Visits (Telemedicine).  Patients are able to view lab/test results, encounter notes, upcoming appointments, etc.  Non-urgent messages can be sent to your provider as well.   To learn more about what you can do with MyChart, go to [email or web address].    Your next appointment:  Jorja Loa, PA 08/29/23   Signed, Georgie Chard, NP  07/13/2023 9:59 AM    [facility]

## 2023-07-13 ENCOUNTER — Telehealth: Payer: Self-pay

## 2023-07-13 ENCOUNTER — Ambulatory Visit: Payer: No Typology Code available for payment source | Attending: Cardiology | Admitting: Cardiology

## 2023-07-13 ENCOUNTER — Encounter: Payer: Self-pay | Admitting: Cardiology

## 2023-07-13 VITALS — BP 120/90 | HR 70 | Ht 73.0 in | Wt 248.2 lb

## 2023-07-13 DIAGNOSIS — Z951 Presence of aortocoronary bypass graft: Secondary | ICD-10-CM

## 2023-07-13 DIAGNOSIS — I2510 Atherosclerotic heart disease of native coronary artery without angina pectoris: Secondary | ICD-10-CM

## 2023-07-13 DIAGNOSIS — I10 Essential (primary) hypertension: Secondary | ICD-10-CM

## 2023-07-13 DIAGNOSIS — I4891 Unspecified atrial fibrillation: Secondary | ICD-10-CM

## 2023-07-13 DIAGNOSIS — K922 Gastrointestinal hemorrhage, unspecified: Secondary | ICD-10-CM

## 2023-07-13 DIAGNOSIS — Z720 Tobacco use: Secondary | ICD-10-CM | POA: Diagnosis not present

## 2023-07-13 DIAGNOSIS — Z01818 Encounter for other preprocedural examination: Secondary | ICD-10-CM

## 2023-07-13 NOTE — Patient Instructions (Signed)
Medication Instructions:  Your physician recommends that you continue on your current medications as directed. Please refer to the Current Medication list given to you today.  *If you need a refill on your cardiac medications before your next appointment, please call your pharmacy*   Lab Work: BMET, CBC--TODAY If you have labs (blood work) drawn today and your tests are completely normal, you will receive your results only by: MyChart Message (if you have MyChart) OR A paper copy in the mail If you have any lab test that is abnormal or we need to change your treatment, we will call you to review the results.   Testing/Procedures: See letters  Follow-Up: At [facility], you and your health needs are our priority.  As part of our continuing mission to provide you with exceptional heart care, we have created designated Provider Care Teams.  These Care Teams include your primary Cardiologist (physician) and Advanced Practice Providers (APPs -  Physician Assistants and Nurse Practitioners) who all work together to provide you with the care you need, when you need it.  Your next appointment:   As scheduled

## 2023-07-13 NOTE — Telephone Encounter (Signed)
Cameron Schultz

## 2023-07-14 ENCOUNTER — Telehealth: Payer: Self-pay

## 2023-07-14 LAB — BASIC METABOLIC PANEL
BUN/Creatinine Ratio: 13 (ref 10–24)
BUN: 12 mg/dL (ref 8–27)
CO2: 21 mmol/L (ref 20–29)
Calcium: 8.8 mg/dL (ref 8.6–10.2)
Chloride: 104 mmol/L (ref 96–106)
Creatinine, Ser: 0.95 mg/dL (ref 0.76–1.27)
Glucose: 77 mg/dL (ref 70–99)
Potassium: 4.2 mmol/L (ref 3.5–5.2)
Sodium: 139 mmol/L (ref 134–144)
eGFR: 88 mL/min/{1.73_m2} (ref >59)

## 2023-07-14 LAB — CBC
Hematocrit: 34.4 % — ABNORMAL LOW (ref 37.5–51.0)
Hemoglobin: 10.4 g/dL — ABNORMAL LOW (ref 13.0–17.7)
MCH: 23.4 pg — ABNORMAL LOW (ref 26.6–33.0)
MCHC: 30.2 g/dL — ABNORMAL LOW (ref 31.5–35.7)
MCV: 77 fL — ABNORMAL LOW (ref 79–97)
Platelets: 189 10*3/uL (ref 150–450)
RBC: 4.45 x10E6/uL (ref 4.14–5.80)
RDW: 16 % — ABNORMAL HIGH (ref 11.6–15.4)
WBC: 4 10*3/uL (ref 3.4–10.8)

## 2023-07-14 NOTE — Telephone Encounter (Signed)
Confirmed procedure date of 07/21/2023. Confirmed arrival time of 0700 for procedure time at 0930. Reviewed pre-procedure instructions with patient. Confirmed he will hold Ozempic injection. Confirmed no contrast allergy and no cardiac devices. The patient understands to call if questions/concerns arise prior to procedure.  He was grateful for call and agreed with plan.

## 2023-07-14 NOTE — Telephone Encounter (Signed)
Left message to call back

## 2023-07-21 ENCOUNTER — Inpatient Hospital Stay (HOSPITAL_COMMUNITY)
Admission: RE | Admit: 2023-07-21 | Discharge: 2023-07-21 | DRG: 274 | Disposition: A | Discharge status: Home / Self Care | Payer: No Typology Code available for payment source | Attending: Cardiology | Admitting: Cardiology

## 2023-07-21 ENCOUNTER — Inpatient Hospital Stay (HOSPITAL_COMMUNITY): Payer: No Typology Code available for payment source

## 2023-07-21 ENCOUNTER — Encounter (HOSPITAL_COMMUNITY): Payer: Self-pay | Admitting: Cardiology

## 2023-07-21 ENCOUNTER — Other Ambulatory Visit: Payer: Self-pay

## 2023-07-21 ENCOUNTER — Encounter (HOSPITAL_COMMUNITY)
Admission: RE | Disposition: A | Discharge status: Home / Self Care | Payer: No Typology Code available for payment source | Source: Home / Self Care | Attending: Cardiology

## 2023-07-21 DIAGNOSIS — Z006 Encounter for examination for normal comparison and control in clinical research program: Secondary | ICD-10-CM

## 2023-07-21 DIAGNOSIS — E785 Hyperlipidemia, unspecified: Secondary | ICD-10-CM | POA: Diagnosis present

## 2023-07-21 DIAGNOSIS — I4819 Other persistent atrial fibrillation: Principal | ICD-10-CM

## 2023-07-21 DIAGNOSIS — D6869 Other thrombophilia: Secondary | ICD-10-CM | POA: Diagnosis present

## 2023-07-21 DIAGNOSIS — G4733 Obstructive sleep apnea (adult) (pediatric): Secondary | ICD-10-CM

## 2023-07-21 DIAGNOSIS — I252 Old myocardial infarction: Secondary | ICD-10-CM | POA: Diagnosis not present

## 2023-07-21 DIAGNOSIS — Z87891 Personal history of nicotine dependence: Secondary | ICD-10-CM

## 2023-07-21 DIAGNOSIS — I4891 Unspecified atrial fibrillation: Secondary | ICD-10-CM

## 2023-07-21 DIAGNOSIS — I480 Paroxysmal atrial fibrillation: Secondary | ICD-10-CM

## 2023-07-21 DIAGNOSIS — K922 Gastrointestinal hemorrhage, unspecified: Secondary | ICD-10-CM | POA: Diagnosis present

## 2023-07-21 DIAGNOSIS — Z95818 Presence of other cardiac implants and grafts: Secondary | ICD-10-CM | POA: Insufficient documentation

## 2023-07-21 DIAGNOSIS — I2510 Atherosclerotic heart disease of native coronary artery without angina pectoris: Secondary | ICD-10-CM | POA: Diagnosis present

## 2023-07-21 DIAGNOSIS — Z6832 Body mass index (BMI) 32.0-32.9, adult: Secondary | ICD-10-CM | POA: Diagnosis not present

## 2023-07-21 DIAGNOSIS — I10 Essential (primary) hypertension: Secondary | ICD-10-CM | POA: Diagnosis present

## 2023-07-21 DIAGNOSIS — Z951 Presence of aortocoronary bypass graft: Secondary | ICD-10-CM | POA: Diagnosis not present

## 2023-07-21 DIAGNOSIS — Z23 Encounter for immunization: Secondary | ICD-10-CM | POA: Diagnosis not present

## 2023-07-21 DIAGNOSIS — Z7901 Long term (current) use of anticoagulants: Secondary | ICD-10-CM

## 2023-07-21 DIAGNOSIS — Z720 Tobacco use: Secondary | ICD-10-CM | POA: Diagnosis present

## 2023-07-21 DIAGNOSIS — E669 Obesity, unspecified: Secondary | ICD-10-CM | POA: Diagnosis present

## 2023-07-21 HISTORY — PX: TRANSESOPHAGEAL ECHOCARDIOGRAM (CATH LAB): EP1270

## 2023-07-21 HISTORY — DX: Presence of other cardiac implants and grafts: Z95.818

## 2023-07-21 HISTORY — PX: LEFT ATRIAL APPENDAGE OCCLUSION: EP1229

## 2023-07-21 LAB — POCT ACTIVATED CLOTTING TIME
Activated Clotting Time: 279 s
Activated Clotting Time: 314 s

## 2023-07-21 LAB — TYPE AND SCREEN
ABO/RH(D): O POS
Antibody Screen: NEGATIVE

## 2023-07-21 LAB — ECHO TEE

## 2023-07-21 LAB — SURGICAL PCR SCREEN
MRSA, PCR: NEGATIVE
Staphylococcus aureus: NEGATIVE

## 2023-07-21 SURGERY — LEFT ATRIAL APPENDAGE OCCLUSION
Anesthesia: General

## 2023-07-21 MED ORDER — ONDANSETRON HCL 4 MG/2ML IJ SOLN
INTRAMUSCULAR | Status: DC | PRN
  Administered 2023-07-21: 4 mg via INTRAVENOUS

## 2023-07-21 MED ORDER — CEFAZOLIN SODIUM-DEXTROSE 2-4 GM/100ML-% IV SOLN
2.0000 g | INTRAVENOUS | Status: AC
Start: 2023-07-21 — End: 2023-07-21
  Administered 2023-07-21: 2 g via INTRAVENOUS
  Filled 2023-07-21: qty 100

## 2023-07-21 MED ORDER — SUGAMMADEX SODIUM 200 MG/2ML IV SOLN
INTRAVENOUS | Status: DC | PRN
  Administered 2023-07-21: 200 mg via INTRAVENOUS

## 2023-07-21 MED ORDER — HEPARIN SODIUM (PORCINE) 1000 UNIT/ML IJ SOLN
INTRAMUSCULAR | Status: DC | PRN
  Administered 2023-07-21: 1000 [IU] via INTRAVENOUS
  Administered 2023-07-21: 16000 [IU] via INTRAVENOUS

## 2023-07-21 MED ORDER — INFLUENZA VAC A&B SURF ANT ADJ 0.5 ML IM SUSY
0.5000 mL | PREFILLED_SYRINGE | Freq: Once | INTRAMUSCULAR | 0 refills | Status: AC
Start: 2023-07-21 — End: 2023-07-21

## 2023-07-21 MED ORDER — SODIUM CHLORIDE 0.9 % IV SOLN: INTRAVENOUS | Status: DC

## 2023-07-21 MED ORDER — ONDANSETRON HCL 4 MG/2ML IJ SOLN
4.0000 mg | Freq: Four times a day (QID) | INTRAMUSCULAR | Status: DC | PRN
Start: 2023-07-21 — End: 2023-07-21

## 2023-07-21 MED ORDER — DEXAMETHASONE SODIUM PHOSPHATE 10 MG/ML IJ SOLN
INTRAMUSCULAR | Status: DC | PRN
  Administered 2023-07-21: 4 mg via INTRAVENOUS

## 2023-07-21 MED ORDER — ACETAMINOPHEN 325 MG PO TABS
650.0000 mg | ORAL_TABLET | ORAL | Status: DC | PRN
  Administered 2023-07-21: 650 mg via ORAL
  Filled 2023-07-21: qty 2

## 2023-07-21 MED ORDER — HYDROXYCHLOROQUINE SULFATE 200 MG PO TABS
400.0000 mg | ORAL_TABLET | Freq: Every day | ORAL | Status: DC
  Administered 2023-07-21: 400 mg via ORAL
  Filled 2023-07-21: qty 2

## 2023-07-21 MED ORDER — INFLUENZA VAC A&B SURF ANT ADJ 0.5 ML IM SUSY: 0.5000 mL | PREFILLED_SYRINGE | INTRAMUSCULAR | Status: DC

## 2023-07-21 MED ORDER — INFLUENZA VAC A&B SURF ANT ADJ 0.5 ML IM SUSY
0.5000 mL | PREFILLED_SYRINGE | Freq: Once | INTRAMUSCULAR | Status: AC
  Administered 2023-07-21: 0.5 mL via INTRAMUSCULAR
  Filled 2023-07-21: qty 0.5

## 2023-07-21 MED ORDER — EPHEDRINE SULFATE-NACL 50-0.9 MG/10ML-% IV SOSY
PREFILLED_SYRINGE | INTRAVENOUS | Status: DC | PRN
  Administered 2023-07-21: 15 mg via INTRAVENOUS
  Administered 2023-07-21: 10 mg via INTRAVENOUS

## 2023-07-21 MED ORDER — RIVAROXABAN 20 MG PO TABS: 20.0000 mg | ORAL_TABLET | Freq: Every day | ORAL | Status: DC

## 2023-07-21 MED ORDER — PROTAMINE SULFATE 10 MG/ML IV SOLN
INTRAVENOUS | Status: DC | PRN
Start: 2023-07-21 — End: 2023-07-21
  Administered 2023-07-21: 35 mg via INTRAVENOUS

## 2023-07-21 MED ORDER — CHLORHEXIDINE GLUCONATE 0.12 % MT SOLN
OROMUCOSAL | Status: AC
  Administered 2023-07-21: 15 mL via OROMUCOSAL
  Filled 2023-07-21: qty 15

## 2023-07-21 MED ORDER — CHLORHEXIDINE GLUCONATE 0.12 % MT SOLN
15.0000 mL | Freq: Once | OROMUCOSAL | Status: AC
  Filled 2023-07-21: qty 15

## 2023-07-21 MED ORDER — CARVEDILOL 12.5 MG PO TABS: 12.5000 mg | ORAL_TABLET | Freq: Two times a day (BID) | ORAL | Status: DC

## 2023-07-21 MED ORDER — PANTOPRAZOLE SODIUM 40 MG PO TBEC
40.0000 mg | DELAYED_RELEASE_TABLET | Freq: Every day | ORAL | Status: DC
  Administered 2023-07-21: 40 mg via ORAL
  Filled 2023-07-21: qty 1

## 2023-07-21 MED ORDER — ALBUMIN HUMAN 5 % IV SOLN: INTRAVENOUS | Status: DC | PRN

## 2023-07-21 MED ORDER — PROPOFOL 10 MG/ML IV BOLUS
INTRAVENOUS | Status: DC | PRN
  Administered 2023-07-21: 150 mg via INTRAVENOUS

## 2023-07-21 MED ORDER — HEPARIN (PORCINE) IN NACL 1000-0.9 UT/500ML-% IV SOLN
INTRAVENOUS | Status: DC | PRN
  Administered 2023-07-21 (×2): 500 mL

## 2023-07-21 MED ORDER — ROCURONIUM BROMIDE 10 MG/ML (PF) SYRINGE
PREFILLED_SYRINGE | INTRAVENOUS | Status: DC | PRN
  Administered 2023-07-21: 30 mg via INTRAVENOUS
  Administered 2023-07-21: 70 mg via INTRAVENOUS

## 2023-07-21 MED ORDER — PHENYLEPHRINE 80 MCG/ML (10ML) SYRINGE FOR IV PUSH (FOR BLOOD PRESSURE SUPPORT)
PREFILLED_SYRINGE | INTRAVENOUS | Status: DC | PRN
  Administered 2023-07-21: 80 ug via INTRAVENOUS
  Administered 2023-07-21: 160 ug via INTRAVENOUS

## 2023-07-21 MED ORDER — SODIUM CHLORIDE 0.9% FLUSH: 3.0000 mL | INTRAVENOUS | Status: DC | PRN

## 2023-07-21 MED ORDER — GABAPENTIN 100 MG PO CAPS: 100.0000 mg | ORAL_CAPSULE | Freq: Every day | ORAL | Status: DC

## 2023-07-21 MED ORDER — CHLORHEXIDINE GLUCONATE 4 % EX SOLN
Freq: Once | CUTANEOUS | Status: DC
  Filled 2023-07-21: qty 15

## 2023-07-21 MED ORDER — IOHEXOL 350 MG/ML SOLN
INTRAVENOUS | Status: DC | PRN
  Administered 2023-07-21: 20 mL

## 2023-07-21 MED ORDER — SODIUM CHLORIDE 0.9% FLUSH: 3.0000 mL | Freq: Two times a day (BID) | INTRAVENOUS | Status: DC

## 2023-07-21 MED ORDER — ROSUVASTATIN CALCIUM 20 MG PO TABS: 40.0000 mg | ORAL_TABLET | Freq: Every day | ORAL | Status: DC

## 2023-07-21 SURGICAL SUPPLY — 18 items
BLANKET WARM UNDERBOD FULL ACC (MISCELLANEOUS) ×1 IMPLANT
CATH DIAG 6FR PIGTAIL ANGLED (CATHETERS) IMPLANT
CATH WATCHMAN STEER ACCESS SYS (CATHETERS) IMPLANT
CLOSURE PERCLOSE PROSTYLE (VASCULAR PRODUCTS) IMPLANT
DEVICE WATCHMAN FLX PRO PROC (KITS) IMPLANT
DEVICE WATCHMAN TRUSTEER PROC (KITS) IMPLANT
DILATOR VESSEL 38 20CM 16FR (INTRODUCER) IMPLANT
ELECT DEFIB PAD ADLT CADENCE (PAD) IMPLANT
KIT HEART LEFT (KITS) ×1 IMPLANT
KIT SHEA VERSACROSS LAAC CONNE (KITS) IMPLANT
PACK CARDIAC CATHETERIZATION (CUSTOM PROCEDURE TRAY) ×1 IMPLANT
SHEATH PERFORMER 18FRX30 (VASCULAR PRODUCTS) IMPLANT
SHEATH PINNACLE 8F 10CM (SHEATH) IMPLANT
SHEATH PROBE COVER 6X72 (BAG) ×1 IMPLANT
TRANSDUCER W/STOPCOCK (MISCELLANEOUS) ×1 IMPLANT
WATCHMAN FLX PRO 31 (Prosthesis & Implant Heart) IMPLANT
WATCHMAN FLX PRO PROCEDURE (KITS) ×1 IMPLANT
WATCHMAN TRUSTEER PROCEDURE (KITS) ×1 IMPLANT

## 2023-07-21 NOTE — Discharge Summary (Cosign Needed)
[facility]    Patient ID: Cameron Blank.,  MRN: [id number], DOB/AGE: July 02, 1955 68 y.o.  Admit date: 07/21/2023 Discharge date: 07/21/2023  Primary Care Physician: [name]  Primary Cardiologist: Kristeen Miss, MD  Electrophysiologist: [name], MD  Primary Discharge Diagnosis:  Paroxysmal Atrial Fibrillation Poor candidacy for long term anticoagulation due to h/o GI bleeding  Procedures This Admission:  Transeptal Puncture Intra-procedural TEE which showed no LAA thrombus Left atrial appendage occlusive device placement on 07/21/23 by [name].   This study demonstrated:    CONCLUSIONS:  1.Successful implantation of a 31mm WATCHMAN Flx Pro left atrial appendage occlusive device    2. TEE demonstrating no LAA thrombus 3. No early apparent complications.    Post Implant Anticoagulation Strategy: Continue Eliquis for 45 days then transition to DAPT.   Brief HPI: Cameron Renfroe. is a 68 y.o. male with a history of PAF s/p PVI (09/2019), CAD s/p one-vessel CABG in 2013, HTN, former tobacco abuse, sleep apnea who presented to [facility] for elective LAAO 07/21/23.    Cameron Schultz is followed by Dr. Elease Hashimoto for his cardiology care. He has had several admission related to GI bleeding secondary to oral anticoagulation. He underwent EGD and was found to have a bleeding lesion in the duodenum which was clipped/cauterized. Given this and his high risk for recurrence, he was referred to [name] for [facility] candidacy evaluation and was felt to be a good candidate to proceed.   Hospital Course:  The patient was admitted and underwent left atrial appendage occlusive device placement with 31mm FLX Pro device.  He was monitored in the post procedure setting and has done very well with no concerns. Given this, he is being considered for same day discharge later today. Groin site has been stable without evidence of hematoma or bleeding. Wound care and  restrictions were reviewed with the patient. The patient has been scheduled for post procedure follow up with his healthcare provider in approximately 1 month. He will restart Xarelto this evening and continue for 45 days then stop. At that time he will transition to DAPT with ASA 81mg  and Plavix 75mg  daily to complete 6 months of therapy. He will require dental SBE during this time and should refrain from dental work or cleanings for at least 30 days post implant. SBE to be RXd at follow up. A repeat CT will be performed in approximately 60 days to ensure proper seal of the device.   CAD s/ one-vessel CABG in 2013: Denies anginal symptoms. Plan lifelong ASA after post LAAO therapy.    HTN: Well controlled. Resumed on home antihypertensives.    Tobacco Use: Reports cessation. Encouraged to continue.   Vitals:   07/21/23 1230 07/21/23 1245 07/21/23 1304 07/21/23 1340  BP: 107/77 123/77 (!) 142/90 (!) 145/88  Pulse: 80 73 66 75  Resp: 13 17 (!) 22 18  Temp:    97.7 F (36.5 C)  TempSrc:    Oral  SpO2: 95% 96% 96% 97%  Weight:      Height:       Labs:   Lab Results  Component Value Date   WBC 4.0 07/13/2023   HGB 10.4 (L) 07/13/2023   HCT 34.4 (L) 07/13/2023   MCV 77 (L) 07/13/2023   PLT 189 07/13/2023   No results for input(s): "NA", "K", "CL", "CO2", "BUN", "CREATININE", "CALCIUM", "PROT", "BILITOT", "ALKPHOS", "ALT", "AST", "GLUCOSE" in the last 168 hours.  Invalid input(s): "  LABALBU"  Discharge Medications:  Allergies as of 07/21/2023   No Known Allergies      Medication List     TAKE these medications    acetaminophen 325 MG tablet Commonly known as: TYLENOL Take 650 mg by mouth every 6 (six) hours as needed for moderate pain.   augmented betamethasone dipropionate 0.05 % cream Commonly known as: DIPROLENE-AF Apply 1 Application topically 2 (two) times daily as needed (irritation).   carvedilol 25 MG tablet Commonly known as: COREG Take 12.5 mg by mouth 2 (two)  times daily with a meal.   diclofenac Sodium 1 % Gel Commonly known as: VOLTAREN Apply 1 application  topically daily as needed (pain).   gabapentin 100 MG capsule Commonly known as: NEURONTIN Take 100 mg by mouth at bedtime.   hydroxychloroquine 200 MG tablet Commonly known as: PLAQUENIL Take 400 mg by mouth daily.   influenza vaccine adjuvanted 0.5 ML injection Commonly known as: FLUAD Inject 0.5 mLs into the muscle once for 1 dose.   Ipratropium-Albuterol 20-100 MCG/ACT Aers respimat Commonly known as: COMBIVENT Inhale 1 puff into the lungs 4 (four) times daily as needed for shortness of breath.   Melatonin 3 MG Caps Take 3 mg by mouth at bedtime.   NONFORMULARY OR COMPOUNDED ITEM 0.5-1 mLs by Intracavernosal route daily as needed (ED). Prostaglandin 10mg /mL Papaverine 30mg /mL Phentolamine 1mg /mL   Ozempic (2 MG/DOSE) 8 MG/3ML Sopn Generic drug: Semaglutide (2 MG/DOSE) Inject 2 mg into the skin once a week.   pantoprazole 40 MG tablet Commonly known as: PROTONIX Take 40 mg by mouth daily.   polyethylene glycol 17 g packet Commonly known as: MIRALAX / GLYCOLAX Take 17 g by mouth daily as needed for moderate constipation.   rivaroxaban 20 MG Tabs tablet Commonly known as: XARELTO Take 1 tablet (20 mg total) by mouth daily with breakfast. Notes to patient: Restart Xarelto this evening, 07/21/23   rosuvastatin 40 MG tablet Commonly known as: CRESTOR Take 40 mg by mouth at bedtime.        Disposition:  Home  Discharge Instructions     Call MD for:  difficulty breathing, headache or visual disturbances   Complete by: As directed    Call MD for:  persistant nausea and vomiting   Complete by: As directed    Call MD for:  redness, tenderness, or signs of infection (pain, swelling, redness, odor or green/yellow discharge around incision site)   Complete by: As directed    Call MD for:  severe uncontrolled pain   Complete by: As directed    Call MD for:   temperature >100.4   Complete by: As directed    Diet - low sodium heart healthy   Complete by: As directed    Discharge instructions   Complete by: As directed    RESTART XARELTO TONIGHT 07/21/23!!!!   [facility] Procedure, Care After  Procedure MD: Dr. Alene Mires Clinical Coordinator: Karsten Fells, RN  This sheet gives you information about how to care for yourself after your procedure. Your health care provider may also give you more specific instructions. If you have problems or questions, contact your health care provider.  What can I expect after the procedure? After the procedure, it is common to have: Bruising around your puncture site. Tenderness around your puncture site. Tiredness (fatigue).  Medication instructions It is very important to continue to take your blood thinner as directed by your doctor after the Watchman procedure. Call your procedure doctor's office with question  or concerns. If you are on Coumadin (warfarin), you will have your INR checked the week after your procedure, with a goal INR of 2.0 - 3.0. Please follow your medication instructions on your discharge summary. Only take the medications listed on your discharge paperwork.  Follow up You will be seen in 1 month after your procedure You will have a repeat CT scan approximately 8 weeks after your procedure mark to check your device You will follow up the MD/APP who performed your procedure 6 months after your procedure The Watchman Clinical Coordinator will check in with you from time to time, including 1 and 2 years after your procedure.    Follow these instructions at home: Puncture site care  Follow instructions from your health care provider about how to take care of your puncture site. Make sure you: If present, leave stitches (sutures), skin glue, or adhesive strips in place.  If a large square bandage is present, this may be removed 24 hours after surgery.  Check your puncture site every  day for signs of infection. Check for: Redness, swelling, or pain. Fluid or blood. If your puncture site starts to bleed, lie down on your back, apply firm pressure to the area, and contact your health care provider. Warmth. Pus or a bad smell. Driving Do not drive yourself home if you received sedation Do not drive for at least 4 days after your procedure or however long your health care provider recommends. (Do not resume driving if you have previously been instructed not to drive for other health reasons.) Do not spend greater than 1 hour at a time in a car for the first 3 days. Stop and take a break with a 5 minute walk at least every hour.  Do not drive or use heavy machinery while taking prescription pain medicine.  Activity Avoid activities that take a lot of effort, including exercise, for at least 7 days after your procedure. For the first 3 days, avoid sitting for longer than one hour at a time.  Avoid alcoholic beverages, signing paperwork, or participating in legal proceedings for 24 hours after receiving sedation Do not lift anything that is heavier than 10 lb (4.5 kg) for one week.  No sexual activity for 1 week.  Return to your normal activities as told by your health care provider. Ask your health care provider what activities are safe for you. General instructions Take over-the-counter and prescription medicines only as told by your health care provider. Do not use any products that contain nicotine or tobacco, such as cigarettes and e-cigarettes. If you need help quitting, ask your health care provider. You may shower after 24 hours, but Do not take baths, swim, or use a hot tub for 1 week.  Do not drink alcohol for 24 hours after your procedure. Keep all follow-up visits as told by your health care provider. This is important. Dental Work: You will require antibiotics prior to any dental work, including cleanings, for 6 months after your Watchman implantation to help  protect you from infection. After 6 months, antibiotics are no longer required. Contact a health care provider if: You have redness, mild swelling, or pain around your puncture site. You have soreness in your throat or at your puncture site that does not improve after several days You have fluid or blood coming from your puncture site that stops after applying firm pressure to the area. Your puncture site feels warm to the touch. You have pus or a bad smell  coming from your puncture site. You have a fever. You have chest pain or discomfort that spreads to your neck, jaw, or arm. You are sweating a lot. You feel nauseous. You have a fast or irregular heartbeat. You have shortness of breath. You are dizzy or light-headed and feel the need to lie down. You have pain or numbness in the arm or leg closest to your puncture site. Get help right away if: Your puncture site suddenly swells. Your puncture site is bleeding and the bleeding does not stop after applying firm pressure to the area. These symptoms may represent a serious problem that is an emergency. Do not wait to see if the symptoms will go away. Get medical help right away. Call your local emergency services (911 in the [place] Do not drive yourself to the hospital. Summary After the procedure, it is normal to have bruising and tenderness at the puncture site in your groin, neck, or forearm. Check your puncture site every day for signs of infection. Get help right away if your puncture site is bleeding and the bleeding does not stop after applying firm pressure to the area. This is a medical emergency.  This information is not intended to replace advice given to you by your health care provider. Make sure you discuss any questions you have with your health care provider.   Increase activity slowly   Complete by: As directed        Follow-up Information     Fenton, Clint R, PA Follow up on 08/29/2023.   Specialty: Cardiology Why:  Your appointment is scheduled for 08/29/23 at 10AM. Please plan to arrive by 9:45AM Contact information: [address] [place] [address] [phone]                 SignedGeorgie Chard, NP  07/21/2023 3:04 PM  I have seen, examined the patient, and reviewed the above assessment and plan.    Hospital Course: Patient admitted on 2/20 for Watchman implant. Underwent successful implant of a 31mm Watchman Flx Pro device. Patient underwent standard monitoring post procedure with no acute complications. He felt well with no complaints and was discharged home.   GEN: No acute distress.   Cardiac: Normal rate, regular rhythm. Resp: Normal work of breathing.  R groin: Soft without hematoma or bleeding.   Psych: Normal affect   Plan: Patient will continue Eliquis for 45 days then transition to DAPT. Repeat CT scan in 60 days to assess for device leak.   Duration of Discharge Encounter:  APP Time: 15 minues MD Time: 20 minutes  [name], MD 07/22/2023 3:23 PM

## 2023-07-21 NOTE — Discharge Instructions (Signed)
 WATCHMANT Procedure, Care After  Procedure MD: Dr. Alene Mires Clinical Coordinator: Karsten Fells, RN  This sheet gives you information about how to care for yourself after your procedure. Your health care provider may also give you more specific instructions. If you have problems or questions, contact your health care provider.  What can I expect after the procedure? After the procedure, it is common to have: Bruising around your puncture site. Tenderness around your puncture site. Tiredness (fatigue).  Medication instructions It is very important to continue to take your blood thinner as directed by your doctor after the Watchman procedure. Call your procedure doctor's office with question or concerns. If you are on Coumadin (warfarin), you will have your INR checked the week after your procedure, with a goal INR of 2.0 - 3.0. Please follow your medication instructions on your discharge summary. Only take the medications listed on your discharge paperwork.  Follow up You will be seen in 1 month after your procedure You will have a repeat CT scan approximately 8 weeks after your procedure mark to check your device You will follow up the MD/APP who performed your procedure 6 months after your procedure The Watchman Clinical Coordinator will check in with you from time to time, including 1 and 2 years after your procedure.    Follow these instructions at home: Puncture site care  Follow instructions from your health care provider about how to take care of your puncture site. Make sure you: If present, leave stitches (sutures), skin glue, or adhesive strips in place.  If a large square bandage is present, this may be removed 24 hours after surgery.  Check your puncture site every day for signs of infection. Check for: Redness, swelling, or pain. Fluid or blood. If your puncture site starts to bleed, lie down on your back, apply firm pressure to the area, and contact your health care  provider. Warmth. Pus or a bad smell. Driving Do not drive yourself home if you received sedation Do not drive for at least 4 days after your procedure or however long your health care provider recommends. (Do not resume driving if you have previously been instructed not to drive for other health reasons.) Do not spend greater than 1 hour at a time in a car for the first 3 days. Stop and take a break with a 5 minute walk at least every hour.  Do not drive or use heavy machinery while taking prescription pain medicine.  Activity Avoid activities that take a lot of effort, including exercise, for at least 7 days after your procedure. For the first 3 days, avoid sitting for longer than one hour at a time.  Avoid alcoholic beverages, signing paperwork, or participating in legal proceedings for 24 hours after receiving sedation Do not lift anything that is heavier than 10 lb (4.5 kg) for one week.  No sexual activity for 1 week.  Return to your normal activities as told by your health care provider. Ask your health care provider what activities are safe for you. General instructions Take over-the-counter and prescription medicines only as told by your health care provider. Do not use any products that contain nicotine or tobacco, such as cigarettes and e-cigarettes. If you need help quitting, ask your health care provider. You may shower after 24 hours, but Do not take baths, swim, or use a hot tub for 1 week.  Do not drink alcohol for 24 hours after your procedure. Keep all follow-up visits as told by your  health care provider. This is important. Dental Work: You will require antibiotics prior to any dental work, including cleanings, for 6 months after your Watchman implantation to help protect you from infection. After 6 months, antibiotics are no longer required. Contact a health care provider if: You have redness, mild swelling, or pain around your puncture site. You have soreness in your  throat or at your puncture site that does not improve after several days You have fluid or blood coming from your puncture site that stops after applying firm pressure to the area. Your puncture site feels warm to the touch. You have pus or a bad smell coming from your puncture site. You have a fever. You have chest pain or discomfort that spreads to your neck, jaw, or arm. You are sweating a lot. You feel nauseous. You have a fast or irregular heartbeat. You have shortness of breath. You are dizzy or light-headed and feel the need to lie down. You have pain or numbness in the arm or leg closest to your puncture site. Get help right away if: Your puncture site suddenly swells. Your puncture site is bleeding and the bleeding does not stop after applying firm pressure to the area. These symptoms may represent a serious problem that is an emergency. Do not wait to see if the symptoms will go away. Get medical help right away. Call your local emergency services (911 in the [place] Do not drive yourself to the hospital. Summary After the procedure, it is normal to have bruising and tenderness at the puncture site in your groin, neck, or forearm. Check your puncture site every day for signs of infection. Get help right away if your puncture site is bleeding and the bleeding does not stop after applying firm pressure to the area. This is a medical emergency.  This information is not intended to replace advice given to you by your health care provider. Make sure you discuss any questions you have with your health care provider.

## 2023-07-21 NOTE — Anesthesia Procedure Notes (Signed)
Procedure Name: Intubation Date/Time: 07/21/2023 10:18 AM  Performed by: Sandie Ano, CRNAPre-anesthesia Checklist: Patient identified, Emergency Drugs available, Suction available and Patient being monitored Patient Re-evaluated:Patient Re-evaluated prior to induction Oxygen Delivery Method: Circle System Utilized Preoxygenation: Pre-oxygenation with 100% oxygen Induction Type: IV induction Ventilation: Mask ventilation without difficulty Laryngoscope Size: Mac Grade View: Grade II Tube type: Oral Tube size: 7.5 mm Number of attempts: 1 Airway Equipment and Method: Stylet and Oral airway Placement Confirmation: ETT inserted through vocal cords under direct vision, positive ETCO2 and breath sounds checked- equal and bilateral Secured at: 23 cm Tube secured with: Tape Dental Injury: Teeth and Oropharynx as per pre-operative assessment

## 2023-07-21 NOTE — Anesthesia Postprocedure Evaluation (Signed)
Anesthesia Post Note  Patient: Cameron Schultz.  Procedure(s) Performed: LEFT ATRIAL APPENDAGE OCCLUSION TRANSESOPHAGEAL ECHOCARDIOGRAM     Patient location during evaluation: PACU Anesthesia Type: General Level of consciousness: awake and alert Pain management: pain level controlled Vital Signs Assessment: post-procedure vital signs reviewed and stable Respiratory status: spontaneous breathing, nonlabored ventilation, respiratory function stable and patient connected to nasal cannula oxygen Cardiovascular status: blood pressure returned to baseline and stable Postop Assessment: no apparent nausea or vomiting Anesthetic complications: no   There were no known notable events for this encounter.  Last Vitals:  Vitals:   07/21/23 1015 07/21/23 1140  BP:  119/75  Pulse:  74  Resp:  16  Temp:    SpO2: 100% 93%    Last Pain:  Vitals:   07/21/23 1140  TempSrc:   PainSc: 5                  [place]

## 2023-07-21 NOTE — Anesthesia Preprocedure Evaluation (Signed)
Anesthesia Evaluation  Patient identified by MRN, date of birth, ID band Patient awake    Reviewed: Allergy & Precautions, NPO status , Patient's Chart, lab work & pertinent test results  Airway Mallampati: II  TM Distance: >3 FB Neck ROM: Full    Dental  (+) Dental Advisory Given   Pulmonary sleep apnea , Patient abstained from smoking., former smoker   Pulmonary exam normal breath sounds clear to auscultation       Cardiovascular hypertension, Pt. on home beta blockers + CAD, + Past MI and + CABG  + dysrhythmias Atrial Fibrillation  Rhythm:Regular Rate:Normal  TTE 2021  1. Left ventricular ejection fraction, by estimation, is 45 to 50%. The  left ventricle has mildly decreased function. The left ventricle  demonstrates regional wall motion abnormalities (see scoring  diagram/findings for description). There is moderate  left ventricular hypertrophy. Left ventricular diastolic parameters are  consistent with Grade I diastolic dysfunction (impaired relaxation). There  is moderate hypokinesis of the left ventricular, basal-mid inferior wall.   2. Right ventricular systolic function is mildly reduced. The right  ventricular size is normal.   3. Left atrial size was severely dilated.   4. The mitral valve is abnormal. Mild mitral valve regurgitation.   5. The aortic valve is tricuspid. Aortic valve regurgitation is not  visualized.   6. The inferior vena cava is dilated in size with >50% respiratory  variability, suggesting right atrial pressure of 8 mmHg     Neuro/Psych  Neuromuscular disease    GI/Hepatic negative GI ROS, Neg liver ROS,,,  Endo/Other  negative endocrine ROS    Renal/GU negative Renal ROS     Musculoskeletal  (+) Arthritis ,    Abdominal  (+) + obese  Peds  Hematology  (+) Blood dyscrasia, anemia   Anesthesia Other Findings   Reproductive/Obstetrics                               Anesthesia Physical Anesthesia Plan  ASA: 4  Anesthesia Plan: General   Post-op Pain Management:    Induction: Intravenous  PONV Risk Score and Plan: 2 and Ondansetron, Dexamethasone and Treatment may vary due to age or medical condition  Airway Management Planned: Oral ETT  Additional Equipment:   Intra-op Plan:   Post-operative Plan: Extubation in OR  Informed Consent: I have reviewed the patients History and Physical, chart, labs and discussed the procedure including the risks, benefits and alternatives for the proposed anesthesia with the patient or authorized representative who has indicated his/her understanding and acceptance.     Dental advisory given  Plan Discussed with: CRNA  Anesthesia Plan Comments:          Anesthesia Quick Evaluation

## 2023-07-21 NOTE — Progress Notes (Signed)
  [facility]   Watchman Team  Patient doing well s/p LAAO. He is hemodynamically stable. Groin site remains stable. Plan to transfer to from cath lab holding to 2c, 4e, or 6e when bed available. Post instructions reviewed with all questions answered. Early ambulation after bedrest completed and hopeful discharge later today.   Georgie Chard NP-C Structural Heart Team  Phone: [phone]

## 2023-07-21 NOTE — Transfer of Care (Signed)
Immediate Anesthesia Transfer of Care Note  Patient: Cameron Schultz.  Procedure(s) Performed: LEFT ATRIAL APPENDAGE OCCLUSION TRANSESOPHAGEAL ECHOCARDIOGRAM  Patient Location: Cath Lab  Anesthesia Type:General  Level of Consciousness: awake, alert , and oriented  Airway & Oxygen Therapy: Patient Spontanous Breathing  Post-op Assessment: Report given to RN and Post -op Vital signs reviewed and stable  Post vital signs: Reviewed and stable  Last Vitals:  Vitals Value Taken Time  BP    Temp    Pulse    Resp    SpO2      Last Pain:  Vitals:   07/21/23 0807  TempSrc:   PainSc: 0-No pain         Complications: There were no known notable events for this encounter.

## 2023-07-21 NOTE — Interval H&P Note (Signed)
History and Physical Interval Note:  07/21/2023 9:53 AM  Cameron Schultz.  has presented today for surgery, with the diagnosis of atrial fibrillation and recurrent GI bleeding.  The various methods of treatment have been discussed with the patient and family. After consideration of risks, benefits and other options for treatment, the patient has consented to  Procedure(s): LEFT ATRIAL APPENDAGE OCCLUSION (N/A) TRANSESOPHAGEAL ECHOCARDIOGRAM (N/A) as a surgical intervention.  The patient's history has been reviewed, patient examined, no change in status, stable for surgery.  I have reviewed the patient's chart and labs.  Questions were answered to the patient's satisfaction.     [name]

## 2023-07-22 ENCOUNTER — Telehealth: Payer: Self-pay

## 2023-07-22 ENCOUNTER — Encounter (HOSPITAL_COMMUNITY): Payer: Self-pay | Admitting: Cardiology

## 2023-07-22 DIAGNOSIS — Z95818 Presence of other cardiac implants and grafts: Secondary | ICD-10-CM

## 2023-07-22 DIAGNOSIS — I4891 Unspecified atrial fibrillation: Secondary | ICD-10-CM

## 2023-07-22 NOTE — Telephone Encounter (Signed)
Watchman TOC call attempted.  Left message that he will be called Monday and if he has any issues over the weekend to call the [facility] office number.

## 2023-07-25 NOTE — Addendum Note (Signed)
 Addended by: Gunnar Fusi A on: 07/25/2023 02:58 PM   Modules accepted: Orders

## 2023-07-25 NOTE — Telephone Encounter (Signed)
  [facility]   Watchman Team  Contacted the patient regarding discharge from [facility] on 07/21/2023  The patient understands to follow up with Otilio Saber on 08/29/2023  The patient understands discharge instructions? Yes  The patient understands medications and regimen? Yes   The patient reports groin site looks healthy with no S/S of infection or bleeding.  The patient understands to call with any questions or concerns prior to scheduled visit.

## 2023-07-26 ENCOUNTER — Telehealth: Payer: Self-pay | Admitting: Cardiology

## 2023-07-26 ENCOUNTER — Encounter: Payer: Self-pay | Admitting: Cardiology

## 2023-07-26 NOTE — Telephone Encounter (Signed)
 Patient had the watchman procedure on 07/21/23 and he wants to know if he has any driving restrictions.

## 2023-07-26 NOTE — Telephone Encounter (Signed)
 Spoke with patient and he states he had watchman on  07/21/23. He would like to know if you feel its safe for him to drive over 962 miles this weekend. He wants to go out of town

## 2023-07-26 NOTE — Telephone Encounter (Signed)
 See MyChart message. Okay to drive per [name].

## 2023-07-28 NOTE — Telephone Encounter (Signed)
 Spoke with patient and he is aware to stop every hour and walk around. He Verbalized understanding

## 2023-08-09 ENCOUNTER — Emergency Department (HOSPITAL_COMMUNITY)

## 2023-08-09 ENCOUNTER — Encounter (HOSPITAL_COMMUNITY): Payer: Self-pay

## 2023-08-09 ENCOUNTER — Emergency Department (HOSPITAL_COMMUNITY): Admission: EM | Admit: 2023-08-09 | Discharge: 2023-08-09 | Disposition: A | Discharge status: Home / Self Care

## 2023-08-09 ENCOUNTER — Other Ambulatory Visit: Payer: Self-pay

## 2023-08-09 DIAGNOSIS — Z7901 Long term (current) use of anticoagulants: Secondary | ICD-10-CM | POA: Diagnosis not present

## 2023-08-09 DIAGNOSIS — M545 Low back pain, unspecified: Secondary | ICD-10-CM

## 2023-08-09 DIAGNOSIS — Z79899 Other long term (current) drug therapy: Secondary | ICD-10-CM | POA: Insufficient documentation

## 2023-08-09 DIAGNOSIS — I2510 Atherosclerotic heart disease of native coronary artery without angina pectoris: Secondary | ICD-10-CM | POA: Insufficient documentation

## 2023-08-09 DIAGNOSIS — I10 Essential (primary) hypertension: Secondary | ICD-10-CM | POA: Insufficient documentation

## 2023-08-09 DIAGNOSIS — I4891 Unspecified atrial fibrillation: Secondary | ICD-10-CM | POA: Insufficient documentation

## 2023-08-09 LAB — CBC
HCT: 33.8 % — ABNORMAL LOW (ref 39.0–52.0)
Hemoglobin: 10.5 g/dL — ABNORMAL LOW (ref 13.0–17.0)
MCH: 23 pg — ABNORMAL LOW (ref 26.0–34.0)
MCHC: 31.1 g/dL (ref 30.0–36.0)
MCV: 74.1 fL — ABNORMAL LOW (ref 80.0–100.0)
Platelets: 196 10*3/uL (ref 150–400)
RBC: 4.56 MIL/uL (ref 4.22–5.81)
RDW: 20.7 % — ABNORMAL HIGH (ref 11.5–15.5)
WBC: 5.5 10*3/uL (ref 4.0–10.5)
nRBC: 0 % (ref 0.0–0.2)

## 2023-08-09 LAB — URINALYSIS, ROUTINE W REFLEX MICROSCOPIC
Bacteria, UA: NONE SEEN
Bilirubin Urine: NEGATIVE
Glucose, UA: NEGATIVE mg/dL
Hgb urine dipstick: NEGATIVE
Ketones, ur: NEGATIVE mg/dL
Leukocytes,Ua: NEGATIVE
Nitrite: NEGATIVE
Protein, ur: 100 mg/dL — AB
Specific Gravity, Urine: 1.024 (ref 1.005–1.030)
pH: 5 (ref 5.0–8.0)

## 2023-08-09 LAB — BASIC METABOLIC PANEL
Anion gap: 10 (ref 5–15)
BUN: 11 mg/dL (ref 8–23)
CO2: 22 mmol/L (ref 22–32)
Calcium: 8.8 mg/dL — ABNORMAL LOW (ref 8.9–10.3)
Chloride: 105 mmol/L (ref 98–111)
Creatinine, Ser: 0.94 mg/dL (ref 0.61–1.24)
GFR, Estimated: >60 mL/min (ref >60)
Glucose, Bld: 95 mg/dL (ref 70–99)
Potassium: 4.2 mmol/L (ref 3.5–5.1)
Sodium: 137 mmol/L (ref 135–145)

## 2023-08-09 MED ORDER — METHOCARBAMOL 500 MG PO TABS: 500.0000 mg | ORAL_TABLET | Freq: Four times a day (QID) | ORAL | 0 refills | Status: AC

## 2023-08-09 MED ORDER — METHOCARBAMOL 500 MG PO TABS: 500.0000 mg | ORAL_TABLET | Freq: Once | ORAL | Status: DC

## 2023-08-09 MED ORDER — HYDROCODONE-ACETAMINOPHEN 5-325 MG PO TABS: 1.0000 | ORAL_TABLET | ORAL | 0 refills | Status: AC | PRN

## 2023-08-09 MED ORDER — HYDROCODONE-ACETAMINOPHEN 5-325 MG PO TABS
1.0000 | ORAL_TABLET | Freq: Once | ORAL | Status: AC
  Administered 2023-08-09: 1 via ORAL
  Filled 2023-08-09: qty 1

## 2023-08-09 NOTE — ED Provider Notes (Signed)
 [place] EMERGENCY DEPARTMENT AT [facility] Provider Note   CSN: [id number] Arrival date & time: 08/09/23  4098     History  Chief Complaint  Patient presents with   Flank Pain    Cameron Schultz. is a 68 y.o. male.  Pt complains of pain in right low back and flank area. Pt reports pain began last month and has continued.  Pt reports he has had a renal biopsy in the past.  Pt reports he was tested for kidney disease.  Pt is worried that he has a kidney problem.  Pt reports pain is worse with moving.  No numbness or weakness.  No leg pain patient denies any burning with urination he has not had any fever or chills.  Patient has a past medical history of A-fib coronary artery disease hypertension.  He is currently on Xarelto.  [facility]  The history is provided by the patient. No language interpreter was used.  Flank Pain This is a new problem.       Home Medications Prior to Admission medications   Medication Sig Start Date End Date Taking? Authorizing Provider  HYDROcodone-acetaminophen (NORCO/VICODIN) 5-325 MG tablet Take 1 tablet by mouth every 4 (four) hours as needed. 08/09/23 08/08/24 Yes Elson Areas, PA-C  methocarbamol (ROBAXIN) 500 MG tablet Take 1 tablet (500 mg total) by mouth 4 (four) times daily. 08/09/23  Yes Elson Areas, PA-C  acetaminophen (TYLENOL) 325 MG tablet Take 650 mg by mouth every 6 (six) hours as needed for moderate pain. 12/01/18   [provider]  augmented betamethasone dipropionate (DIPROLENE-AF) 0.05 % cream Apply 1 Application topically 2 (two) times daily as needed (irritation). 12/15/21   [provider]  carvedilol (COREG) 25 MG tablet Take 12.5 mg by mouth 2 (two) times daily with a meal.    [provider]  diclofenac Sodium (VOLTAREN) 1 % GEL Apply 1 application  topically daily as needed (pain).    [provider]  gabapentin (NEURONTIN) 100 MG capsule Take 100 mg by  mouth at bedtime.    [provider]  hydroxychloroquine (PLAQUENIL) 200 MG tablet Take 400 mg by mouth daily.    [provider]  Ipratropium-Albuterol (COMBIVENT) 20-100 MCG/ACT AERS respimat Inhale 1 puff into the lungs 4 (four) times daily as needed for shortness of breath.    [provider]  Melatonin 3 MG CAPS Take 3 mg by mouth at bedtime.    [provider]  NONFORMULARY OR COMPOUNDED ITEM 0.5-1 mLs by Intracavernosal route daily as needed (ED). Prostaglandin 10mg /mL Papaverine 30mg /mL Phentolamine 1mg /mL    [provider]  pantoprazole (PROTONIX) 40 MG tablet Take 40 mg by mouth daily.    [provider]  polyethylene glycol (MIRALAX / GLYCOLAX) 17 g packet Take 17 g by mouth daily as needed for moderate constipation.    [provider]  rivaroxaban (XARELTO) 20 MG TABS tablet Take 1 tablet (20 mg total) by mouth daily with breakfast. 03/29/23   Evette Georges, MD  rosuvastatin (CRESTOR) 40 MG tablet Take 40 mg by mouth at bedtime.    [provider]  Semaglutide, 2 MG/DOSE, (OZEMPIC, 2 MG/DOSE,) 8 MG/3ML SOPN Inject 2 mg into the skin once a week.    [provider]      Allergies    Patient has no known allergies.    Review of Systems   Review of Systems  Genitourinary:  Positive  for flank pain.  All other systems reviewed and are negative.   Physical Exam Updated Vital Signs BP (!) 131/99 (BP Location: Right Arm)   Pulse 72   Temp 98.7 F (37.1 C)   Resp 16   Ht 6\' 1"  (1.854 m)   Wt 111.1 kg   SpO2 100%   BMI 32.32 kg/m  Physical Exam Vitals and nursing note reviewed.  Constitutional:      Appearance: He is well-developed.  HENT:     Head: Normocephalic.  Cardiovascular:     Rate and Rhythm: Normal rate.  Pulmonary:     Effort: Pulmonary effort is normal.  Abdominal:     General: Abdomen is flat. There is no distension.  Musculoskeletal:        General: Normal range of motion.      Cervical back: Normal range of motion.  Skin:    General: Skin is warm.  Neurological:     General: No focal deficit present.     Mental Status: He is alert and oriented to person, place, and time.     ED Results / Procedures / Treatments   Labs (all labs ordered are listed, but only abnormal results are displayed) Labs Reviewed  URINALYSIS, ROUTINE W REFLEX MICROSCOPIC - Abnormal; Notable for the following components:      Result Value   APPearance HAZY (*)    Protein, ur 100 (*)    All other components within normal limits  CBC - Abnormal; Notable for the following components:   Hemoglobin 10.5 (*)    HCT 33.8 (*)    MCV 74.1 (*)    MCH 23.0 (*)    RDW 20.7 (*)    All other components within normal limits  BASIC METABOLIC PANEL - Abnormal; Notable for the following components:   Calcium 8.8 (*)    All other components within normal limits    EKG None  Radiology CT Renal Stone Study Result Date: 08/09/2023 CLINICAL DATA:  Abdominal and flank pain, right-sided. EXAM: CT ABDOMEN AND PELVIS WITHOUT CONTRAST TECHNIQUE: Multidetector CT imaging of the abdomen and pelvis was performed following the standard protocol without IV contrast. RADIATION DOSE REDUCTION: This exam was performed according to the departmental dose-optimization program which includes automated exposure control, adjustment of the mA and/or kV according to patient size and/or use of iterative reconstruction technique. COMPARISON:  CT abdomen and pelvis 02/08/2023 FINDINGS: Lower chest: No acute abnormality. Hepatobiliary: No focal liver abnormality is seen. No gallstones, gallbladder wall thickening, or biliary dilatation. Pancreas: Unremarkable. No pancreatic ductal dilatation or surrounding inflammatory changes. Spleen: Normal in size without focal abnormality. Adrenals/Urinary Tract: There is a cyst in the right kidney measuring 13 mm. Otherwise, the adrenal glands, kidneys and bladder are within normal  limits. Stomach/Bowel: Stomach is within normal limits. Appendix appears normal. No evidence of bowel wall thickening, distention, or inflammatory changes. Vascular/Lymphatic: Aortic atherosclerosis. No enlarged abdominal or pelvic lymph nodes. Reproductive: Prostate is unremarkable. Other: There is a small fat containing umbilical hernia. There is no ascites. Musculoskeletal: Multilevel degenerative changes affect the spine. Left hip arthroplasty is present. IMPRESSION: 1. No acute localizing process in the abdomen or pelvis. 2. Right renal cyst.  No follow-up imaging recommended. 3. Aortic atherosclerosis. Aortic Atherosclerosis (ICD10-I70.0). Electronically Signed   By: Darliss Cheney M.D.   On: 08/09/2023 19:42    Procedures Procedures    Medications Ordered in ED Medications  HYDROcodone-acetaminophen (NORCO/VICODIN) 5-325 MG per tablet 1 tablet (has no administration in time  range)  methocarbamol (ROBAXIN) tablet 500 mg (has no administration in time range)    ED Course/ Medical Decision Making/ A&P                                 Medical Decision Making Patient complains of pain in his right side.  Patient is worried that he could have a kidney problem.  Patient has had pain for 1-1/2 months.  Amount and/or Complexity of Data Reviewed Independent Historian: spouse    Details: Patient is here with his wife who is supportive Labs: ordered. Decision-making details documented in ED Course.    Details: Labs ordered reviewed and interpreted UA does show protein.  Hemoglobin is 10.5.  BUN and creatinine are normal Radiology: ordered and independent interpretation performed. Decision-making details documented in ED Course.    Details: CT renal shows no acute kidney stone or renal disease.  Risk Prescription drug management. Risk Details: Patient is counseled on findings.  He is given a prescription for Robaxin and hydrocodone for pain he is [facility]  return to the emergency department if any problems.           Final Clinical Impression(s) / ED Diagnoses Final diagnoses:  Acute right-sided low back pain without sciatica    Rx / DC Orders ED Discharge Orders          Ordered    methocarbamol (ROBAXIN) 500 MG tablet  4 times daily        08/09/23 2008    HYDROcodone-acetaminophen (NORCO/VICODIN) 5-325 MG tablet  Every 4 hours PRN        08/09/23 2008           An After Visit Summary was printed and given to the patient.    Elson Areas, PA-C 08/09/23 2044    Coral Spikes, DO 08/09/23 2354

## 2023-08-09 NOTE — ED Triage Notes (Signed)
 Pt came in via POV d/t last month and a half having lower-Rt sided back/flank pain. Endorses is is progressively getting worse, has to sleep flat on his back & cannot lay on his side. Does not know if he has blood in his urine or not. A/Ox4, rates pain 8/10 during triage.

## 2023-08-09 NOTE — Discharge Instructions (Addendum)
 Follow up with your Physicain for recheck

## 2023-08-28 NOTE — Progress Notes (Unsigned)
  Electrophysiology Office Note:   Date:  08/29/2023  ID:  Cameron Croak., DOB [birth year], MRN [id number]  Primary Cardiologist: Kristeen Miss, MD Electrophysiologist: [name], MD      History of Present Illness:   Cameron Broaddus. is a 68 y.o. male with h/o  paroxysmal atrial fibrillation status post PVI (09/2019), coronary artery disease status post one-vessel CABG in 2013, hypertension, former tobacco abuse, sleep apnea and s/p Watchman 07/2023 seen today for routine electrophysiology followup.   Since last being seen in our clinic the patient reports doing very well. Overall, he denies chest pain, palpitations, dyspnea, PND, orthopnea, nausea, vomiting, dizziness, syncope, edema, weight gain, or early satiety.  He has already received ASA + Plavix from the [place] and understands to start them on Sunday.  Review of systems complete and found to be negative unless listed in HPI.   EP Information / Studies Reviewed:    EKG is ordered today. Personal review as below.  EKG Interpretation Date/Time:  Monday August 29 2023 08:29:03 EDT Ventricular Rate:  77 PR Interval:  180 QRS Duration:  102 QT Interval:  402 QTC Calculation: 454 R Axis:   -11  Text Interpretation: Sinus rhythm with occasional Premature ventricular complexes When compared with ECG of 13-Jul-2023 09:29, Premature ventricular complexes are now Present Criteria for Inferior infarct are no longer Present Confirmed by Cameron Schultz [phone]) on 08/29/2023 8:53:44 AM    Arrhythmia/Device History S/p PVI 09/2019 (JA) S/p Watchman 07/21/23 (JP)   Physical Exam:   VS:  BP 120/82 (BP Location: Left Arm, Patient Position: Sitting, Cuff Size: Normal)   Pulse 78   Resp 16   Ht 6\' 1"  (1.854 m)   Wt 253 lb (114.8 kg)   SpO2 98%   BMI 33.38 kg/m    Wt Readings from Last 3 Encounters:  08/29/23 253 lb (114.8 kg)  08/09/23 245 lb (111.1 kg)  07/21/23 245 lb (111.1 kg)     GEN: No acute distress NECK: No JVD; No  carotid bruits CARDIAC: Regular rate and rhythm, no murmurs, rubs, gallops RESPIRATORY:  Clear to auscultation without rales, wheezing or rhonchi  ABDOMEN: Soft, non-tender, non-distended EXTREMITIES:  No edema; No deformity   ASSESSMENT AND PLAN:    Persistent AF S/p ablation 09/2019 S/p Watchman 07/21/23 Pending CT 09/21/2023 Last dose of Xarelto, Saturday 4/5. Start 81 mg ASA daily and 75 mg plavix daily on 4/6. CHA2DS2VASc of at least 3  Dental prophylaxis given today. Understands he will not need after the 6 month mark.   Secondary hypercoagulable state Plan as above given h/o Watchman  OSA  Encouraged nightly CPAP  HTN Stable on current regimen    Follow up with EP APP  in August for 6 month post op check.    Signed, Graciella Freer, PA-C

## 2023-08-29 ENCOUNTER — Encounter: Payer: Self-pay | Admitting: Student

## 2023-08-29 ENCOUNTER — Ambulatory Visit (HOSPITAL_COMMUNITY): Payer: No Typology Code available for payment source | Admitting: Physician Assistant

## 2023-08-29 ENCOUNTER — Ambulatory Visit: Payer: No Typology Code available for payment source | Attending: Student | Admitting: Student

## 2023-08-29 VITALS — BP 120/82 | HR 78 | Resp 16 | Ht 73.0 in | Wt 253.0 lb

## 2023-08-29 DIAGNOSIS — I4891 Unspecified atrial fibrillation: Secondary | ICD-10-CM | POA: Diagnosis not present

## 2023-08-29 DIAGNOSIS — Z95818 Presence of other cardiac implants and grafts: Secondary | ICD-10-CM

## 2023-08-29 DIAGNOSIS — I10 Essential (primary) hypertension: Secondary | ICD-10-CM | POA: Diagnosis not present

## 2023-08-29 DIAGNOSIS — I2510 Atherosclerotic heart disease of native coronary artery without angina pectoris: Secondary | ICD-10-CM

## 2023-08-29 MED ORDER — AMOXICILLIN 500 MG PO TABS: ORAL_TABLET | ORAL | 1 refills | Status: AC

## 2023-08-29 MED ORDER — AMOXICILLIN 500 MG PO TABS
ORAL_TABLET | ORAL | 1 refills | Status: DC
Start: 2023-08-29 — End: 2023-08-29

## 2023-08-29 NOTE — Patient Instructions (Signed)
 Medication Instructions:  1.Stop Xarelto on Saturday 09/03/23 2.Start Aspirin 81 mg and plavix 75 mg daily on Sunday 09/04/23 3.Take amoxicillin 4 tablets (2000 mg) 1 hr prior to dental cleanings *If you need a refill on your cardiac medications before your next appointment, please call your pharmacy*  Lab Work: None ordered If you have labs (blood work) drawn today and your tests are completely normal, you will receive your results only by: MyChart Message (if you have MyChart) OR A paper copy in the mail If you have any lab test that is abnormal or we need to change your treatment, we will call you to review the results.  Follow-Up: At [facility], you and your health needs are our priority.  As part of our continuing mission to provide you with exceptional heart care, our providers are all part of one team.  This team includes your primary Cardiologist (physician) and Advanced Practice Providers or APPs (Physician Assistants and Nurse Practitioners) who all work together to provide you with the care you need, when you need it.  Your next appointment:   August 2025  Provider:   Casimiro Needle "Mardelle Matte" [place], [place]       1st Floor: - Lobby - Registration  - Pharmacy  - Lab - Cafe  2nd Floor: - PV Lab - Diagnostic Testing (echo, CT, nuclear med)  3rd Floor: - Vacant  4th Floor: - TCTS (cardiothoracic surgery) - AFib Clinic - Structural Heart Clinic - Vascular Surgery  - Vascular Ultrasound  5th Floor: - [facility] (general and EP) - Clinical Pharmacy for coumadin, hypertension, lipid, weight-loss medications, and med management appointments    Valet parking services will be available as well.

## 2023-09-21 ENCOUNTER — Ambulatory Visit (HOSPITAL_COMMUNITY)
Admission: RE | Admit: 2023-09-21 | Discharge: 2023-09-21 | Disposition: A | Discharge status: Home / Self Care | Payer: No Typology Code available for payment source | Source: Ambulatory Visit | Attending: Internal Medicine | Admitting: Internal Medicine

## 2023-09-21 DIAGNOSIS — I4891 Unspecified atrial fibrillation: Secondary | ICD-10-CM | POA: Insufficient documentation

## 2023-09-21 DIAGNOSIS — Z951 Presence of aortocoronary bypass graft: Secondary | ICD-10-CM | POA: Insufficient documentation

## 2023-09-21 DIAGNOSIS — Q2112 Patent foramen ovale: Secondary | ICD-10-CM | POA: Diagnosis not present

## 2023-09-21 DIAGNOSIS — Z95818 Presence of other cardiac implants and grafts: Secondary | ICD-10-CM | POA: Insufficient documentation

## 2023-09-21 MED ORDER — IOHEXOL 350 MG/ML SOLN: 100.0000 mL | Freq: Once | INTRAVENOUS | Status: DC | PRN

## 2023-09-21 MED ORDER — IOHEXOL 350 MG/ML SOLN
100.0000 mL | Freq: Once | INTRAVENOUS | Status: AC | PRN
  Administered 2023-09-21: 100 mL via INTRAVENOUS

## 2023-09-23 ENCOUNTER — Encounter: Payer: Self-pay | Admitting: Cardiology

## 2023-09-29 NOTE — Telephone Encounter (Signed)
 Reviewed results with patient who verbalized understanding.   Confirmed the patient has STOPPED XARELTO  and STARTED ASA and PLAVIX as directed. Med list updated. Discussed aortic dilatation - [facility] Confirmed 01/20/2024 visit. The patient was grateful for call and agreed with plan.

## 2023-10-31 DIAGNOSIS — B360 Pityriasis versicolor: Secondary | ICD-10-CM | POA: Diagnosis not present

## 2023-10-31 DIAGNOSIS — L2089 Other atopic dermatitis: Secondary | ICD-10-CM | POA: Diagnosis not present

## 2024-01-09 DIAGNOSIS — H40033 Anatomical narrow angle, bilateral: Secondary | ICD-10-CM | POA: Diagnosis not present

## 2024-01-09 DIAGNOSIS — H2513 Age-related nuclear cataract, bilateral: Secondary | ICD-10-CM | POA: Diagnosis not present

## 2024-01-17 NOTE — Progress Notes (Deleted)
  Electrophysiology Office Note:   Date:  01/18/2024  ID:  Cameron Schultz., DOB May 03, 1956, MRN [id number]  Primary Cardiologist: Aleene Passe, MD (Inactive) Electrophysiologist: Fonda Kitty, MD  {Click to update primary MD,subspecialty MD or APP then REFRESH:1}    History of Present Illness:   Cameron Schultz. is a 68 y.o. male with h/o paroxysmal atrial fibrillation status post PVI (09/2019), coronary artery disease status post one-vessel CABG in 2013, hypertension, former tobacco abuse, sleep apnea and s/p Watchman 07/2023  seen today for routine electrophysiology follow-up s/p Watchman implantation.  Since last being seen in our clinic the patient reports doing ***.  he denies chest pain, palpitations, dyspnea, PND, orthopnea, nausea, vomiting, dizziness, syncope, edema, weight gain, or early satiety.    Review of systems complete and found to be negative unless listed in HPI.   EP Information / Studies Reviewed:    EKG is not ordered today. EKG from 08/29/2023 reviewed which showed NSR with PVCs   Arrhythmia/Device History S/p PVI 09/2019 (JA) S/p Watchman 07/21/23 (JP)   Physical Exam:   VS:  There were no vitals taken for this visit.   Wt Readings from Last 3 Encounters:  08/29/23 253 lb (114.8 kg)  08/09/23 245 lb (111.1 kg)  07/21/23 245 lb (111.1 kg)     GEN: No acute distress NECK: No JVD; No carotid bruits CARDIAC: {EPRHYTHM:28826}, no murmurs, rubs, gallops RESPIRATORY:  Clear to auscultation without rales, wheezing or rhonchi  ABDOMEN: Soft, non-tender, non-distended EXTREMITIES:  {EDEMA LEVEL:28147::No} edema; No deformity   ASSESSMENT AND PLAN:    Persistent Atrial fibrillation S/p ablation 09/30/2019 S/p Watchman 07/2023 CT 09/21/2023 with device in good position with no evidence of leak or Clot.  Now 6 months out *** plavix and CONTINUE 81 mg daily ASA given CAD s/p CABG  Secondary hypercoagulable state Plan as above  OSA  Encouraged nightly  CPAP  HTN Stable on current regimen   {Click here to Review PMH, Prob List, Meds, Allergies, SHx, FHx  :1}   Follow up with EP Team {EPFOLLOW LE:71826}  Signed, Ozell Prentice Passey, PA-C

## 2024-01-19 NOTE — Progress Notes (Unsigned)
  Electrophysiology Office Note:   Date:  01/20/2024  ID:  Cameron DELENA Sharlyn Mickey., DOB 1956-04-04, MRN [id number]  Primary Cardiologist: Aleene Passe, MD (Inactive) Electrophysiologist: Fonda Kitty, MD      History of Present Illness:   Cameron Stillings. is a 68 y.o. male with h/o paroxysmal atrial fibrillation status post PVI (09/2019), coronary artery disease status post one-vessel CABG in 2013, hypertension, former tobacco abuse, sleep apnea and s/p Watchman 07/2023  seen today for routine electrophysiology follow-up s/p Watchman implantation.  Since last being seen in our clinic the patient reports doing very well. Otherwise, he denies chest pain, palpitations, dyspnea, PND, orthopnea, nausea, vomiting, dizziness, syncope, edema, weight gain, or early satiety.    Review of systems complete and found to be negative unless listed in HPI.   EP Information / Studies Reviewed:    EKG is not ordered today. EKG from 08/29/2023 reviewed which showed NSR with PVCs   Arrhythmia/Device History S/p PVI 09/2019 (JA) S/p Watchman 07/21/23 (JP)   Physical Exam:   VS:  BP 110/77   Pulse 75   Ht 6' 1 (1.854 m)   Wt 242 lb (109.8 kg)   SpO2 99%   BMI 31.93 kg/m    Wt Readings from Last 3 Encounters:  01/20/24 242 lb (109.8 kg)  08/29/23 253 lb (114.8 kg)  08/09/23 245 lb (111.1 kg)     GEN: No acute distress NECK: No JVD; No carotid bruits CARDIAC: Regular rate and rhythm, no murmurs, rubs, gallops RESPIRATORY:  Clear to auscultation without rales, wheezing or rhonchi  ABDOMEN: Soft, non-tender, non-distended EXTREMITIES:  No edema; No deformity   ASSESSMENT AND PLAN:    Persistent Atrial fibrillation S/p ablation 09/30/2019 S/p Watchman 07/2023 CT 09/21/2023 with device in good position with no evidence of leak or Clot.  Now 6 months out STOP plavix and CONTINUE 81 mg daily ASA given CAD s/p CABG  Secondary hypercoagulable state Plan as above  OSA  Encouraged nightly  CPAP  HTN Stable on current regimen   Follow up with EP Team in 6 months  Signed, Ozell Prentice Passey, PA-C

## 2024-01-20 ENCOUNTER — Ambulatory Visit: Attending: Student | Admitting: Student

## 2024-01-20 ENCOUNTER — Encounter: Payer: Self-pay | Admitting: Student

## 2024-01-20 ENCOUNTER — Ambulatory Visit: Admitting: Student

## 2024-01-20 VITALS — BP 110/77 | HR 75 | Ht 73.0 in | Wt 242.0 lb

## 2024-01-20 DIAGNOSIS — Z95818 Presence of other cardiac implants and grafts: Secondary | ICD-10-CM

## 2024-01-20 DIAGNOSIS — I10 Essential (primary) hypertension: Secondary | ICD-10-CM

## 2024-01-20 DIAGNOSIS — I4891 Unspecified atrial fibrillation: Secondary | ICD-10-CM

## 2024-01-20 DIAGNOSIS — I2510 Atherosclerotic heart disease of native coronary artery without angina pectoris: Secondary | ICD-10-CM

## 2024-01-20 NOTE — Patient Instructions (Signed)
 Medication Instructions:  Stop plavix *If you need a refill on your cardiac medications before your next appointment, please call your pharmacy*  Lab Work: None ordered If you have labs (blood work) drawn today and your tests are completely normal, you will receive your results only by: MyChart Message (if you have MyChart) OR A paper copy in the mail If you have any lab test that is abnormal or we need to change your treatment, we will call you to review the results.  Follow-Up: At [facility], you and your health needs are our priority.  As part of our continuing mission to provide you with exceptional heart care, our providers are all part of one team.  This team includes your primary Cardiologist (physician) and Advanced Practice Providers or APPs (Physician Assistants and Nurse Practitioners) who all work together to provide you with the care you need, when you need it.  Your next appointment:   6 month(s)  Provider:   You may see Fonda Kitty, MD or one of the following Advanced Practice Providers on your designated Care Team:   Charlies Arthur, PA-C Michael Andy Tillery, PA-C Suzann Riddle, NP Daphne Barrack, NP

## 2024-02-03 ENCOUNTER — Ambulatory Visit: Admitting: Student

## 2024-05-22 ENCOUNTER — Telehealth: Payer: Self-pay

## 2024-05-22 NOTE — Telephone Encounter (Signed)
 Left voicemail for patient to return call to see how he is doing s/p LAAO 07/21/2023.

## 2024-05-28 NOTE — Telephone Encounter (Signed)
Left second voicemail for patient to return call

## 2024-06-05 NOTE — Telephone Encounter (Signed)
 Patient returned call.  He had LAAO on 07/21/2023. He is currently on 81mg  ASA. The patient reports doing well with no issues.  Arranged routine f/u with Dr. Kennyth. The patient understands to call with questions or concerns.

## 2024-06-05 NOTE — Telephone Encounter (Signed)
 Left 3rd voicemail for patient. Will mail letter.

## 2024-07-24 ENCOUNTER — Ambulatory Visit: Admitting: Cardiology
